# Patient Record
Sex: Male | Born: 1940 | Race: White | Hispanic: No | Smoking: Never smoker
Health system: Southern US, Community
[De-identification: ages and names within clinical notes are randomized; demographics above are authoritative.]

## PROBLEM LIST (undated history)

## (undated) DIAGNOSIS — C9110 Chronic lymphocytic leukemia of B-cell type not having achieved remission: Secondary | ICD-10-CM

## (undated) DIAGNOSIS — I639 Cerebral infarction, unspecified: Secondary | ICD-10-CM

## (undated) DIAGNOSIS — E785 Hyperlipidemia, unspecified: Secondary | ICD-10-CM

## (undated) DIAGNOSIS — M1990 Unspecified osteoarthritis, unspecified site: Secondary | ICD-10-CM

## (undated) DIAGNOSIS — Z86010 Personal history of colonic polyps: Secondary | ICD-10-CM

## (undated) DIAGNOSIS — R600 Localized edema: Secondary | ICD-10-CM

## (undated) DIAGNOSIS — N189 Chronic kidney disease, unspecified: Secondary | ICD-10-CM

## (undated) DIAGNOSIS — R7302 Impaired glucose tolerance (oral): Secondary | ICD-10-CM

## (undated) HISTORY — DX: Hyperlipidemia, unspecified: E78.5

## (undated) HISTORY — DX: Impaired glucose tolerance (oral): R73.02

## (undated) HISTORY — DX: Chronic lymphocytic leukemia of B-cell type not having achieved remission: C91.10

## (undated) HISTORY — DX: Localized edema: R60.0

## (undated) HISTORY — DX: Unspecified osteoarthritis, unspecified site: M19.90

## (undated) HISTORY — DX: Cerebral infarction, unspecified: I63.9

## (undated) HISTORY — PX: COLONOSCOPY W/ BIOPSIES: SHX1374

## (undated) HISTORY — DX: Chronic kidney disease, unspecified: N18.9

## (undated) HISTORY — DX: Personal history of colonic polyps: Z86.010

---

## 2000-09-01 DIAGNOSIS — C9110 Chronic lymphocytic leukemia of B-cell type not having achieved remission: Secondary | ICD-10-CM

## 2000-09-01 HISTORY — DX: Chronic lymphocytic leukemia of B-cell type not having achieved remission: C91.10

## 2001-04-15 ENCOUNTER — Other Ambulatory Visit: Admission: RE | Admit: 2001-04-15 | Discharge: 2001-04-15 | Payer: Self-pay | Admitting: Oncology

## 2004-07-07 ENCOUNTER — Ambulatory Visit: Payer: Self-pay | Admitting: Oncology

## 2005-02-06 ENCOUNTER — Ambulatory Visit: Payer: Self-pay | Admitting: Oncology

## 2005-02-07 ENCOUNTER — Other Ambulatory Visit: Admission: RE | Admit: 2005-02-07 | Discharge: 2005-02-07 | Payer: Self-pay | Admitting: Oncology

## 2005-07-31 ENCOUNTER — Ambulatory Visit: Payer: Self-pay | Admitting: Oncology

## 2006-01-25 ENCOUNTER — Ambulatory Visit: Payer: Self-pay | Admitting: Oncology

## 2006-01-30 LAB — CBC WITH DIFFERENTIAL/PLATELET
Basophils Absolute: 0.1 10*3/uL (ref 0.0–0.1)
EOS%: 1.1 % (ref 0.0–7.0)
HCT: 46.3 % (ref 38.7–49.9)
HGB: 15.6 g/dL (ref 13.0–17.1)
LYMPH%: 76.4 % — ABNORMAL HIGH (ref 14.0–48.0)
MCH: 28.3 pg (ref 28.0–33.4)
MCV: 84 fL (ref 81.6–98.0)
MONO%: 4.4 % (ref 0.0–13.0)
NEUT%: 17.7 % — ABNORMAL LOW (ref 40.0–75.0)
Platelets: 188 10*3/uL (ref 145–400)

## 2006-02-03 LAB — COMPREHENSIVE METABOLIC PANEL
BUN: 18 mg/dL (ref 6–23)
CO2: 24 mEq/L (ref 19–32)
Glucose, Bld: 105 mg/dL — ABNORMAL HIGH (ref 70–99)
Sodium: 138 mEq/L (ref 135–145)
Total Bilirubin: 0.7 mg/dL (ref 0.3–1.2)
Total Protein: 6.4 g/dL (ref 6.0–8.3)

## 2006-02-03 LAB — PROTEIN ELECTROPHORESIS, SERUM
Alpha-2-Globulin: 10 % (ref 7.1–11.8)
Total Protein, Serum Electrophoresis: 6.4 g/dL (ref 6.0–8.3)

## 2006-02-03 LAB — LACTATE DEHYDROGENASE: LDH: 146 U/L (ref 94–250)

## 2006-07-29 ENCOUNTER — Ambulatory Visit: Payer: Self-pay | Admitting: Oncology

## 2006-07-31 LAB — ERYTHROCYTE SEDIMENTATION RATE: Sed Rate: 1 mm/hr (ref 0–20)

## 2006-07-31 LAB — CBC WITH DIFFERENTIAL/PLATELET
BASO%: 0.2 % (ref 0.0–2.0)
EOS%: 0.4 % (ref 0.0–7.0)
MCH: 28.5 pg (ref 28.0–33.4)
MCHC: 34.3 g/dL (ref 32.0–35.9)
NEUT%: 21.5 % — ABNORMAL LOW (ref 40.0–75.0)
RBC: 5.39 10*6/uL (ref 4.20–5.71)
RDW: 13.8 % (ref 11.2–14.6)
lymph#: 13.4 10*3/uL — ABNORMAL HIGH (ref 0.9–3.3)

## 2006-07-31 LAB — MORPHOLOGY: RBC Comments: NORMAL

## 2006-08-05 LAB — COMPREHENSIVE METABOLIC PANEL
Albumin: 4.2 g/dL (ref 3.5–5.2)
CO2: 24 mEq/L (ref 19–32)
Glucose, Bld: 128 mg/dL — ABNORMAL HIGH (ref 70–99)
Sodium: 139 mEq/L (ref 135–145)
Total Bilirubin: 0.7 mg/dL (ref 0.3–1.2)
Total Protein: 6.2 g/dL (ref 6.0–8.3)

## 2006-08-05 LAB — IMMUNOFIXATION ELECTROPHORESIS
IgM, Serum: 47 mg/dL — ABNORMAL LOW (ref 60–263)
Total Protein, Serum Electrophoresis: 6.2 g/dL (ref 6.0–8.3)

## 2006-08-05 LAB — LACTATE DEHYDROGENASE: LDH: 127 U/L (ref 94–250)

## 2007-02-03 ENCOUNTER — Ambulatory Visit: Payer: Self-pay | Admitting: Oncology

## 2007-02-05 LAB — CBC WITH DIFFERENTIAL/PLATELET
Eosinophils Absolute: 0.1 10*3/uL (ref 0.0–0.5)
MONO#: 0.7 10*3/uL (ref 0.1–0.9)
NEUT#: 4.1 10*3/uL (ref 1.5–6.5)
Platelets: 201 10*3/uL (ref 145–400)
RBC: 5.24 10*6/uL (ref 4.20–5.71)
RDW: 14.1 % (ref 11.2–14.6)
WBC: 18.8 10*3/uL — ABNORMAL HIGH (ref 4.0–10.0)

## 2007-02-05 LAB — MORPHOLOGY
PLT EST: ADEQUATE
RBC Comments: NORMAL

## 2007-02-09 LAB — COMPREHENSIVE METABOLIC PANEL
ALT: 18 U/L (ref 0–53)
Alkaline Phosphatase: 66 U/L (ref 39–117)
Sodium: 141 mEq/L (ref 135–145)
Total Bilirubin: 0.8 mg/dL (ref 0.3–1.2)
Total Protein: 6.5 g/dL (ref 6.0–8.3)

## 2007-02-09 LAB — PROTEIN ELECTROPHORESIS, SERUM
Beta 2: 2.9 % — ABNORMAL LOW (ref 3.2–6.5)
Beta Globulin: 6.1 % (ref 4.7–7.2)

## 2007-02-18 ENCOUNTER — Encounter: Payer: Self-pay | Admitting: Internal Medicine

## 2007-02-18 ENCOUNTER — Ambulatory Visit: Payer: Self-pay | Admitting: Internal Medicine

## 2007-02-18 DIAGNOSIS — E785 Hyperlipidemia, unspecified: Secondary | ICD-10-CM

## 2007-03-12 ENCOUNTER — Ambulatory Visit: Payer: Self-pay | Admitting: Internal Medicine

## 2007-03-26 ENCOUNTER — Encounter: Payer: Self-pay | Admitting: Internal Medicine

## 2007-03-26 ENCOUNTER — Ambulatory Visit: Payer: Self-pay | Admitting: Internal Medicine

## 2007-03-26 DIAGNOSIS — Z860100 Personal history of colon polyps, unspecified: Secondary | ICD-10-CM

## 2007-03-26 HISTORY — DX: Personal history of colonic polyps: Z86.010

## 2007-03-26 HISTORY — DX: Personal history of colon polyps, unspecified: Z86.0100

## 2007-08-27 ENCOUNTER — Ambulatory Visit: Payer: Self-pay | Admitting: Oncology

## 2007-08-31 LAB — CBC WITH DIFFERENTIAL/PLATELET
BASO%: 0.3 % (ref 0.0–2.0)
EOS%: 0.4 % (ref 0.0–7.0)
MCH: 28.8 pg (ref 28.0–33.4)
MCHC: 34.1 g/dL (ref 32.0–35.9)
MCV: 84.3 fL (ref 81.6–98.0)
MONO%: 2.6 % (ref 0.0–13.0)
RBC: 5.39 10*6/uL (ref 4.20–5.71)
RDW: 13.7 % (ref 11.2–14.6)

## 2007-08-31 LAB — COMPREHENSIVE METABOLIC PANEL
ALT: 35 U/L (ref 0–53)
Alkaline Phosphatase: 74 U/L (ref 39–117)
Creatinine, Ser: 1.33 mg/dL (ref 0.40–1.50)
Sodium: 142 mEq/L (ref 135–145)
Total Bilirubin: 0.7 mg/dL (ref 0.3–1.2)
Total Protein: 6.7 g/dL (ref 6.0–8.3)

## 2007-08-31 LAB — MORPHOLOGY: RBC Comments: NORMAL

## 2008-02-11 ENCOUNTER — Ambulatory Visit: Payer: Self-pay | Admitting: Internal Medicine

## 2008-02-11 LAB — CONVERTED CEMR LAB
Albumin: 3.8 g/dL (ref 3.5–5.2)
Alkaline Phosphatase: 64 units/L (ref 39–117)
Bilirubin, Direct: 0.1 mg/dL (ref 0.0–0.3)
Blood in Urine, dipstick: NEGATIVE
Eosinophils Absolute: 0.2 10*3/uL (ref 0.0–0.7)
GFR calc Af Amer: 78 mL/min
GFR calc non Af Amer: 64 mL/min
HCT: 44.9 % (ref 39.0–52.0)
HDL: 42.2 mg/dL (ref >39.0)
Ketones, urine, test strip: NEGATIVE
MCV: 86.6 fL (ref 78.0–100.0)
Monocytes Absolute: 1.6 10*3/uL — ABNORMAL HIGH (ref 0.1–1.0)
Nitrite: NEGATIVE
Platelets: 178 10*3/uL (ref 150–400)
Potassium: 4.2 meq/L (ref 3.5–5.1)
Protein, U semiquant: NEGATIVE
RDW: 13 % (ref 11.5–14.6)
Sodium: 142 meq/L (ref 135–145)
Specific Gravity, Urine: >=1.03
Total CHOL/HDL Ratio: 3.6
Triglycerides: 71 mg/dL (ref 0–149)
pH: 5

## 2008-02-18 ENCOUNTER — Ambulatory Visit: Payer: Self-pay | Admitting: Internal Medicine

## 2008-02-18 DIAGNOSIS — N400 Enlarged prostate without lower urinary tract symptoms: Secondary | ICD-10-CM

## 2008-02-18 DIAGNOSIS — C9110 Chronic lymphocytic leukemia of B-cell type not having achieved remission: Secondary | ICD-10-CM | POA: Insufficient documentation

## 2008-02-28 ENCOUNTER — Ambulatory Visit: Payer: Self-pay | Admitting: Oncology

## 2008-03-01 LAB — CBC & DIFF AND RETIC
Eosinophils Absolute: 0.1 10*3/uL (ref 0.0–0.5)
LYMPH%: 66.3 % — ABNORMAL HIGH (ref 14.0–48.0)
MCH: 28.9 pg (ref 28.0–33.4)
MCHC: 34.4 g/dL (ref 32.0–35.9)
MCV: 83.9 fL (ref 81.6–98.0)
MONO%: 3.3 % (ref 0.0–13.0)
NEUT#: 5.9 10*3/uL (ref 1.5–6.5)
Platelets: 196 10*3/uL (ref 145–400)
RBC: 5.48 10*6/uL (ref 4.20–5.71)
Retic %: 1.8 % (ref 0.7–2.3)

## 2008-03-06 LAB — IMMUNOFIXATION ELECTROPHORESIS
IgA: 100 mg/dL (ref 68–378)
IgG (Immunoglobin G), Serum: 953 mg/dL (ref 694–1618)
IgM, Serum: 49 mg/dL — ABNORMAL LOW (ref 60–263)
Total Protein, Serum Electrophoresis: 6.7 g/dL (ref 6.0–8.3)

## 2008-03-06 LAB — COMPREHENSIVE METABOLIC PANEL
Albumin: 4.4 g/dL (ref 3.5–5.2)
Alkaline Phosphatase: 70 U/L (ref 39–117)
BUN: 20 mg/dL (ref 6–23)
Calcium: 9.2 mg/dL (ref 8.4–10.5)
Chloride: 109 mEq/L (ref 96–112)
Glucose, Bld: 119 mg/dL — ABNORMAL HIGH (ref 70–99)
Potassium: 4.3 mEq/L (ref 3.5–5.3)

## 2008-04-03 ENCOUNTER — Encounter: Payer: Self-pay | Admitting: Internal Medicine

## 2008-06-06 ENCOUNTER — Encounter (INDEPENDENT_AMBULATORY_CARE_PROVIDER_SITE_OTHER): Payer: Self-pay

## 2008-09-27 ENCOUNTER — Ambulatory Visit: Payer: Self-pay | Admitting: Oncology

## 2008-09-29 LAB — CBC WITH DIFFERENTIAL/PLATELET
Basophils Absolute: 0 10*3/uL (ref 0.0–0.1)
EOS%: 0.6 % (ref 0.0–7.0)
HGB: 15.4 g/dL (ref 13.0–17.1)
MCH: 29.4 pg (ref 28.0–33.4)
NEUT#: 5.1 10*3/uL (ref 1.5–6.5)
RDW: 13.9 % (ref 11.2–14.6)
WBC: 16.3 10*3/uL — ABNORMAL HIGH (ref 4.0–10.0)
lymph#: 10.5 10*3/uL — ABNORMAL HIGH (ref 0.9–3.3)

## 2008-10-03 LAB — COMPREHENSIVE METABOLIC PANEL
ALT: 23 U/L (ref 0–53)
AST: 20 U/L (ref 0–37)
Albumin: 4.1 g/dL (ref 3.5–5.2)
BUN: 20 mg/dL (ref 6–23)
Calcium: 9 mg/dL (ref 8.4–10.5)
Chloride: 106 mEq/L (ref 96–112)
Potassium: 4.3 mEq/L (ref 3.5–5.3)

## 2008-10-03 LAB — PROTEIN ELECTROPHORESIS, SERUM
Beta 2: 3.3 % (ref 3.2–6.5)
Gamma Globulin: 12.7 % (ref 11.1–18.8)

## 2008-10-06 ENCOUNTER — Encounter: Payer: Self-pay | Admitting: Internal Medicine

## 2008-10-09 LAB — CBC & DIFF AND RETIC
Basophils Absolute: 0 10*3/uL (ref 0.0–0.1)
Eosinophils Absolute: 0.2 10*3/uL (ref 0.0–0.5)
HCT: 47.4 % (ref 38.7–49.9)
HGB: 16.2 g/dL (ref 13.0–17.1)
IRF: 0.37 (ref 0.070–0.380)
MCV: 85.8 fL (ref 81.6–98.0)
MONO%: 3.8 % (ref 0.0–13.0)
NEUT#: 3.8 10*3/uL (ref 1.5–6.5)
NEUT%: 21.2 % — ABNORMAL LOW (ref 40.0–75.0)
RDW: 13.8 % (ref 11.2–14.6)
RETIC #: 96.2 10*3/uL (ref 31.8–103.9)
lymph#: 13.4 10*3/uL — ABNORMAL HIGH (ref 0.9–3.3)

## 2008-10-09 LAB — COMPREHENSIVE METABOLIC PANEL
ALT: 22 U/L (ref 0–53)
BUN: 16 mg/dL (ref 6–23)
CO2: 25 mEq/L (ref 19–32)
Calcium: 9.4 mg/dL (ref 8.4–10.5)
Chloride: 106 mEq/L (ref 96–112)
Creatinine, Ser: 1.34 mg/dL (ref 0.40–1.50)
Glucose, Bld: 95 mg/dL (ref 70–99)

## 2008-10-09 LAB — LACTATE DEHYDROGENASE: LDH: 127 U/L (ref 94–250)

## 2008-10-11 LAB — CREATININE CLEARANCE, URINE, 24 HOUR
Creatinine Clearance: 117 mL/min (ref 75–125)
Urine Total Volume-CRCL: 1950 mL

## 2008-10-11 LAB — UIFE/LIGHT CHAINS/TP QN, 24-HR UR
Albumin, U: DETECTED
Alpha 1, Urine: DETECTED — AB
Alpha 2, Urine: DETECTED — AB
Free Kappa Lt Chains,Ur: 0.63 mg/dL (ref 0.04–1.51)
Free Kappa/Lambda Ratio: 12.6 ratio — ABNORMAL HIGH (ref 0.46–4.00)
Free Lambda Excretion/Day: 0.98 mg/d
Time: 24 hours
Total Protein, Urine-Ur/day: 21 mg/d (ref 10–140)
Total Protein, Urine: 1.1 mg/dL

## 2008-10-27 ENCOUNTER — Encounter: Payer: Self-pay | Admitting: Internal Medicine

## 2008-12-28 ENCOUNTER — Encounter: Payer: Self-pay | Admitting: Internal Medicine

## 2009-01-25 ENCOUNTER — Encounter: Payer: Self-pay | Admitting: Internal Medicine

## 2009-04-04 ENCOUNTER — Ambulatory Visit: Payer: Self-pay | Admitting: Oncology

## 2009-04-04 LAB — CBC & DIFF AND RETIC
Basophils Absolute: 0 10*3/uL (ref 0.0–0.1)
EOS%: 1.2 % (ref 0.0–7.0)
Eosinophils Absolute: 0.2 10*3/uL (ref 0.0–0.5)
HGB: 15.8 g/dL (ref 13.0–17.1)
MCH: 28.5 pg (ref 27.2–33.4)
MONO#: 0.9 10*3/uL (ref 0.1–0.9)
NEUT#: 3.4 10*3/uL (ref 1.5–6.5)
RDW: 13.8 % (ref 11.0–14.6)
WBC: 16.6 10*3/uL — ABNORMAL HIGH (ref 4.0–10.3)
lymph#: 12.1 10*3/uL — ABNORMAL HIGH (ref 0.9–3.3)

## 2009-04-04 LAB — RETICULOCYTES (CHCC): RBC.: 5.63 MIL/uL (ref 4.22–5.81)

## 2009-04-04 LAB — COMPREHENSIVE METABOLIC PANEL
ALT: 17 U/L (ref 0–53)
Albumin: 4.2 g/dL (ref 3.5–5.2)
CO2: 21 mEq/L (ref 19–32)
Potassium: 4.5 mEq/L (ref 3.5–5.3)
Sodium: 139 mEq/L (ref 135–145)
Total Bilirubin: 0.6 mg/dL (ref 0.3–1.2)
Total Protein: 6.6 g/dL (ref 6.0–8.3)

## 2009-04-04 LAB — LACTATE DEHYDROGENASE: LDH: 138 U/L (ref 94–250)

## 2009-04-04 LAB — CHCC SMEAR

## 2009-04-13 ENCOUNTER — Encounter: Payer: Self-pay | Admitting: Internal Medicine

## 2009-06-15 ENCOUNTER — Ambulatory Visit: Payer: Self-pay | Admitting: Family Medicine

## 2009-06-15 LAB — CONVERTED CEMR LAB
ALT: 24 units/L (ref 0–53)
AST: 25 units/L (ref 0–37)
Albumin: 4 g/dL (ref 3.5–5.2)
BUN: 16 mg/dL (ref 6–23)
Bilirubin Urine: NEGATIVE
Blood in Urine, dipstick: NEGATIVE
Cholesterol: 181 mg/dL (ref 0–200)
Eosinophils Relative: 1.6 % (ref 0.0–5.0)
GFR calc non Af Amer: 58.43 mL/min (ref >60)
Glucose, Bld: 128 mg/dL — ABNORMAL HIGH (ref 70–99)
Glucose, Urine, Semiquant: NEGATIVE
HCT: 45.8 % (ref 39.0–52.0)
Lymphocytes Relative: 73 % — ABNORMAL HIGH (ref 12.0–46.0)
Monocytes Relative: 4.3 % (ref 3.0–12.0)
Neutrophils Relative %: 21.1 % — ABNORMAL LOW (ref 43.0–77.0)
PSA: 1.72 ng/mL (ref 0.10–4.00)
Platelets: 173 10*3/uL (ref 150.0–400.0)
Potassium: 4.7 meq/L (ref 3.5–5.1)
Protein, U semiquant: NEGATIVE
TSH: 1.66 microintl units/mL (ref 0.35–5.50)
Testosterone: 439.5 ng/dL (ref 350.00–890.00)
Total Protein: 6.8 g/dL (ref 6.0–8.3)
VLDL: 14.8 mg/dL (ref 0.0–40.0)
WBC: 14.8 10*3/uL — ABNORMAL HIGH (ref 4.5–10.5)
pH: 6

## 2009-06-22 ENCOUNTER — Ambulatory Visit: Payer: Self-pay | Admitting: Internal Medicine

## 2009-06-22 DIAGNOSIS — R609 Edema, unspecified: Secondary | ICD-10-CM

## 2009-07-09 ENCOUNTER — Encounter: Payer: Self-pay | Admitting: Internal Medicine

## 2009-07-09 ENCOUNTER — Encounter (INDEPENDENT_AMBULATORY_CARE_PROVIDER_SITE_OTHER): Payer: Self-pay | Admitting: *Deleted

## 2009-10-24 ENCOUNTER — Ambulatory Visit: Payer: Self-pay | Admitting: Oncology

## 2009-10-26 LAB — CBC WITH DIFFERENTIAL/PLATELET
BASO%: 0.3 % (ref 0.0–2.0)
Basophils Absolute: 0.1 10*3/uL (ref 0.0–0.1)
EOS%: 1.5 % (ref 0.0–7.0)
HGB: 14.8 g/dL (ref 13.0–17.1)
MCH: 29.4 pg (ref 27.2–33.4)
MCHC: 34.1 g/dL (ref 32.0–36.0)
MCV: 86.4 fL (ref 79.3–98.0)
MONO%: 4.1 % (ref 0.0–14.0)
NEUT%: 22.1 % — ABNORMAL LOW (ref 39.0–75.0)
RDW: 13.9 % (ref 11.0–14.6)
lymph#: 10.9 10*3/uL — ABNORMAL HIGH (ref 0.9–3.3)

## 2009-10-26 LAB — COMPREHENSIVE METABOLIC PANEL
AST: 18 U/L (ref 0–37)
Albumin: 4.3 g/dL (ref 3.5–5.2)
Alkaline Phosphatase: 64 U/L (ref 39–117)
Chloride: 107 mEq/L (ref 96–112)
Potassium: 4.1 mEq/L (ref 3.5–5.3)
Sodium: 140 mEq/L (ref 135–145)
Total Protein: 6.4 g/dL (ref 6.0–8.3)

## 2009-11-02 ENCOUNTER — Encounter: Payer: Self-pay | Admitting: Internal Medicine

## 2010-01-30 ENCOUNTER — Encounter: Payer: Self-pay | Admitting: Internal Medicine

## 2010-02-15 ENCOUNTER — Encounter (INDEPENDENT_AMBULATORY_CARE_PROVIDER_SITE_OTHER): Payer: Self-pay | Admitting: *Deleted

## 2010-02-18 ENCOUNTER — Telehealth: Payer: Self-pay | Admitting: Internal Medicine

## 2010-05-15 ENCOUNTER — Ambulatory Visit: Payer: Self-pay | Admitting: Oncology

## 2010-05-17 LAB — COMPREHENSIVE METABOLIC PANEL
ALT: 16 U/L (ref 0–53)
AST: 18 U/L (ref 0–37)
Albumin: 3.9 g/dL (ref 3.5–5.2)
Alkaline Phosphatase: 73 U/L (ref 39–117)
BUN: 19 mg/dL (ref 6–23)
Calcium: 9.2 mg/dL (ref 8.4–10.5)
Chloride: 106 mEq/L (ref 96–112)
Potassium: 4.6 mEq/L (ref 3.5–5.3)
Sodium: 140 mEq/L (ref 135–145)

## 2010-05-17 LAB — CBC WITH DIFFERENTIAL/PLATELET
Basophils Absolute: 0 10*3/uL (ref 0.0–0.1)
Eosinophils Absolute: 0.2 10*3/uL (ref 0.0–0.5)
HCT: 46.1 % (ref 38.4–49.9)
HGB: 15.4 g/dL (ref 13.0–17.1)
LYMPH%: 64.9 % — ABNORMAL HIGH (ref 14.0–49.0)
MCHC: 33.5 g/dL (ref 32.0–36.0)
MONO#: 0.7 10*3/uL (ref 0.1–0.9)
NEUT#: 4.5 10*3/uL (ref 1.5–6.5)
NEUT%: 29.2 % — ABNORMAL LOW (ref 39.0–75.0)
Platelets: 193 10*3/uL (ref 140–400)
WBC: 15.5 10*3/uL — ABNORMAL HIGH (ref 4.0–10.3)
lymph#: 10.1 10*3/uL — ABNORMAL HIGH (ref 0.9–3.3)

## 2010-05-17 LAB — MORPHOLOGY: PLT EST: ADEQUATE

## 2010-06-21 ENCOUNTER — Ambulatory Visit: Payer: Self-pay | Admitting: Internal Medicine

## 2010-06-21 LAB — CONVERTED CEMR LAB
AST: 21 units/L (ref 0–37)
Albumin: 3.9 g/dL (ref 3.5–5.2)
Alkaline Phosphatase: 74 units/L (ref 39–117)
Basophils Relative: 0 % (ref 0–1)
Bilirubin, Direct: 0.2 mg/dL (ref 0.0–0.3)
CO2: 25 meq/L (ref 19–32)
Eosinophils Absolute: 0.2 10*3/uL (ref 0.0–0.7)
Eosinophils Relative: 2 % (ref 0–5)
GFR calc non Af Amer: 57.74 mL/min (ref >60)
Glucose, Bld: 101 mg/dL — ABNORMAL HIGH (ref 70–99)
Glucose, Urine, Semiquant: NEGATIVE
HCT: 47.3 % (ref 39.0–52.0)
Lymphs Abs: 9.6 10*3/uL — ABNORMAL HIGH (ref 0.7–4.0)
MCHC: 33.2 g/dL (ref 30.0–36.0)
MCV: 83.6 fL (ref 78.0–100.0)
Monocytes Absolute: 0.6 10*3/uL (ref 0.1–1.0)
Monocytes Relative: 5 % (ref 3–12)
Neutrophils Relative %: 25 % — ABNORMAL LOW (ref 43–77)
Nitrite: NEGATIVE
Potassium: 4.1 meq/L (ref 3.5–5.1)
RBC: 5.66 M/uL (ref 4.22–5.81)
Sodium: 140 meq/L (ref 135–145)
Specific Gravity, Urine: 1.02
Total CHOL/HDL Ratio: 4
WBC Urine, dipstick: NEGATIVE
WBC: 13.9 10*3/uL — ABNORMAL HIGH (ref 4.0–10.5)
pH: 5.5

## 2010-06-28 ENCOUNTER — Ambulatory Visit: Payer: Self-pay | Admitting: Internal Medicine

## 2010-06-28 ENCOUNTER — Encounter: Payer: Self-pay | Admitting: Internal Medicine

## 2010-07-03 ENCOUNTER — Ambulatory Visit: Payer: Self-pay | Admitting: Oncology

## 2010-07-05 ENCOUNTER — Encounter: Payer: Self-pay | Admitting: Internal Medicine

## 2010-07-22 ENCOUNTER — Telehealth: Payer: Self-pay | Admitting: Internal Medicine

## 2010-08-13 ENCOUNTER — Encounter: Payer: Self-pay | Admitting: Internal Medicine

## 2010-08-22 ENCOUNTER — Encounter: Payer: Self-pay | Admitting: Internal Medicine

## 2010-09-20 ENCOUNTER — Encounter (INDEPENDENT_AMBULATORY_CARE_PROVIDER_SITE_OTHER): Payer: Self-pay | Admitting: *Deleted

## 2010-09-27 ENCOUNTER — Encounter (INDEPENDENT_AMBULATORY_CARE_PROVIDER_SITE_OTHER): Payer: Self-pay | Admitting: *Deleted

## 2010-09-30 ENCOUNTER — Ambulatory Visit
Admission: RE | Admit: 2010-09-30 | Discharge: 2010-09-30 | Payer: Self-pay | Source: Home / Self Care | Attending: Internal Medicine | Admitting: Internal Medicine

## 2010-10-01 NOTE — Letter (Signed)
Summary: Colonoscopy Letter  [facility]  [address] [place], [place] [address]   Phone: [phone]  Fax: [phone]      February 15, 2010 MRN: [id number]   Samuel Stephens [address] [place], [place]  [address]   Dear Mr. LAMA,   According to your medical record, it is time for you to schedule a Colonoscopy. The American Cancer Society recommends this procedure as a method to detect early colon cancer. Patients with a family history of colon cancer, or a personal history of colon polyps or inflammatory bowel disease are at increased risk.  This letter has beeen generated based on the recommendations made at the time of your procedure. If you feel that in your particular situation this may no longer apply, please contact our office.  Please call our office at [phone] to schedule this appointment or to update your records at your earliest convenience.  Thank you for cooperating with [place] to provide you with the very best care possible.   Sincerely,    Iva Boop, M.D.  [facility] Gastroenterology Division [phone]

## 2010-10-01 NOTE — Assessment & Plan Note (Signed)
Summary: cpx/njr   Vital Signs:  Patient profile:   70 year old male Height:      69 inches Weight:      205 pounds BMI:     30.38 Temp:     98.0 degrees F oral BP sitting:   124 / 72  (left arm) Cuff size:   large  Vitals Entered By: Alfred Levins, CMA (June 28, 2010 1:10 PM) CC: cpx   CC:  cpx.  History of Present Illness: 70 -year-old patient who is seen today for a wellness exam.  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  This has been clinically stable.  He has a history of mild impaired glucose tolerance, dyslipidemia.  He is on furosemide for pedal edema  Current Medications (verified): 1)  Adult Aspirin Ec Low Strength 81 Mg  Tbec (Aspirin) .Marland Kitchen.. 1 Once Daily 2)  Simvastatin 40 Mg  Tabs (Simvastatin) .Marland Kitchen.. 1 Once Daily 3)  Furosemide 40 Mg Tabs (Furosemide) .... One Daily, As Needed For Fluid Control 4)  Viagra 100 Mg Tabs (Sildenafil Citrate) .... One Daily As Directed  Allergies (verified): No Known Drug Allergies  Past History:  Past Medical History: Hyperlipidemia CLL: dx '02 Benign prostatic hypertrophy-mild pedal edema impaired glucose tolerance CKD  Colonic polyps, hx of  Past Surgical History: Reviewed history from 06/22/2009 and no changes required. none colonoscopy 2008  Family History: Reviewed history from 06/22/2009 and no changes required. F deceased at 68: CVA,tobacco, DM M deceased at 33: CVA, uterine and thyroid ca 4 B & 2 S: + hypercholesterolemia; one sister died complications of Rheumatic valvular disease  Family History Diabetes 1st degree relative Family History of Stroke F 1st degree relative <60 Family History Thyroid disease Family History Uterine cancer  Social History: Reviewed history from 06/22/2009 and no changes required. 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  The patient denies  anorexia, fever, weight loss, weight gain, vision loss, decreased hearing, hoarseness, chest pain, syncope, dyspnea on exertion, prolonged cough, headaches, hemoptysis, abdominal pain, melena, hematochezia, severe indigestion/heartburn, hematuria, incontinence, genital sores, muscle weakness, suspicious skin lesions, transient blindness, difficulty walking, depression, unusual weight change, abnormal bleeding, enlarged lymph nodes, angioedema, breast masses, and testicular masses.    Physical Exam  General:  Well-developed,well-nourished,in no acute distress; alert,appropriate and cooperative throughout examination Head:  Normocephalic and atraumatic without obvious abnormalities. No apparent alopecia or balding. Eyes:  No corneal or conjunctival inflammation noted. EOMI. Perrla. Funduscopic exam benign, without hemorrhages, exudates or papilledema. Vision grossly normal. Ears:  External ear exam shows no significant lesions or deformities.  Otoscopic examination reveals clear canals, tympanic membranes are intact bilaterally without bulging, retraction, inflammation or discharge. Hearing is grossly normal bilaterally. Nose:  External nasal examination shows no deformity or inflammation. Nasal mucosa are pink and moist without lesions or exudates. Mouth:  Oral mucosa and oropharynx without lesions or exudates.  Teeth in good repair. Neck:  No deformities, masses, or tenderness noted. Chest Wall:  No deformities, masses, tenderness or gynecomastia noted. Lungs:  Normal respiratory effort, chest expands symmetrically. Lungs are clear to auscultation, no crackles or wheezes. Heart:  Normal rate and regular rhythm. S1 and S2 normal without gallop, murmur, click, rub or other extra sounds. Abdomen:  Bowel sounds positive,abdomen soft and non-tender without masses, organomegaly or hernias noted. Rectal:  No external abnormalities noted. Normal  sphincter tone. No rectal masses or tenderness. Genitalia:   Testes bilaterally descended without nodularity, tenderness or masses. No scrotal masses or lesions. No penis lesions or urethral discharge. Prostate:  2+ enlarged.   Msk:  No deformity or scoliosis noted of thoracic or lumbar spine.   Pulses:  R and L carotid,radial,femoral,dorsalis pedis and posterior tibial pulses are full and equal bilaterally Extremities:  1+ left pedal edema and 1+ right pedal edema.   Neurologic:  No cranial nerve deficits noted. Station and gait are normal. Plantar reflexes are down-going bilaterally. DTRs are symmetrical throughout. Sensory, motor and coordinative functions appear intact. Skin:  Intact without suspicious lesions or rashes Cervical Nodes:  No lymphadenopathy noted Axillary Nodes:  No palpable lymphadenopathy Inguinal Nodes:  No significant adenopathy Psych:  Cognition and judgment appear intact. Alert and cooperative with normal attention span and concentration. No apparent delusions, illusions, hallucinations   Impression & Recommendations:  Problem # 1:  PHYSICAL EXAMINATION (ICD-V70.0)  Orders: Medicare -1st Annual Wellness Visit [phone])  Problem # 2:  PEDAL EDEMA (ICD-782.3)  His updated medication list for this problem includes:    Furosemide 40 Mg Tabs (Furosemide) ..... One daily, as needed for fluid control  Problem # 3:  COLONIC POLYPS, HX OF (ICD-V12.72)  Problem # 4:  HYPERLIPIDEMIA (ICD-272.4)  His updated medication list for this problem includes:    Simvastatin 40 Mg Tabs (Simvastatin) .Marland Kitchen... 1 once daily  Complete Medication List: 1)  Adult Aspirin Ec Low Strength 81 Mg Tbec (Aspirin) .Marland Kitchen.. 1 once daily 2)  Simvastatin 40 Mg Tabs (Simvastatin) .Marland Kitchen.. 1 once daily 3)  Furosemide 40 Mg Tabs (Furosemide) .... One daily, as needed for fluid control 4)  Viagra 100 Mg Tabs (Sildenafil citrate) .... One daily as directed  Patient Instructions: 1)  Please schedule a follow-up appointment in 1 year. 2)  Limit your Sodium (Salt). 3)   It is important that you exercise regularly at least 20 minutes 5 times a week. If you develop chest pain, have severe difficulty breathing, or feel very tired , stop exercising immediately and seek medical attention. 4)  Schedule a colonoscopy/sigmoidoscopy to help detect colon cancer. Prescriptions: VIAGRA 100 MG TABS (SILDENAFIL CITRATE) one daily as directed  #12 x 12   Entered and Authorized by:   Gordy Savers  MD   Signed by:   Gordy Savers  MD on 06/28/2010   Method used:   Print then Give to Patient   RxID:   [id number] FUROSEMIDE 40 MG TABS (FUROSEMIDE) one daily, as needed for fluid control  #90 x 6   Entered and Authorized by:   Gordy Savers  MD   Signed by:   Gordy Savers  MD on 06/28/2010   Method used:   Print then Give to Patient   RxID:   [id number] SIMVASTATIN 40 MG  TABS (SIMVASTATIN) 1 once daily  #90 x 6   Entered and Authorized by:   Gordy Savers  MD   Signed by:   Gordy Savers  MD on 06/28/2010   Method used:   Print then Give to Patient   RxID:   [id number]    Orders Added: 1)  Medicare -1st Annual Wellness Visit [G0438] 2)  Est. Patient Level III [31517]

## 2010-10-01 NOTE — Letter (Signed)
[facility]   Imported By: Maryln Gottron 12/13/2009 13:13:26  _____________________________________________________________________  External Attachment:    Type:   Image     Comment:   External Document

## 2010-10-01 NOTE — Progress Notes (Signed)
Summary: Diprolene ointment  Phone Note Call from Patient   Caller: Patient Call For: Gordy Savers  MD Summary of Call: Pt would like a RX for Diprolene for poison ivy [facility]) Initial call taken by: Lynann Beaver CMA,  February 18, 2010 2:19 PM  Follow-up for Phone Call        120  gm  rf 2 Follow-up by: Gordy Savers  MD,  February 18, 2010 3:24 PM    New/Updated Medications: DIPROLENE AF 0.05 % CREA (BETAMETHASONE DIPROPIONATE AUG) Apply as directed Prescriptions: DIPROLENE AF 0.05 % CREA (BETAMETHASONE DIPROPIONATE AUG) Apply as directed  #120 gm x 2   Entered by:   Lynann Beaver CMA   Authorized by:   Evelena Peat MD   Signed by:   Lynann Beaver CMA on 02/19/2010   Method used:   [id number]       [address]       [place], [place]  [address]       Ph: [phone] or [phone]       Fax: [phone]   RxID:   [id number]  Pt notified

## 2010-10-01 NOTE — Progress Notes (Signed)
Summary: urinary issues - rx   Phone Note Call from Patient Call back at [phone]   Caller: Patient---triage   vm Reason for Call: Talk to Nurse Complaint: Urinary/GYN Problems Summary of Call: started having burning sensations while urinating, with frequency. please return call.      [facility] Initial call taken by: Warnell Forester,  July 22, 2010 9:41 AM  Follow-up for Phone Call        spoke with pt - same signs as a few years ago - frequency , sm amt , burning - would like something called out. KIK Follow-up by: Duard Brady LPN,  July 22, 2010 11:37 AM  Additional Follow-up for Phone Call Additional follow up Details #1::        generic cipro  500  #14 one BID Additional Follow-up by: Gordy Savers  MD,  July 22, 2010 12:28 PM    Additional Follow-up for Phone Call Additional follow up Details #2::    pt aware -med list changes - [facility]   kik Follow-up by: Duard Brady LPN,  July 22, 2010 3:08 PM  New/Updated Medications: CIPRO 500 MG TABS (CIPROFLOXACIN HCL) 1 by mouth two times a day for 7 days Prescriptions: CIPRO 500 MG TABS (CIPROFLOXACIN HCL) 1 by mouth two times a day for 7 days  #14 x 0   Entered by:   Duard Brady LPN   Authorized by:   Gordy Savers  MD   Signed by:   Duard Brady LPN on 45/40/9811   Method used:   [facility]ed to ...       [facility]       [address]       [place], [place]  [address]       Ph: [phone] or [phone]       Fax: [phone]   RxID:   [id number]

## 2010-10-01 NOTE — Letter (Signed)
Summary: [facility]   Imported By: Maryln Gottron 02/18/2010 10:03:28  _____________________________________________________________________  External Attachment:    Type:   Image     Comment:   External Document

## 2010-10-01 NOTE — Letter (Signed)
Summary: [facility]  [facility]   Imported By: Maryln Gottron 02/18/2010 10:06:41  _____________________________________________________________________  External Attachment:    Type:   Image     Comment:   External Document

## 2010-10-01 NOTE — Letter (Signed)
Summary: [facility]  [facility]   Imported By: Maryln Gottron 07/23/2010 12:49:24  _____________________________________________________________________  External Attachment:    Type:   Image     Comment:   External Document

## 2010-10-03 NOTE — Letter (Signed)
Summary: Pre Visit Letter Revised  [facility]  [address] [place], [place] [address]   Phone: [phone]  Fax: [phone]        09/20/2010 MRN: [id number] Samuel Stephens [address] [place], [place]  [address]             Procedure Date:  10/14/2010 @ 1:30   Recall colon-Dr. Leone Payor      Welcome to the Gastroenterology Division at [facility]    You are scheduled to see a nurse for your pre-procedure visit on 09/30/2010 at 11:00 on the 3rd floor at [facility], [address]  We ask that you try to arrive at our office 15 minutes prior to your appointment time to allow for check-in.  Please take a minute to review the attached form.  If you answer "Yes" to one or more of the questions on the first page, we ask that you call the person listed at your earliest opportunity.  If you answer "No" to all of the questions, please complete the rest of the form and bring it to your appointment.    Your nurse visit will consist of discussing your medical and surgical history, your immediate family medical history, and your medications.   If you are unable to list all of your medications on the form, please bring the medication bottles to your appointment and we will list them.  We will need to be aware of both prescribed and over the counter drugs.  We will need to know exact dosage information as well.    Please be prepared to read and sign documents such as consent forms, a financial agreement, and acknowledgement forms.  If necessary, and with your consent, a friend or relative is welcome to sit-in on the nurse visit with you.  Please bring your insurance card so that we may make a copy of it.  If your insurance requires a referral to see a specialist, please bring your referral form from your primary care physician.  No co-pay is required for this nurse visit.     If you cannot keep your appointment, please call [phone] to cancel or reschedule prior  to your appointment date.  This allows [place] the opportunity to schedule an appointment for another patient in need of care.    Thank you for choosing [facility] for your medical needs.  We appreciate the opportunity to care for you.  Please visit [place] at our website  to learn more about our practice.  Sincerely, The Gastroenterology Division

## 2010-10-03 NOTE — Letter (Signed)
Summary: [facility]   Imported By: Maryln Gottron 09/12/2010 09:54:34  _____________________________________________________________________  External Attachment:    Type:   Image     Comment:   External Document

## 2010-10-09 NOTE — Letter (Signed)
Summary: [facility] Instructions  [facility]  [address] [place], [place] [address]   Phone: [phone]  Fax: [phone]       Samuel Stephens    1940/11/08    MRN: [id number]        Procedure Day Dorna Bloom:  Duanne Limerick  10/14/10     Arrival Time:  12:30PM     Procedure Time:  1:30PM     Location of Procedure:                    _ X_  [facility] (4th Floor)  PREPARATION FOR COLONOSCOPY WITH MOVIPREP   Starting 5 days prior to your procedure 10/09/10 do not eat nuts, seeds, popcorn, corn, beans, peas,  salads, or any raw vegetables.  Do not take any fiber supplements (e.g. Metamucil, Citrucel, and Benefiber).  THE DAY BEFORE YOUR PROCEDURE         DATE: 10/13/10  DAY: SUNDAY  1.  Drink clear liquids the entire day-NO SOLID FOOD  2.  Do not drink anything colored red or purple.  Avoid juices with pulp.  No orange juice.  3.  Drink at least 64 oz. (8 glasses) of fluid/clear liquids during the day to prevent dehydration and help the prep work efficiently.  CLEAR LIQUIDS INCLUDE: Water Jello Ice Popsicles Tea (sugar ok, no milk/cream) Powdered fruit flavored drinks Coffee (sugar ok, no milk/cream) Gatorade Juice: apple, white grape, white cranberry  Lemonade Clear bullion, consomm, broth Carbonated beverages (any kind) Strained chicken noodle soup Hard Candy                             4.  In the morning, mix first dose of MoviPrep solution:    Empty 1 Pouch A and 1 Pouch B into the disposable container    Add lukewarm drinking water to the top line of the container. Mix to dissolve    Refrigerate (mixed solution should be used within 24 hrs)  5.  Begin drinking the prep at 5:00 p.m. The MoviPrep container is divided by 4 marks.   Every 15 minutes drink the solution down to the next mark (approximately 8 oz) until the full liter is complete.   6.  Follow completed prep with 16 oz of clear liquid of your choice (Nothing red or purple).   Continue to drink clear liquids until bedtime.  7.  Before going to bed, mix second dose of MoviPrep solution:    Empty 1 Pouch A and 1 Pouch B into the disposable container    Add lukewarm drinking water to the top line of the container. Mix to dissolve    Refrigerate  THE DAY OF YOUR PROCEDURE      DATE: 10/14/10  DAY: MONDAY  Beginning at 8:30AM (5 hours before procedure):         1. Every 15 minutes, drink the solution down to the next mark (approx 8 oz) until the full liter is complete.  2. Follow completed prep with 16 oz. of clear liquid of your choice.    3. You may drink clear liquids until 11:30AM (2 HOURS BEFORE PROCEDURE).   MEDICATION INSTRUCTIONS  Unless otherwise instructed, you should take regular prescription medications with a small sip of water   as early as possible the morning of your procedure.           OTHER INSTRUCTIONS  You will need a responsible adult  at least 69 years of age to accompany you and drive you home.   This person must remain in the waiting room during your procedure.  Wear loose fitting clothing that is easily removed.  Leave jewelry and other valuables at home.  However, you may wish to bring a book to read or  an iPod/MP3 player to listen to music as you wait for your procedure to start.  Remove all body piercing jewelry and leave at home.  Total time from sign-in until discharge is approximately 2-3 hours.  You should go home directly after your procedure and rest.  You can resume normal activities the  day after your procedure.  The day of your procedure you should not:   Drive   Make legal decisions   Operate machinery   Drink alcohol   Return to work  You will receive specific instructions about eating, activities and medications before you leave.    The above instructions have been reviewed and explained to me by   Sherren Kerns RN  September 30, 2010 11:12 AM     I fully understand and can verbalize  these instructions _____________________________ Date _________

## 2010-10-09 NOTE — Miscellaneous (Signed)
Summary: previsit prep/rm  Clinical Lists Changes  Medications: Added new medication of MOVIPREP 100 GM  SOLR (PEG-KCL-NACL-NASULF-NA ASC-C) As per prep instructions. - Signed Rx of MOVIPREP 100 GM  SOLR (PEG-KCL-NACL-NASULF-NA ASC-C) As per prep instructions.;  #1 x 0;  Signed;  Entered by: Sherren Kerns RN;  Authorized by: Iva Boop MD, FACG;  Method used: [id number] [address], [place], [place]  [address], Ph: [phone] or [phone], Fax: [phone] Observations: Added new observation of ALLERGY REV: Done (09/30/2010 10:49)    Prescriptions: MOVIPREP 100 GM  SOLR (PEG-KCL-NACL-NASULF-NA ASC-C) As per prep instructions.  #1 x 0   Entered by:   Sherren Kerns RN   Authorized by:   Iva Boop MD, [facility]   Signed by:   Sherren Kerns RN on 09/30/2010   Method used:   Electronically to        [facility] [address]       [address]       [place], [place]  [address]       Ph: [phone] or [phone]       Fax: [phone]   RxID:   [id number]

## 2010-10-14 ENCOUNTER — Other Ambulatory Visit (AMBULATORY_SURGERY_CENTER): Payer: Medicare Other | Admitting: Internal Medicine

## 2010-10-14 ENCOUNTER — Other Ambulatory Visit: Payer: Self-pay | Admitting: Internal Medicine

## 2010-10-14 DIAGNOSIS — Z860100 Personal history of colon polyps, unspecified: Secondary | ICD-10-CM

## 2010-10-14 DIAGNOSIS — D126 Benign neoplasm of colon, unspecified: Secondary | ICD-10-CM

## 2010-10-14 DIAGNOSIS — K5730 Diverticulosis of large intestine without perforation or abscess without bleeding: Secondary | ICD-10-CM

## 2010-10-14 DIAGNOSIS — Z1211 Encounter for screening for malignant neoplasm of colon: Secondary | ICD-10-CM

## 2010-10-22 ENCOUNTER — Encounter: Payer: Self-pay | Admitting: Internal Medicine

## 2010-10-23 NOTE — Procedures (Addendum)
Summary: Colonoscopy  Patient: [name] Note: All result statuses are Final unless otherwise noted.  Tests: (1) Colonoscopy (COL)   COL Colonoscopy           DONE     [facility]     [address]     [place], [place]  [address]           COLONOSCOPY PROCEDURE REPORT           PATIENT:  Samuel Stephens, Samuel Stephens  MR#:  [id number]     BIRTHDATE:  Nov 21, 1940, 69 yrs. old  GENDER:  male     ENDOSCOPIST:  Iva Boop, MD, [facility]           PROCEDURE DATE:  10/14/2010     PROCEDURE:  Colonoscopy with biopsy and snare polypectomy     ASA CLASS:  Class II     INDICATIONS:  surveillance and high-risk screening, history of     pre-cancerous (adenomatous) colon polyps 3 adenomas removed     03/2007, max size 8mm     MEDICATIONS:   Fentanyl 50 mcg IV, Versed 6 mg IV           DESCRIPTION OF PROCEDURE:   After the risks benefits and     alternatives of the procedure were thoroughly explained, informed     consent was obtained.  Digital rectal exam was performed and     revealed an enlarged prostate and no rectal masses.   The LB     [id number] endoscope was introduced through the anus and     advanced to the cecum, which was identified by both the appendix     and ileocecal valve, without limitations.  The quality of the prep     was excellent, using MoviPrep.  The instrument was then slowly     withdrawn as the colon was fully examined. Insertion: 2:55 minutes     Withdrawal: 12:04 minutes     <<PROCEDUREIMAGES>>           FINDINGS:  Four polyps were found throughout the colon. Size range     1-2 mm up to 4 mm. Cold biopsy (cecum) and cold snare (left colon)     removal, all recovered.  Moderate diverticulosis was found in the     sigmoid colon.  This was otherwise a normal examination of the     colon.   Retroflexed views in the rectum revealed no     abnormalities.    The scope was then withdrawn from the patient     and the procedure completed.        COMPLICATIONS:  None     ENDOSCOPIC IMPRESSION:     1) Four diminutive polyps removed     2) Moderate diverticulosis in the sigmoid colon     3) Otherwise normal examination     4) Personal history of three adenomas removed in 03/2007 (max 8mm)                 REPEAT EXAM:  In for Colonoscopy, pending biopsy results.           Iva Boop, MD, [facility]           CC:  The Patient           n.     eSIGNED:   Iva Boop at 10/14/2010 02:17 PM           Meridee Score, [id number]  Note: An  exclamation mark (!) indicates a result that was not dispersed into the flowsheet. Document Creation Date: 10/14/2010 2:17 PM _______________________________________________________________________  (1) Order result status: Final Collection or observation date-time: 10/14/2010 14:06 Requested date-time:  Receipt date-time:  Reported date-time:  Referring Physician:   Ordering Physician: Stan Head [phone]) Specimen Source:  Source: Launa Grill Order Number: [id number] Lab site:   Appended Document: Colonoscopy     Procedures Next Due Date:    Colonoscopy: 10/2013

## 2010-10-29 NOTE — Letter (Signed)
Summary: Patient Notice- Polyp Results  [facility]  [address] [place], [place] [address]   Phone: [phone]  Fax: [phone]        October 22, 2010 MRN: [id number]    Samuel Stephens [address] [place], [place]  [address]    Dear [name],  The polyps removed from your colon were adenomatous. This means that they were pre-cancerous or that  they had the potential to change into cancer over time.   I recommend that you have a repeat colonoscopy in 3 years to determine if you have developed any new polyps over time and screen for colorectal cancer. If you develop any new rectal bleeding, abdominal pain or significant bowel habit changes, please contact us before then.  In addition to repeating colonoscopy, changing health habits may reduce your risk of having more colon or rectal  polyps and possibly, colorectal cancer. You may lower your risk of future polyps and colorectal cancer by adopting healthy habits such as not smoking or using tobacco (if you do), being physically active, losing weight (if overweight), and eating a diet which includes fruits and vegetables and limits red meat.  Please call us if you are having persistent problems or have questions about your condition that have not been fully answered at this time.  Sincerely,  Iva Boop MD, [facility]  This letter has been electronically signed by your physician.  Appended Document: Patient Notice- Polyp Results letter mailed

## 2011-04-04 ENCOUNTER — Encounter: Payer: Self-pay | Admitting: Internal Medicine

## 2011-04-04 ENCOUNTER — Ambulatory Visit (INDEPENDENT_AMBULATORY_CARE_PROVIDER_SITE_OTHER): Payer: Medicare Other | Admitting: Internal Medicine

## 2011-04-04 VITALS — BP 140/88 | Temp 98.0°F | Wt 206.0 lb

## 2011-04-04 DIAGNOSIS — J392 Other diseases of pharynx: Secondary | ICD-10-CM

## 2011-04-04 MED ORDER — PENICILLIN V POTASSIUM 500 MG PO TABS: 500.0000 mg | ORAL_TABLET | Freq: Four times a day (QID) | ORAL | Status: AC

## 2011-04-04 NOTE — Progress Notes (Signed)
  Subjective:    Patient ID: Samuel Stephens, male    DOB: 1940-12-25, 70 y.o.   MRN: [id number]  HPI  70 year old patient who has a long history of stable CLL who was seen by his dentist recently. It was suggested he be evaluated for a throat lesion. The patient has a small nodule involving the left posterior pharyngeal arch. The patient feels that he has had similar lesions in the past that have resolved. He describes a mild postnasal drip and sore throat. He is a lifelong nonsmoker   Review of Systems  Constitutional: Negative for fever, chills, appetite change and fatigue.  HENT: Positive for congestion and postnasal drip. Negative for hearing loss, ear pain, sore throat, trouble swallowing, neck stiffness, dental problem, voice change and tinnitus.   Eyes: Negative for pain, discharge and visual disturbance.  Respiratory: Negative for cough, chest tightness, wheezing and stridor.   Cardiovascular: Negative for chest pain, palpitations and leg swelling.  Gastrointestinal: Negative for nausea, vomiting, abdominal pain, diarrhea, constipation, blood in stool and abdominal distention.  Genitourinary: Negative for urgency, hematuria, flank pain, discharge, difficulty urinating and genital sores.  Musculoskeletal: Negative for myalgias, back pain, joint swelling, arthralgias and gait problem.  Skin: Negative for rash.  Neurological: Negative for dizziness, syncope, speech difficulty, weakness, numbness and headaches.  Hematological: Negative for adenopathy. Does not bruise/bleed easily.  Psychiatric/Behavioral: Negative for behavioral problems and dysphoric mood. The patient is not nervous/anxious.        Objective:   Physical Exam  Constitutional: He appears well-nourished. No distress.  HENT:  Head: Normocephalic.  Left Ear: External ear normal.  Nose: Nose normal.       There was an approximate 7 mm gray to slightly yellow smooth appearing nodule involving the left posterior  pharyngeal arch  Neck: Normal range of motion. Neck supple.  Lymphadenopathy:    He has no cervical adenopathy.          Assessment & Plan:   Benign appearing nodule left posterior pharyngeal arch. The patient treated with Pen-Vee K for 7 days. He has been asked return in 2 weeks for followup if this nodule persists. It is easily visible and if this nodule resolves in 2 weeks we'll see as scheduled for his annual physical in November

## 2011-04-04 NOTE — Patient Instructions (Signed)
Take your antibiotic as prescribed until ALL of it is gone, but stop if you develop a rash, swelling, or any side effects of the medication.  Contact our office as soon as possible if  there are side effects of the medication.  Return in two weeks for follow-up

## 2011-04-15 ENCOUNTER — Telehealth: Payer: Self-pay | Admitting: *Deleted

## 2011-04-15 DIAGNOSIS — J988 Other specified respiratory disorders: Secondary | ICD-10-CM

## 2011-04-15 NOTE — Telephone Encounter (Signed)
Cancel ROV and refer ENT

## 2011-04-15 NOTE — Telephone Encounter (Signed)
Notified pt. 

## 2011-04-15 NOTE — Telephone Encounter (Signed)
Still has throat nodule, and wants to know whether to come back Friday or go to ENT?

## 2011-04-18 ENCOUNTER — Ambulatory Visit: Payer: Medicare Other | Admitting: Internal Medicine

## 2011-06-10 ENCOUNTER — Encounter: Payer: Self-pay | Admitting: *Deleted

## 2011-07-02 ENCOUNTER — Other Ambulatory Visit: Payer: Self-pay | Admitting: Physician Assistant

## 2011-07-02 ENCOUNTER — Encounter (HOSPITAL_BASED_OUTPATIENT_CLINIC_OR_DEPARTMENT_OTHER): Payer: Medicare Other | Admitting: Oncology

## 2011-07-02 DIAGNOSIS — C9110 Chronic lymphocytic leukemia of B-cell type not having achieved remission: Secondary | ICD-10-CM

## 2011-07-02 LAB — CBC WITH DIFFERENTIAL/PLATELET
BASO%: 0.4 % (ref 0.0–2.0)
Basophils Absolute: 0.1 10*3/uL (ref 0.0–0.1)
EOS%: 0.6 % (ref 0.0–7.0)
HCT: 48.3 % (ref 38.4–49.9)
HGB: 16.1 g/dL (ref 13.0–17.1)
LYMPH%: 70.5 % — ABNORMAL HIGH (ref 14.0–49.0)
MCH: 28.6 pg (ref 27.2–33.4)
MCHC: 33.4 g/dL (ref 32.0–36.0)
MONO#: 0.7 10*3/uL (ref 0.1–0.9)
NEUT%: 24.5 % — ABNORMAL LOW (ref 39.0–75.0)
Platelets: 177 10*3/uL (ref 140–400)
lymph#: 12.5 10*3/uL — ABNORMAL HIGH (ref 0.9–3.3)

## 2011-07-02 LAB — COMPREHENSIVE METABOLIC PANEL
Albumin: 4.1 g/dL (ref 3.5–5.2)
BUN: 21 mg/dL (ref 6–23)
Calcium: 9.6 mg/dL (ref 8.4–10.5)
Chloride: 103 mEq/L (ref 96–112)
Creatinine, Ser: 1.33 mg/dL (ref 0.50–1.35)
Glucose, Bld: 101 mg/dL — ABNORMAL HIGH (ref 70–99)
Potassium: 4.2 mEq/L (ref 3.5–5.3)

## 2011-07-02 LAB — URINALYSIS, MICROSCOPIC - CHCC
Ketones: 5 mg/dL
Nitrite: NEGATIVE
Protein: NEGATIVE mg/dL
Specific Gravity, Urine: 1.03 (ref 1.003–1.035)
WBC, UA: NEGATIVE (ref 0–2)
pH: 5 (ref 4.6–8.0)

## 2011-07-02 LAB — PHOSPHORUS: Phosphorus: 3 mg/dL (ref 2.3–4.6)

## 2011-07-03 LAB — PROTEIN / CREATININE RATIO, URINE
Protein Creatinine Ratio: 0.02 (ref <0.15)
Total Protein, Urine: 5 mg/dL

## 2011-07-07 ENCOUNTER — Ambulatory Visit (HOSPITAL_BASED_OUTPATIENT_CLINIC_OR_DEPARTMENT_OTHER): Payer: Medicare Other | Admitting: Oncology

## 2011-07-07 ENCOUNTER — Other Ambulatory Visit: Payer: Self-pay | Admitting: Oncology

## 2011-07-07 VITALS — BP 162/85 | HR 83 | Temp 98.8°F | Ht 69.5 in | Wt 208.8 lb

## 2011-07-07 DIAGNOSIS — C9110 Chronic lymphocytic leukemia of B-cell type not having achieved remission: Secondary | ICD-10-CM

## 2011-07-07 DIAGNOSIS — N289 Disorder of kidney and ureter, unspecified: Secondary | ICD-10-CM

## 2011-07-07 NOTE — Progress Notes (Signed)
[facility] OFFICE PROGRESS NOTE  Samuel Boga, MD [address] [place] [address]    DIAGNOSIS:  1. CLL (chronic lymphoblastic leukemia)     CURRENT THERAPY:  INTERVAL HISTORY: Samuel Stephens 70 y.o. male returns for  Regular visit for followup of CLL  MEDICAL HISTORY: Past Medical History  Diagnosis Date  . Hyperlipidemia   . CLL (chronic lymphoblastic leukemia) 2002  . Pedal edema   . Glucose intolerance (impaired glucose tolerance)   . CKD (chronic kidney disease)   . Colonic polyp     INTERIM HISTORY: has LEUKEMIA, LYMPHOCYTIC, CHRONIC; HYPERLIPIDEMIA; BENIGN PROSTATIC HYPERTROPHY; PEDAL EDEMA; and COLONIC POLYPS, HX OF on his problem list.    ALLERGIES:   has no known allergies.  MEDICATIONS: Samuel Stephens does not currently have medications on file.  SURGICAL HISTORY: No past surgical history on file.  REVIEW OF SYSTEMS:   14 point review of systems negative apart from lower extremity edema  PHYSICAL EXAMINATION: ECOG PERFORMANCE STATUS: 0 - Asymptomatic  Filed Vitals:   07/07/11 1201  BP: 162/85  Pulse: 83  Temp: 98.8 F (37.1 C)    GENERAL:alert, healthy and no distress SKIN: skin color, texture, turgor are normal HEAD: Normocephalic EYES: PERRLA EARS: External ears normal OROPHARYNX:no exudate  NECK: no adenopathy LYMPH:  no palpable lymphadenopathy BREAST:not examined LUNGS: negative findings:  chest symmetric with normal A/P diameter HEART: regular rate & rhythm ABDOMEN:abdomen soft, non-tender, normal bowel sounds and no masses or organomegaly BACK: no skin lesions, erythema or scars EXTREMITIES:positive findings:  edema   NEURO: alert & oriented x 3 with fluent speech PELVIC: Deferred RECTAL: Deferred  LABORATORY DATA: CBC today  White count 17.7 hemoglobin 16.1, platelet count is 177  RADIOGRAPHIC STUDIES: No results found.   ASSESSMENT: Samuel Stephens is doing well, he is now 8 years out  from his initial diagnosis of CLL. There are been no other intercurrent problems apart from some minor renal insufficiency and lower extremity edema. He sees [name]  in the next few weeks. I plan to see him in one year's time. He knows to call should you have any concerns.    PLAN: We will see Samuel Stephens back in one year's time *All questions were answered. The patient knows to call the clinic with any problems, questions or concerns. We can certainly see the patient much sooner if necessary.    I spent 30 minutes counseling the patient face to face. The total time spent in the appointment was 15 minutes.

## 2011-07-08 ENCOUNTER — Telehealth: Payer: Self-pay | Admitting: Oncology

## 2011-07-08 NOTE — Telephone Encounter (Signed)
MD scheduled appts for nov2013.  Mailed appts to pt on 07/08/2011

## 2011-08-21 ENCOUNTER — Other Ambulatory Visit: Payer: Self-pay | Admitting: Internal Medicine

## 2011-11-10 ENCOUNTER — Encounter: Payer: Self-pay | Admitting: Internal Medicine

## 2011-11-10 ENCOUNTER — Ambulatory Visit (INDEPENDENT_AMBULATORY_CARE_PROVIDER_SITE_OTHER): Payer: Medicare Other | Admitting: Internal Medicine

## 2011-11-10 VITALS — BP 130/80 | HR 86 | Temp 97.9°F | Resp 16 | Ht 69.5 in | Wt 213.0 lb

## 2011-11-10 DIAGNOSIS — Z0000 Encounter for general adult medical examination without abnormal findings: Secondary | ICD-10-CM

## 2011-11-10 DIAGNOSIS — E785 Hyperlipidemia, unspecified: Secondary | ICD-10-CM

## 2011-11-10 DIAGNOSIS — C9110 Chronic lymphocytic leukemia of B-cell type not having achieved remission: Secondary | ICD-10-CM

## 2011-11-10 DIAGNOSIS — Z86010 Personal history of colonic polyps: Secondary | ICD-10-CM

## 2011-11-10 LAB — LIPID PANEL
Cholesterol: 189 mg/dL (ref 0–200)
HDL: 51.5 mg/dL (ref >39.00)
VLDL: 28.6 mg/dL (ref 0.0–40.0)

## 2011-11-10 MED ORDER — SILDENAFIL CITRATE 100 MG PO TABS: 100.0000 mg | ORAL_TABLET | Freq: Every day | ORAL | Status: DC | PRN

## 2011-11-10 MED ORDER — FUROSEMIDE 40 MG PO TABS: 40.0000 mg | ORAL_TABLET | Freq: Two times a day (BID) | ORAL | Status: DC

## 2011-11-10 MED ORDER — SIMVASTATIN 40 MG PO TABS: 40.0000 mg | ORAL_TABLET | Freq: Every day | ORAL | Status: DC

## 2011-11-10 NOTE — Progress Notes (Signed)
Subjective:    Patient ID: Samuel Stephens, male    DOB: 02-16-1941, 71 y.o.   MRN: [id number]  HPI  71 year-old patient who is seen today for a wellness exam. He is followed by hematology annually for stable CLL. He has a history of mild dyslipidemia BPH and a history of colonic polyps. He had followup colonoscopy in February of 2012. He feels quite well today no concerns or complaints. He still teaches and remains quite active. He denies any cardiopulmonary complaints.  Past Medical History  Diagnosis Date  . Hyperlipidemia   . CLL (chronic lymphoblastic leukemia) 2002  . Pedal edema   . Glucose intolerance (impaired glucose tolerance)   . CKD (chronic kidney disease)   . Colonic polyp     History   Social History  . Marital Status: Married    Spouse Name: N/A    Number of Children: N/A  . Years of Education: N/A   Occupational History  . Not on file.   Social History Main Topics  . Smoking status: Never Smoker   . Smokeless tobacco: Never Used  . Alcohol Use: Yes  . Drug Use: No  . Sexually Active: Not on file   Other Topics Concern  . Not on file   Social History Narrative  . No narrative on file    No past surgical history on file.  Family History  Problem Relation Age of Onset  . Stroke Mother   . Cancer Mother     uterine and thyroid  . Stroke Father   . Diabetes Father     No Known Allergies  Current Outpatient Prescriptions on File Prior to Visit  Medication Sig Dispense Refill  . aspirin 81 MG EC tablet Take 81 mg by mouth daily.        . fish oil-omega-3 fatty acids 1000 MG capsule Take 1 g by mouth daily.        . furosemide (LASIX) 40 MG tablet Take 40 mg by mouth 2 (two) times daily.        . sildenafil (VIAGRA) 100 MG tablet Take 100 mg by mouth daily as needed.        . simvastatin (ZOCOR) 40 MG tablet TAKE 1 TABLET EVERY DAY  90 tablet  0    BP 130/80  Pulse 86  Temp(Src) 97.9 F (36.6 C) (Oral)  Resp 16  Ht 5' 9.5" (1.765 m)   Wt 213 lb (96.616 kg)  BMI 31.00 kg/m2  SpO2 96%     Review of Systems  Constitutional: Negative for fever, chills, activity change, appetite change and fatigue.  HENT: Negative for hearing loss, ear pain, congestion, rhinorrhea, sneezing, mouth sores, trouble swallowing, neck pain, neck stiffness, dental problem, voice change, sinus pressure and tinnitus.   Eyes: Negative for photophobia, pain, redness and visual disturbance.  Respiratory: Negative for apnea, cough, choking, chest tightness, shortness of breath and wheezing.   Cardiovascular: Negative for chest pain, palpitations and leg swelling.  Gastrointestinal: Negative for nausea, vomiting, abdominal pain, diarrhea, constipation, blood in stool, abdominal distention, anal bleeding and rectal pain.  Genitourinary: Negative for dysuria, urgency, frequency, hematuria, flank pain, decreased urine volume, discharge, penile swelling, scrotal swelling, difficulty urinating, genital sores and testicular pain.  Musculoskeletal: Negative for myalgias, back pain, joint swelling, arthralgias and gait problem.  Skin: Negative for color change, rash and wound.  Neurological: Negative for dizziness, tremors, seizures, syncope, facial asymmetry, speech difficulty, weakness, light-headedness, numbness and headaches.  Hematological: Negative for  adenopathy. Does not bruise/bleed easily.  Psychiatric/Behavioral: Negative for suicidal ideas, hallucinations, behavioral problems, confusion, sleep disturbance, self-injury, dysphoric mood, decreased concentration and agitation. The patient is not nervous/anxious.        Objective:   Physical Exam  Constitutional: He appears well-developed and well-nourished.  HENT:  Head: Normocephalic and atraumatic.  Right Ear: External ear normal.  Left Ear: External ear normal.  Nose: Nose normal.  Mouth/Throat: Oropharynx is clear and moist.  Eyes: Conjunctivae and EOM are normal. Pupils are equal, round, and  reactive to light. No scleral icterus.  Neck: Normal range of motion. Neck supple. No JVD present. No thyromegaly present.  Cardiovascular: Regular rhythm, normal heart sounds and intact distal pulses.  Exam reveals no gallop and no friction rub.   No murmur heard.      The left dorsalis pedis pulse possibly diminished  Pulmonary/Chest: Effort normal and breath sounds normal. He exhibits no tenderness.  Abdominal: Soft. Bowel sounds are normal. He exhibits no distension and no mass. There is no tenderness.  Genitourinary: Prostate normal and penis normal.  Musculoskeletal: Normal range of motion. He exhibits no edema and no tenderness.  Lymphadenopathy:    He has no cervical adenopathy.  Neurological: He is alert. He has normal reflexes. No cranial nerve deficit. Coordination normal.  Skin: Skin is warm and dry. No rash noted.  Psychiatric: He has a normal mood and affect. His behavior is normal.          Assessment & Plan:   Preventive health examination CLL stable BPH mild and largely asymptomatic History of colonic polyps followup colonoscopy in 2017

## 2011-11-10 NOTE — Patient Instructions (Signed)
Limit your sodium (Salt) intake    It is important that you exercise regularly, at least 20 minutes 3 to 4 times per week.  If you develop chest pain or shortness of breath seek  medical attention.  Return in one year for follow-up   

## 2012-07-02 ENCOUNTER — Other Ambulatory Visit: Payer: Medicare Other

## 2012-07-07 ENCOUNTER — Telehealth: Payer: Self-pay | Admitting: *Deleted

## 2012-07-07 ENCOUNTER — Other Ambulatory Visit: Payer: Self-pay | Admitting: *Deleted

## 2012-07-07 DIAGNOSIS — C9110 Chronic lymphocytic leukemia of B-cell type not having achieved remission: Secondary | ICD-10-CM

## 2012-07-07 NOTE — Telephone Encounter (Signed)
Left voice message to inform the patient of the new date and time on 07-19-2012 starting at 9:00am

## 2012-07-08 ENCOUNTER — Telehealth: Payer: Self-pay | Admitting: *Deleted

## 2012-07-08 NOTE — Telephone Encounter (Signed)
Patient called in and confirmed over the phone the new date and time 

## 2012-07-09 ENCOUNTER — Other Ambulatory Visit: Payer: Medicare Other | Admitting: Lab

## 2012-07-09 ENCOUNTER — Ambulatory Visit: Payer: Medicare Other | Admitting: Oncology

## 2012-07-19 ENCOUNTER — Other Ambulatory Visit (HOSPITAL_BASED_OUTPATIENT_CLINIC_OR_DEPARTMENT_OTHER): Payer: Medicare Other | Admitting: Lab

## 2012-07-19 ENCOUNTER — Telehealth: Payer: Self-pay | Admitting: *Deleted

## 2012-07-19 ENCOUNTER — Ambulatory Visit (HOSPITAL_BASED_OUTPATIENT_CLINIC_OR_DEPARTMENT_OTHER): Payer: Medicare Other | Admitting: Oncology

## 2012-07-19 VITALS — BP 137/88 | HR 83 | Temp 97.4°F | Resp 20 | Ht 69.5 in | Wt 212.5 lb

## 2012-07-19 DIAGNOSIS — C9110 Chronic lymphocytic leukemia of B-cell type not having achieved remission: Secondary | ICD-10-CM

## 2012-07-19 LAB — COMPREHENSIVE METABOLIC PANEL (CC13)
CO2: 25 mEq/L (ref 22–29)
Calcium: 9.1 mg/dL (ref 8.4–10.4)
Chloride: 111 mEq/L — ABNORMAL HIGH (ref 98–107)
Creatinine: 1.2 mg/dL (ref 0.7–1.3)
Glucose: 114 mg/dl — ABNORMAL HIGH (ref 70–99)
Total Bilirubin: 0.99 mg/dL (ref 0.20–1.20)

## 2012-07-19 LAB — LACTATE DEHYDROGENASE (CC13): LDH: 178 U/L (ref 125–245)

## 2012-07-19 LAB — CBC WITH DIFFERENTIAL/PLATELET
Basophils Absolute: 0.1 10*3/uL (ref 0.0–0.1)
Eosinophils Absolute: 0.2 10*3/uL (ref 0.0–0.5)
HCT: 45.6 % (ref 38.4–49.9)
HGB: 15.2 g/dL (ref 13.0–17.1)
LYMPH%: 76.2 % — ABNORMAL HIGH (ref 14.0–49.0)
MCV: 85.4 fL (ref 79.3–98.0)
MONO%: 3.1 % (ref 0.0–14.0)
NEUT#: 3.1 10*3/uL (ref 1.5–6.5)
NEUT%: 19.1 % — ABNORMAL LOW (ref 39.0–75.0)
Platelets: 163 10*3/uL (ref 140–400)
RBC: 5.34 10*6/uL (ref 4.20–5.82)

## 2012-07-19 LAB — TECHNOLOGIST REVIEW

## 2012-07-19 NOTE — Progress Notes (Signed)
[facility] OFFICE PROGRESS NOTE  Samuel Boga, MD [address] [place] [address]    DIAGNOSIS:  No diagnosis found.  CURRENT THERAPY:  INTERVAL HISTORY: Samuel Stephens 71 y.o. male returns for  Regular visit for followup for stage 0 CLL now ninth your followup  MEDICAL HISTORY: Past Medical History  Diagnosis Date  . Hyperlipidemia   . CLL (chronic lymphoblastic leukemia) 2002  . Pedal edema   . Glucose intolerance (impaired glucose tolerance)   . CKD (chronic kidney disease)   . Colonic polyp     INTERIM HISTORY: has LEUKEMIA, LYMPHOCYTIC, CHRONIC; HYPERLIPIDEMIA; BENIGN PROSTATIC HYPERTROPHY; PEDAL EDEMA; and COLONIC POLYPS, HX OF on his problem list.    ALLERGIES:   has no known allergies.  MEDICATIONS: [name] does not currently have medications on file.  SURGICAL HISTORY: No past surgical history on file.  REVIEW OF SYSTEMS:   14 point review of systems negative apart from lower extremity edema  PHYSICAL EXAMINATION: ECOG PERFORMANCE STATUS: 0 - Asymptomatic  Filed Vitals:   07/19/12 0904  BP: 137/88  Pulse: 83  Temp: 97.4 F (36.3 C)  Resp: 20    GENERAL:alert, healthy and no distress SKIN: skin color, texture, turgor are normal HEAD: Normocephalic EYES: PERRLA EARS: External ears normal OROPHARYNX:no exudate  NECK: no adenopathy LYMPH:  no palpable lymphadenopathy BREAST:not examined LUNGS: negative findings:  chest symmetric with normal A/P diameter HEART: regular rate & rhythm ABDOMEN:abdomen soft, non-tender, normal bowel sounds and no masses or organomegaly BACK: no skin lesions, erythema or scars EXTREMITIES:positive findings:  edema   NEURO: alert & oriented x 3 with fluent speech PELVIC: Deferred RECTAL: Deferred  LABORATORY DATA: CBC today  Results for [name], [name] (MRN [id number]) as of 07/19/2012 09:47  Ref. Range 07/19/2012 08:50  WBC Latest Range: 4.0-10.5 10*3/microliter  16.3 (H)  RBC Latest Range: 4.22-5.81 M/uL 5.34  Hemoglobin Latest Range: 13.0-17.0 g/dL 19.1  HCT Latest Range: 39.0-52.0 % 45.6  MCV Latest Range: 78.0-100.0 fL 85.4  MCH Latest Range: 27.2-33.4 pg 28.4  MCHC Latest Range: 30.0-36.0 g/dL 47.8  RDW Latest Range: 11.5-15.5 % 13.9  Platelets Latest Range: 150-400 K/uL 163  NEUT% Latest Range: 39.0-75.0 % 19.1 (L)    RADIOGRAPHIC STUDIES: No results found.   ASSESSMENT: [name] is doing well, he is now 9 years out from his initial diagnosis of CLL. There are been no other intercurrent problems apart from some minor renal insufficiency and lower extremity edema. He sees [name]  in the next few weeks. I plan to see him in one year's time. He knows to call should you have any concerns.    PLAN: We will see [name] back in one year's time *All questions were answered. The patient knows to call the clinic with any problems, questions or concerns. We can certainly see the patient much sooner if necessary.    I spent 30 minutes counseling the patient face to face. The total time spent in the appointment was 15 minutes.

## 2012-07-19 NOTE — Telephone Encounter (Signed)
Gave patient appointment for 07-2013 

## 2012-09-14 ENCOUNTER — Emergency Department (HOSPITAL_COMMUNITY): Payer: Medicare Other

## 2012-09-14 ENCOUNTER — Encounter (HOSPITAL_COMMUNITY): Payer: Self-pay | Admitting: Emergency Medicine

## 2012-09-14 ENCOUNTER — Inpatient Hospital Stay (HOSPITAL_COMMUNITY)
Admission: EM | Admit: 2012-09-14 | Discharge: 2012-09-16 | DRG: 065 | Disposition: A | Discharge status: Home / Self Care | Payer: Medicare Other | Attending: Internal Medicine | Admitting: Internal Medicine

## 2012-09-14 DIAGNOSIS — I6340 Cerebral infarction due to embolism of unspecified cerebral artery: Principal | ICD-10-CM | POA: Diagnosis present

## 2012-09-14 DIAGNOSIS — C9110 Chronic lymphocytic leukemia of B-cell type not having achieved remission: Secondary | ICD-10-CM | POA: Diagnosis present

## 2012-09-14 DIAGNOSIS — Z823 Family history of stroke: Secondary | ICD-10-CM

## 2012-09-14 DIAGNOSIS — G459 Transient cerebral ischemic attack, unspecified: Secondary | ICD-10-CM | POA: Diagnosis present

## 2012-09-14 DIAGNOSIS — N400 Enlarged prostate without lower urinary tract symptoms: Secondary | ICD-10-CM

## 2012-09-14 DIAGNOSIS — Z86010 Personal history of colonic polyps: Secondary | ICD-10-CM

## 2012-09-14 DIAGNOSIS — Z833 Family history of diabetes mellitus: Secondary | ICD-10-CM

## 2012-09-14 DIAGNOSIS — Z7902 Long term (current) use of antithrombotics/antiplatelets: Secondary | ICD-10-CM

## 2012-09-14 DIAGNOSIS — N189 Chronic kidney disease, unspecified: Secondary | ICD-10-CM | POA: Diagnosis present

## 2012-09-14 DIAGNOSIS — R29810 Facial weakness: Secondary | ICD-10-CM | POA: Diagnosis present

## 2012-09-14 DIAGNOSIS — G8190 Hemiplegia, unspecified affecting unspecified side: Secondary | ICD-10-CM | POA: Diagnosis present

## 2012-09-14 DIAGNOSIS — I639 Cerebral infarction, unspecified: Secondary | ICD-10-CM | POA: Diagnosis present

## 2012-09-14 DIAGNOSIS — Z79899 Other long term (current) drug therapy: Secondary | ICD-10-CM

## 2012-09-14 DIAGNOSIS — R609 Edema, unspecified: Secondary | ICD-10-CM

## 2012-09-14 DIAGNOSIS — E785 Hyperlipidemia, unspecified: Secondary | ICD-10-CM

## 2012-09-14 LAB — CBC
HCT: 44.1 % (ref 39.0–52.0)
Hemoglobin: 15.4 g/dL (ref 13.0–17.0)
MCV: 82.9 fL (ref 78.0–100.0)
RBC: 5.32 MIL/uL (ref 4.22–5.81)
RDW: 13.5 % (ref 11.5–15.5)
WBC: 16 10*3/uL — ABNORMAL HIGH (ref 4.0–10.5)

## 2012-09-14 LAB — GLUCOSE, CAPILLARY: Glucose-Capillary: 132 mg/dL — ABNORMAL HIGH (ref 70–99)

## 2012-09-14 LAB — URINALYSIS, ROUTINE W REFLEX MICROSCOPIC
Glucose, UA: NEGATIVE mg/dL
Ketones, ur: NEGATIVE mg/dL
Leukocytes, UA: NEGATIVE
Nitrite: NEGATIVE
Specific Gravity, Urine: 1.017 (ref 1.005–1.030)
pH: 6 (ref 5.0–8.0)

## 2012-09-14 LAB — DIFFERENTIAL
Basophils Relative: 0 % (ref 0–1)
Eosinophils Relative: 1 % (ref 0–5)
Lymphs Abs: 10.4 10*3/uL — ABNORMAL HIGH (ref 0.7–4.0)
Monocytes Relative: 5 % (ref 3–12)
Neutro Abs: 4.6 10*3/uL (ref 1.7–7.7)

## 2012-09-14 LAB — POCT I-STAT, CHEM 8
Calcium, Ion: 1.15 mmol/L (ref 1.13–1.30)
Hemoglobin: 15.6 g/dL (ref 13.0–17.0)
Sodium: 141 mEq/L (ref 135–145)
TCO2: 22 mmol/L (ref 0–100)

## 2012-09-14 LAB — COMPREHENSIVE METABOLIC PANEL
Alkaline Phosphatase: 73 U/L (ref 39–117)
BUN: 20 mg/dL (ref 6–23)
CO2: 22 mEq/L (ref 19–32)
Chloride: 104 mEq/L (ref 96–112)
Creatinine, Ser: 1.19 mg/dL (ref 0.50–1.35)
GFR calc non Af Amer: 60 mL/min — ABNORMAL LOW (ref >90)
Glucose, Bld: 142 mg/dL — ABNORMAL HIGH (ref 70–99)
Total Bilirubin: 0.5 mg/dL (ref 0.3–1.2)

## 2012-09-14 LAB — TROPONIN I: Troponin I: <0.3 ng/mL (ref <0.30)

## 2012-09-14 MED ORDER — SIMVASTATIN 40 MG PO TABS
40.0000 mg | ORAL_TABLET | Freq: Every day | ORAL | Status: DC
  Administered 2012-09-15: 40 mg via ORAL
  Filled 2012-09-14 (×3): qty 1

## 2012-09-14 MED ORDER — ASPIRIN 325 MG PO TABS
325.0000 mg | ORAL_TABLET | Freq: Every day | ORAL | Status: DC
  Administered 2012-09-15: 325 mg via ORAL
  Filled 2012-09-14: qty 1

## 2012-09-14 MED ORDER — SODIUM CHLORIDE 0.9 % IV SOLN
INTRAVENOUS | Status: AC
  Administered 2012-09-15: 01:00:00 via INTRAVENOUS

## 2012-09-14 MED ORDER — ACETAMINOPHEN 325 MG PO TABS: 650.0000 mg | ORAL_TABLET | ORAL | Status: DC | PRN

## 2012-09-14 NOTE — Consult Note (Signed)
Referring Physician: Dr. Silverio Lay    Chief Complaint: Acute left hemiparesis  HPI: Samuel Stephens is an 72 y.o. male history of hyperlipidemia and glucose intolerance who experienced acute onset of weakness involving left face arm and leg this afternoon. Patient was last seen normal at 3 PM. When he woke up from a nap at around 5:00 the above deficits were present. There is no previous history of stroke nor TIA. Patient has been taking aspirin 81 mg per day. CT scan of his head showed no acute intracranial abnormality. Agents deficits resolved after arriving in the emergency room. NIH stroke score was 0.  LSN: 3 PM today tPA Given: No: Deficits rapidly resolved  MRankin: 0  Past Medical History  Diagnosis Date  . Hyperlipidemia   . CLL (chronic lymphoblastic leukemia) 2002  . Pedal edema   . Glucose intolerance (impaired glucose tolerance)   . CKD (chronic kidney disease)   . Colonic polyp     Family History  Problem Relation Age of Onset  . Stroke Mother   . Cancer Mother     uterine and thyroid  . Stroke Father   . Diabetes Father      Medications:  Prior to Admission:  Aspirin 81 mg per day Omega-3 fatty acid 1 g per day Lasix 40 mg twice a day Viagra 1 tablet 100 mg when necessary Zocor 40 mg at bedtime  Physical Examination: Blood pressure 160/83, pulse 90, temperature 98.1 F (36.7 C), temperature source Oral, SpO2 97.00%.  Neurologic Examination: Mental Status: Alert, oriented, thought content appropriate.  Speech fluent without evidence of aphasia. Able to follow commands without difficulty. Cranial Nerves: II-Visual fields were normal. III/IV/VI-Pupils were equal and reacted. Extraocular movements were full and conjugate.    V/VII-no facial numbness and no facial weakness. VIII-normal. X-normal speech and symmetrical palatal movement. XII-midline tongue extension Motor: 5/5 bilaterally with normal tone and bulk Sensory: Normal throughout. Deep Tendon  Reflexes: 2+ and symmetric. Plantars: Flexor bilaterally Cerebellar: Normal finger-to-nose testing. Carotid auscultation: Normal   Ct Head Wo Contrast  09/14/2012  *RADIOLOGY REPORT*  Clinical Data: Stroke symptoms, slurred speech, left weakness  CT HEAD WITHOUT CONTRAST  Technique:  Contiguous axial images were obtained from the base of the skull through the vertex without contrast.  Comparison: None.  Findings: Age-related brain atrophy noted.  No acute intracranial hemorrhage, definite acute infarction, focal edema, mass effect, sulcal effacement, midline shift, herniation, hydrocephalus, or extra-axial fluid collection.  Cisterns are patent.  No definite cerebellar abnormality.  No osseous abnormality.  IMPRESSION: Mild atrophy.  No acute finding by noncontrast CT.  Findings called to Dr. Roseanne Reno 09/14/2012, 5:45 p.m.   Original Report Authenticated By: Judie Petit. Miles Costain, M.D.     Assessment: 71 y.o. male presenting with transient left hemiparesis most likely manifestation of right subcortical TIA. Acute small vessel stroke cannot ruled out at this point however.  Stroke Risk Factors - family history and hyperlipidemia  Plan: 1. HgbA1c, fasting lipid panel 2. MRI, MRA  of the brain without contrast 3. PT consult, OT consult, Speech consult 4. Echocardiogram 5. Carotid dopplers 6. Prophylactic therapy-Antiplatelet med: Plavix 75 mg per day 7. Risk factor modification 8. Telemetry monitoring  C.R. Roseanne Reno, MD [facility] [phone]  09/14/2012, 6:24 PM

## 2012-09-14 NOTE — Significant Event (Signed)
Rapid Response Event Note  Overview: called to Code Stroke in ED. Arrived at 12.       Initial Focused Assessment: Patient arrived and seen by MD. LSN 1500, laid down for nap and awoke and stumbled out of bed, per EMT report. Airway cleared; escorted patient to CT scan. Neuro MD at bedside. Assisted back to ED 19. NIHSS completed at 1755 with score of 0. Interventions:    Event Summary:   at      at          Kristine Linea

## 2012-09-14 NOTE — H&P (Signed)
PCP:   Rogelia Boga, MD  Hematology: [facility] Chief Complaint:  Left side weakness.   HPI: Samuel Stephens is a 72 y.o. male   has a past medical history of Hyperlipidemia; Pedal edema; Glucose intolerance (impaired glucose tolerance); CKD (chronic kidney disease); Colonic polyp; and CLL (chronic lymphoblastic leukemia) (2002).   Presented with  At 3 PM he went for a nap. He started to have a cramp in the left leg he tried to walk it off but he noted that his left leg could not hold any wait. He crawled out of bed around 4 PM and called his wife. Once EMS arrived his left leg was very heavy. EMS also noted slurred speech and Left facial droop by the time he arrived to ER symptoms have improved. Code stroke was called but was canceled due to minimal symptoms at this point he is back to his baseline. CT of the head unremarkable. Hospitalist to admit for TIA work up.   Review of Systems:    Pertinent positives include: slurred speech, weakness,  Constitutional:  No weight loss, night sweats, Fevers, chills, fatigue, weight loss  HEENT:  No headaches, Difficulty swallowing,Tooth/dental problems,Sore throat,  No sneezing, itching, ear ache, nasal congestion, post nasal drip,  Cardio-vascular:  No chest pain, Orthopnea, PND, anasarca, dizziness, palpitations.no Bilateral lower extremity swelling  GI:  No heartburn, indigestion, abdominal pain, nausea, vomiting, diarrhea, change in bowel habits, loss of appetite, melena, blood in stool, hematemesis Resp:  no shortness of breath at rest. No dyspnea on exertion, No excess mucus, no productive cough, No non-productive cough, No coughing up of blood.No change in color of mucus.No wheezing. Skin:  no rash or lesions. No jaundice GU:  no dysuria, change in color of urine, no urgency or frequency. No straining to urinate.  No flank pain.  Musculoskeletal:  No joint pain or no joint swelling. No decreased range of  motion. No back pain.  Psych:  No change in mood or affect. No depression or anxiety. No memory loss.  Neuro:   no tingling, no  no double vision,  no  no confusion  Otherwise ROS are negative except for above, 10 systems were reviewed  Past Medical History: Past Medical History  Diagnosis Date  . Hyperlipidemia   . Pedal edema   . Glucose intolerance (impaired glucose tolerance)   . CKD (chronic kidney disease)   . Colonic polyp   . CLL (chronic lymphoblastic leukemia) 2002   History reviewed. No pertinent past surgical history.   Medications: Prior to Admission medications   Medication Sig Start Date End Date Taking? Authorizing Provider  aspirin 81 MG EC tablet Take 81 mg by mouth daily.      Historical Provider, MD  fish oil-omega-3 fatty acids 1000 MG capsule Take 1 g by mouth daily.      Historical Provider, MD  furosemide (LASIX) 40 MG tablet Take 40 mg by mouth daily as needed. 11/10/11   Gordy Savers, MD  simvastatin (ZOCOR) 40 MG tablet Take 1 tablet (40 mg total) by mouth at bedtime. 11/10/11   Gordy Savers, MD    Allergies:  No Known Allergies  Social History:  Ambulatory  Independently  Lives at   Home with wife   reports that he has never smoked. He has never used smokeless tobacco. He reports that he drinks alcohol. He reports that he does not use illicit drugs.   Family History: family history includes Cancer in his mother;  Diabetes in his father; and Stroke in his father and mother.    Physical Exam: Patient Vitals for the past 24 hrs:  BP Temp Temp src Pulse Resp SpO2  09/14/12 1937 149/77 mmHg - - 80  18  97 %  09/14/12 1842 - 98 F (36.7 C) - - - -  09/14/12 1802 160/83 mmHg 98.1 F (36.7 C) Oral 90  - 97 %  09/14/12 1729 - - - - - 99 %    1. General:  in No Acute distress 2. Psychological: Alert and   Oriented 3. Head/ENT:   Moist  Mucous Membranes                          Head Non traumatic, neck supple                           Normal   Dentition 4. SKIN: normal   Skin turgor,  Skin clean Dry and intact no rash 5. Heart: Regular rate and rhythm no Murmur, Rub or gallop 6. Lungs: Clear to auscultation bilaterally, no wheezes or crackles   7. Abdomen: Soft, non-tender, Non distended 8. Lower extremities: no clubbing, cyanosis, trace edema bilateraly 9. Neurologically: noted for mild left facial droop, otherwise cranial nerves intact and no nystagmus noted   strength 5 out of 5 in all 4 extremities  10. MSK: Normal range of motion  body mass index is unknown because there is no height or weight on file.   Labs on Admission:   [facility] 09/14/12 1747 09/14/12 1745  NA 141 138  K 4.0 4.2  CL 107 104  CO2 -- 22  GLUCOSE 136* 142*  BUN 19 20  CREATININE 1.20 1.19  CALCIUM -- 9.3  MG -- --  PHOS -- --    Basename 09/14/12 1745  AST 20  ALT 17  ALKPHOS 73  BILITOT 0.5  PROT 6.4  ALBUMIN 3.6   No results found for this basename: LIPASE:2,AMYLASE:2 in the last 72 hours  Basename 09/14/12 1747 09/14/12 1745  WBC -- 16.0*  NEUTROABS -- 4.6  HGB 15.6 15.4  HCT 46.0 44.1  MCV -- 82.9  PLT -- 173    Basename 09/14/12 1745  CKTOTAL --  CKMB --  CKMBINDEX --  TROPONINI <0.30   No results found for this basename: TSH,T4TOTAL,FREET3,T3FREE,THYROIDAB in the last 72 hours No results found for this basename: VITAMINB12:2,FOLATE:2,FERRITIN:2,TIBC:2,IRON:2,RETICCTPCT:2 in the last 72 hours No results found for this basename: HGBA1C    The CrCl is unknown because both a height and weight (above a minimum accepted value) are required for this calculation. ABG    Component Value Date/Time   TCO2 22 09/14/2012 1747     No results found for this basename: DDIMER     Other results:  I have pearsonaly reviewed this: ECG REPORT  Rate: 90  Rhythm: NSR ST&T Change: no ischemia  UA no evidence of infection   Cultures: No results found for this basename: sdes, specrequest, cult, reptstatus        Radiological Exams on Admission: Ct Head Wo Contrast  09/14/2012  *RADIOLOGY REPORT*  Clinical Data: Stroke symptoms, slurred speech, left weakness  CT HEAD WITHOUT CONTRAST  Technique:  Contiguous axial images were obtained from the base of the skull through the vertex without contrast.  Comparison: None.  Findings: Age-related brain atrophy noted.  No acute intracranial hemorrhage, definite acute infarction, focal  edema, mass effect, sulcal effacement, midline shift, herniation, hydrocephalus, or extra-axial fluid collection.  Cisterns are patent.  No definite cerebellar abnormality.  No osseous abnormality.  IMPRESSION: Mild atrophy.  No acute finding by noncontrast CT.  Findings called to Dr. Roseanne Reno 09/14/2012, 5:45 p.m.   Original Report Authenticated By: Judie Petit. Shick, M.D.    Dg Chest Portable 1 View  09/14/2012  *RADIOLOGY REPORT*  Clinical Data: Code stroke  PORTABLE CHEST - 1 VIEW  Comparison: None  Findings: Prominent heart size without heart failure.  Lungs are clear without infiltrate effusion or mass lesion.  IMPRESSION: No acute abnormality.   Original Report Authenticated By: Janeece Riggers, M.D.     Chart has been reviewed  Assessment/Plan  72 year old male here with transient left-sided weakness currently improved but the residual left facial droop likely secondary to TIA   Present on Admission:  . TIA (transient ischemic attack) -  - will admit based on TIA protocol, await results of MRA/MRI, Carotid Doppler and Echo, obtain cardiac enzymes,  ECG,  Lipid panel, TSH. Order PT/OT evaluation. Will make sure patient is on antiplatelet agent.   Marland Kitchen LEUKEMIA, LYMPHOCYTIC, CHRONIC - elevated white blood cell count at his baseline  . CKD (chronic kidney disease) - creatinine level normal limits currently.hold off on Lasix for today   Prophylaxis: SCD   CODE STATUS: FULL CODE  Other plan as per orders.  I have spent a total of 55  min on this admission  Kiondra Caicedo 09/14/2012,  8:03 PM

## 2012-09-14 NOTE — ED Notes (Signed)
Code stroke cancelled 

## 2012-09-14 NOTE — ED Provider Notes (Signed)
History     CSN: [id number]  Arrival date & time 09/14/12  1731   First MD Initiated Contact with Patient 09/14/12 1735      Chief Complaint  Patient presents with  . Code Stroke    (Consider location/radiation/quality/duration/timing/severity/associated sxs/prior treatment) The history is provided by the patient.  Samuel Stephens is a 72 y.o. male history of hyperlipidemia, CLL, CKD here presenting with strokelike symptoms. He went to bed around 3 PM this evening. Around 4 PM he slid out of bed and felt weak on the left side. As per EMS they noted that he has slurred speech and left-sided weakness that was resolving during transit. No hx of stroke in the past. No fever or chills or chest pain. Code stroke activated by EMS.    Past Medical History  Diagnosis Date  . Hyperlipidemia   . CLL (chronic lymphoblastic leukemia) 2002  . Pedal edema   . Glucose intolerance (impaired glucose tolerance)   . CKD (chronic kidney disease)   . Colonic polyp     History reviewed. No pertinent past surgical history.  Family History  Problem Relation Age of Onset  . Stroke Mother   . Cancer Mother     uterine and thyroid  . Stroke Father   . Diabetes Father     History  Substance Use Topics  . Smoking status: Never Smoker   . Smokeless tobacco: Never Used  . Alcohol Use: Yes     Comment: occassional/ a beer with every meal      Review of Systems  Neurological: Positive for speech difficulty and weakness.  All other systems reviewed and are negative.    Allergies  Review of patient's allergies indicates no known allergies.  Home Medications   Current Outpatient Rx  Name  Route  Sig  Dispense  Refill  . ASPIRIN 81 MG PO TBEC   Oral   Take 81 mg by mouth daily.           . OMEGA-3 FATTY ACIDS 1000 MG PO CAPS   Oral   Take 1 g by mouth daily.           . FUROSEMIDE 40 MG PO TABS   Oral   Take 1 tablet (40 mg total) by mouth 2 (two) times daily.   90 tablet   4   . SILDENAFIL CITRATE 100 MG PO TABS   Oral   Take 1 tablet (100 mg total) by mouth daily as needed.   10 tablet   6   . SIMVASTATIN 40 MG PO TABS   Oral   Take 1 tablet (40 mg total) by mouth at bedtime.   90 tablet   6     BP 160/83  Pulse 90  Temp 98 F (36.7 C) (Oral)  SpO2 97%  Physical Exam  Nursing note and vitals reviewed. Constitutional: He is oriented to person, place, and time. He appears well-developed and well-nourished.  HENT:  Head: Normocephalic.  Mouth/Throat: Oropharynx is clear and moist.       Slightly L facial droop.   Eyes: Conjunctivae normal are normal. Pupils are equal, round, and reactive to light.  Neck: Normal range of motion. Neck supple.  Cardiovascular: Normal rate, regular rhythm and normal heart sounds.   Pulmonary/Chest: Effort normal and breath sounds normal. No respiratory distress. He has no wheezes. He has no rales.  Abdominal: Soft. Bowel sounds are normal.  Musculoskeletal: Normal range of motion.  Neurological: He is alert  and oriented to person, place, and time.       CN intact except facial droop. Strength 5/5 throughout. ? L pronator drift. Cerebellar exam nl.   Skin: Skin is warm and dry.  Psychiatric: He has a normal mood and affect. His behavior is normal. Judgment and thought content normal.    ED Course  Procedures (including critical care time)  Labs Reviewed  CBC - Abnormal; Notable for the following:    WBC 16.0 (*)     All other components within normal limits  DIFFERENTIAL - Abnormal; Notable for the following:    Neutrophils Relative 29 (*)     Lymphocytes Relative 65 (*)     Lymphs Abs 10.4 (*)     All other components within normal limits  COMPREHENSIVE METABOLIC PANEL - Abnormal; Notable for the following:    Glucose, Bld 142 (*)     GFR calc non Af Amer 60 (*)     GFR calc Af Amer 70 (*)     All other components within normal limits  POCT I-STAT, CHEM 8 - Abnormal; Notable for the following:     Glucose, Bld 136 (*)     All other components within normal limits  GLUCOSE, CAPILLARY - Abnormal; Notable for the following:    Glucose-Capillary 132 (*)     All other components within normal limits  PROTIME-INR  APTT  TROPONIN I  POCT I-STAT TROPONIN I  URINALYSIS, ROUTINE W REFLEX MICROSCOPIC   Ct Head Wo Contrast  09/14/2012  *RADIOLOGY REPORT*  Clinical Data: Stroke symptoms, slurred speech, left weakness  CT HEAD WITHOUT CONTRAST  Technique:  Contiguous axial images were obtained from the base of the skull through the vertex without contrast.  Comparison: None.  Findings: Age-related brain atrophy noted.  No acute intracranial hemorrhage, definite acute infarction, focal edema, mass effect, sulcal effacement, midline shift, herniation, hydrocephalus, or extra-axial fluid collection.  Cisterns are patent.  No definite cerebellar abnormality.  No osseous abnormality.  IMPRESSION: Mild atrophy.  No acute finding by noncontrast CT.  Findings called to Dr. Roseanne Reno 09/14/2012, 5:45 p.m.   Original Report Authenticated By: Judie Petit. Shick, M.D.      1. TIA (transient ischemic attack)     Date: 09/14/2012  Rate: 90  Rhythm: normal sinus rhythm  QRS Axis: normal  Intervals: normal  ST/T Wave abnormalities: normal  Conduction Disutrbances:right bundle branch block  Narrative Interpretation:   Old EKG Reviewed: unchanged     MDM  Samuel Stephens is a 72 y.o. male here with L sided weakness and slurred speech, likely TIA. Neuro at bedside and he doesn't qualify for TPA. But recommend inpatient workup for TIA. I called hospitalist, who will admit the patient.          Richardean Canal, MD 09/14/12 [phone]

## 2012-09-14 NOTE — ED Notes (Signed)
[facility] presents with a 72 yo male from home presenting with stroke-like symptoms.  Pt. Reports seeing wife at or around 3 pm this evening and going to bed; pt reports getting out of bed around 3:45 pm to go to bathroom when he stood up and his left leg gave out on him/ he crawled to door to try to call his wife; his wife discovered him at approximately 4:10 pm and called EMS/ Fire Dept personnel reported to EMS that upon arrival pt. Had marked left sided weakness and slurred speech; upon arrival by EMS to the ED most of the stroke like symptoms had resolved.

## 2012-09-14 NOTE — ED Notes (Signed)
CBG=132 mg/dl

## 2012-09-15 ENCOUNTER — Observation Stay (HOSPITAL_COMMUNITY): Payer: Medicare Other

## 2012-09-15 DIAGNOSIS — G459 Transient cerebral ischemic attack, unspecified: Secondary | ICD-10-CM

## 2012-09-15 DIAGNOSIS — E785 Hyperlipidemia, unspecified: Secondary | ICD-10-CM

## 2012-09-15 DIAGNOSIS — I639 Cerebral infarction, unspecified: Secondary | ICD-10-CM | POA: Diagnosis present

## 2012-09-15 DIAGNOSIS — I6350 Cerebral infarction due to unspecified occlusion or stenosis of unspecified cerebral artery: Secondary | ICD-10-CM

## 2012-09-15 LAB — LIPID PANEL
Cholesterol: 172 mg/dL (ref 0–200)
HDL: 44 mg/dL (ref >39)

## 2012-09-15 LAB — GLUCOSE, CAPILLARY
Glucose-Capillary: 98 mg/dL (ref 70–99)
Glucose-Capillary: 122 mg/dL — ABNORMAL HIGH (ref 70–99)
Glucose-Capillary: 142 mg/dL — ABNORMAL HIGH (ref 70–99)

## 2012-09-15 LAB — HEMOGLOBIN A1C
Hgb A1c MFr Bld: 6 % — ABNORMAL HIGH (ref <5.7)
Mean Plasma Glucose: 126 mg/dL — ABNORMAL HIGH (ref <117)

## 2012-09-15 LAB — PATHOLOGIST SMEAR REVIEW

## 2012-09-15 MED ORDER — SIMVASTATIN 80 MG PO TABS
80.0000 mg | ORAL_TABLET | Freq: Every day | ORAL | Status: DC
  Administered 2012-09-15: 80 mg via ORAL
  Filled 2012-09-15 (×2): qty 1

## 2012-09-15 MED ORDER — SODIUM CHLORIDE 0.9 % IV SOLN
INTRAVENOUS | Status: DC
  Administered 2012-09-15: 21:00:00 via INTRAVENOUS
  Administered 2012-09-16: 500 mL via INTRAVENOUS

## 2012-09-15 MED ORDER — CLOPIDOGREL BISULFATE 75 MG PO TABS
75.0000 mg | ORAL_TABLET | Freq: Every day | ORAL | Status: DC
  Administered 2012-09-16: 75 mg via ORAL
  Filled 2012-09-15: qty 1

## 2012-09-15 NOTE — Progress Notes (Signed)
Stroke Team Progress Note  HISTORY Samuel Stephens is an 72 y.o. male history of hyperlipidemia and glucose intolerance who experienced acute onset of weakness involving left face arm and leg this afternoon. Patient was last seen normal at 3 PM 09/14/2012. When he woke up from a nap at around 5:00 the above deficits were present. There is no previous history of stroke nor TIA. Patient has been taking aspirin 81 mg per day. CT scan of his head showed no acute intracranial abnormality. Agents deficits resolved after arriving in the emergency room. NIH stroke score was 0. Patient was not a TPA candidate secondary to rapidly improving symptoms. He was admitted for further evaluation and treatment.  SUBJECTIVE His family is at the bedside.  Overall he feels his condition is unchanged.   OBJECTIVE Most recent Vital Signs: Filed Vitals:   09/14/12 2200 09/15/12 0138 09/15/12 0603 09/15/12 1058  BP: 141/88 121/76 126/75 136/82  Pulse: 80 73 75 81  Temp: 98 F (36.7 C) 97.8 F (36.6 C) 97.7 F (36.5 C) 97.8 F (36.6 C)  TempSrc: Oral Oral Oral Oral  Resp: 18 18 20 18   Height:      Weight:      SpO2: 97% 96% 97% 96%   CBG (last 3)   Basename 09/15/12 0139 09/14/12 1754  GLUCAP 98 132*   IV Fluid Intake:     MEDICATIONS    . aspirin  325 mg Oral Daily  . simvastatin  40 mg Oral QHS   PRN:  acetaminophen  Diet:  Cardiac thin liquids Activity:   Bathroom privileges with assistance DVT Prophylaxis:  SCDs   CLINICALLY SIGNIFICANT STUDIES Basic Metabolic Panel:  Lab 09/14/12 1610 09/14/12 1745  NA 141 138  K 4.0 4.2  CL 107 104  CO2 -- 22  GLUCOSE 136* 142*  BUN 19 20  CREATININE 1.20 1.19  CALCIUM -- 9.3  MG -- --  PHOS -- --   Liver Function Tests:  Lab 09/14/12 1745  AST 20  ALT 17  ALKPHOS 73  BILITOT 0.5  PROT 6.4  ALBUMIN 3.6   CBC:  Lab 09/14/12 1747 09/14/12 1745  WBC -- 16.0*  NEUTROABS -- 4.6  HGB 15.6 15.4  HCT 46.0 44.1  MCV -- 82.9  PLT -- 173    Coagulation:  Lab 09/14/12 1745  LABPROT 12.5  INR 0.94   Cardiac Enzymes:  Lab 09/14/12 1745  CKTOTAL --  CKMB --  CKMBINDEX --  TROPONINI <0.30   Urinalysis:  Lab 09/14/12 1846  COLORURINE YELLOW  LABSPEC 1.017  PHURINE 6.0  GLUCOSEU NEGATIVE  HGBUR NEGATIVE  BILIRUBINUR NEGATIVE  KETONESUR NEGATIVE  PROTEINUR NEGATIVE  UROBILINOGEN 0.2  NITRITE NEGATIVE  LEUKOCYTESUR NEGATIVE   Lipid Panel    Component Value Date/Time   CHOL 172 09/15/2012 0728   TRIG 121 09/15/2012 0728   HDL 44 09/15/2012 0728   CHOLHDL 3.9 09/15/2012 0728   VLDL 24 09/15/2012 0728   LDLCALC 104* 09/15/2012 0728   HgbA1C  No results found for this basename: HGBA1C    Urine Drug Screen:   No results found for this basename: labopia, cocainscrnur, labbenz, amphetmu, thcu, labbarb    Alcohol Level: No results found for this basename: ETH:2 in the last 168 hours  CT of the brain  09/14/2012   Mild atrophy.  No acute finding by noncontrast CT.    MRI of the brain  09/15/2012  Sub-centimeter acute infarction affecting the mesial temporal lobe on the  right.  Approximately 1 cm acute infarction affecting the posterior limb internal capsule/corona radiata on the right.    MRA of the brain  09/15/2012   No major vessel occlusion or correctable proximal stenosis.  Distal vessel atherosclerotic irregularity in the MCA territories bilaterally.    2D Echocardiogram    Carotid Doppler    CXR 09/14/2012   No acute abnormality.     EKG  normal sinus rhythm, RBBB.   Therapy Recommendations OP OT, ?OP PT  Physical Exam  Pleasant elderly Caucasian male not in distress.Awake alert oriented x 3 normal speech and language. Mild left lower face asymmetry. Tongue midline. No drift. Mild diminished fine finger movements on left. Orbits right over left upper extremity. Mild left grip weak.. Normal sensation . Normal coordination.  ASSESSMENT [name] is a 72 y.o. male presenting with transient left  hemiparesis. 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 Infarcts felt to be embolic secondary to unknown source.  Work up underway. On aspirin 81 mg orally every day prior to admission. Now on aspirin 325 mg orally every day for secondary stroke prevention. Patient with resultant mild left hand clusiness.  Hyperlipidemia, LDL 104, on statin (zocor 40) PTA and now, goal LDL < 100 CLL CKD  Hospital day # 1  TREATMENT/PLAN  Change aspirin to  clopidogrel 75 mg orally every day for secondary stroke prevention.  F/u 2D and carotid doppler TEE to look for embolic source. Arranged with [facility] for tomorrow.  If positive for PFO (patent foramen ovale), check bilateral lower extremity venous dopplers to rule out DVT as possible source of stroke. Please schedule outpatient telemetry monitoring to assess patient for atrial fibrillation as source of stroke. May be arranged with patient's cardiologist, or cardiologist of choice.   Annie Main, MSN, RN, ANVP-BC, ANP-BC, [facility] Pager: [phone] 09/15/2012 11:07 AM  I have personally obtained a history, examined the patient, evaluated imaging results, and formulated the assessment and plan of care. I agree with the above. Delia Heady, MD Medical Director [facility] Pager: [phone] 09/15/2012 5:25 PM

## 2012-09-15 NOTE — Progress Notes (Signed)
[facility] PROGRESS NOTE  Samuel Stephens JXB:[id number] DOB: 03-Feb-1941 DOA: 09/14/2012 PCP: Rogelia Boga, MD  Assessment/Plan: Principal Problem:  *TIA (transient ischemic attack) Active Problems:  LEUKEMIA, LYMPHOCYTIC, CHRONIC  CKD (chronic kidney disease)    1. Acute CVA: Patient presented with transient left lower extremity weakness. Head Ct scan showed mild atrophy, but no acute finding. Brain MRI on the other hand, revealed Sub-centimeter acute infarction affecting the mesial temporal lobe on the right, as well as an approximately 1 cm acute infarction affecting the posterior limb internal capsule/corona radiata on the right. MRA showed no major vessel occlusion or correctable proximal stenosis. Distal vessel atherosclerotic irregularity in the MCA territories bilaterally. Dr Noel Christmas provided initial neurology consultation, and patient was subsequently seen by Dr Delia Heady. The rest of CVA work up, including Carotid Doppler and 2D Echocardiogram are in progress, but Dr Pearlean Brownie has recommended TTE. Cardiology has been consulted accordingly. Lipid panel, TSH are pending, and patient is on Plavix for secondary stroke prevention, as he was on ASA,. Pre-admission.  2. CLL: Patient has a known history of chronic lymphocytic leukemia, diagnosed in 2002, and appears stable from this view point. Primary oncologist is Dr Pierce Crane. Wcc is elevated at 16.0, but at baseline. 3. CKD (chronic kidney disease): Creatinine is stable and at Baseline. Lasix is on hold.  4. Dyslipidemia: Lipid profile is as follows: TC 172, TC 121, HDL 44, LDL 104. In view of CVD, target LDL should be <70. Will increase Zocor to 80 mg daily.    Code Status: Full Code.  Family Communication:  Disposition Plan: To be determined.    Brief narrative: 72 y.o. male with history of Hyperlipidemia, impaired glucose tolerance); CKD, Colonic polyp; and CLL (chronic lymphoblastic leukemia) (2002).  At 3 PM on 09/14/12, he went for a nap, thought he had a cramp in the left leg, tried to walk it off but he noted that his left leg could not hold any weight. He crawled out of bed around 4 PM and called his wife. Once EMS arrived, his left leg was very heavy. EMS also noted slurred speech and Left facial droop but by the time he arrived to ER symptoms had improved. Code stroke was called but was canceled due to minimal symptoms. At the time of evaluation, by hospitalist, he was back to his baseline. CT of the head was unremarkable. Admit for TIA work up and further management.    Consultants:  Dr Noel Christmas, neurologist.   Procedures:  CXR.  Head Ct scan.   Antibiotics:  N/A.   HPI/Subjective: Asymptomatic.   Objective: Vital signs in last 24 hours: Temp:  [97.7 F (36.5 C)-98.1 F (36.7 C)] 97.7 F (36.5 C) (01/15 0603) Pulse Rate:  [73-90] 75  (01/15 0603) Resp:  [18-20] 20  (01/15 0603) BP: (121-160)/(75-88) 126/75 mmHg (01/15 0603) SpO2:  [96 %-99 %] 97 % (01/15 0603) Weight:  [96.3 kg (212 lb 4.9 oz)] 96.3 kg (212 lb 4.9 oz) (01/14 2124) Weight change:  Last BM Date: 09/14/12  Intake/Output from previous day: 01/14 0701 - 01/15 0700 In: -  Out: 650 [Urine:650]     Physical Exam: General: Comfortable, alert, communicative, fully oriented, not short of breath at rest.  HEENT:  No clinical pallor, no jaundice, no conjunctival injection or discharge. NECK:  Supple, JVP not seen, no carotid bruits, no palpable lymphadenopathy, no palpable goiter. CHEST:  Clinically clear to auscultation, no wheezes, no crackles. HEART:  Sounds 1 and 2  heard, normal, regular, no murmurs. ABDOMEN:  Full, soft, no scars, non-tender, no palpable organomegaly, no palpable masses, normal bowel sounds. GENITALIA:  Normal external genitalia. LOWER EXTREMITIES:  No pitting edema, palpable peripheral pulses. MUSCULOSKELETAL SYSTEM:  Generalized osteoarthritic changes, otherwise,  normal. CENTRAL NERVOUS SYSTEM:  No focal neurologic deficit on gross examination.  Lab Results:  Basename 09/14/12 1747 09/14/12 1745  WBC -- 16.0*  HGB 15.6 15.4  HCT 46.0 44.1  PLT -- 173    Basename 09/14/12 1747 09/14/12 1745  NA 141 138  K 4.0 4.2  CL 107 104  CO2 -- 22  GLUCOSE 136* 142*  BUN 19 20  CREATININE 1.20 1.19  CALCIUM -- 9.3   No results found for this or any previous visit (from the past 240 hour(s)).   Studies/Results: Ct Head Wo Contrast  09/14/2012  *RADIOLOGY REPORT*  Clinical Data: Stroke symptoms, slurred speech, left weakness  CT HEAD WITHOUT CONTRAST  Technique:  Contiguous axial images were obtained from the base of the skull through the vertex without contrast.  Comparison: None.  Findings: Age-related brain atrophy noted.  No acute intracranial hemorrhage, definite acute infarction, focal edema, mass effect, sulcal effacement, midline shift, herniation, hydrocephalus, or extra-axial fluid collection.  Cisterns are patent.  No definite cerebellar abnormality.  No osseous abnormality.  IMPRESSION: Mild atrophy.  No acute finding by noncontrast CT.  Findings called to Dr. Roseanne Reno 09/14/2012, 5:45 p.m.   Original Report Authenticated By: Judie Petit. Shick, M.D.    Dg Chest Portable 1 View  09/14/2012  *RADIOLOGY REPORT*  Clinical Data: Code stroke  PORTABLE CHEST - 1 VIEW  Comparison: None  Findings: Prominent heart size without heart failure.  Lungs are clear without infiltrate effusion or mass lesion.  IMPRESSION: No acute abnormality.   Original Report Authenticated By: Janeece Riggers, M.D.     Medications: Scheduled Meds:   . aspirin  325 mg Oral Daily  . simvastatin  40 mg Oral QHS   Continuous Infusions:  PRN Meds:.acetaminophen    LOS: 1 day   Ibrahima Holberg,CHRISTOPHER  [facility] Pager [phone]. If 8PM-8AM, please contact night-coverage at [email or web address], password [facility] 09/15/2012, 8:23 AM  LOS: 1 day

## 2012-09-15 NOTE — Discharge Summary (Signed)
Physician Discharge Summary  GRISELDA TOSH ZOX:[id number] DOB: [birth year] DOA: 09/14/2012  PCP: Rogelia Boga, MD  Admit date: 09/14/2012 Discharge date: 09/16/2012  Time spent: 40 minutes  Recommendations for Outpatient Follow-up:  1. Follow up with primary MD. 2. Follow up with Dr Delia Heady, neurologist.  3. Follow up with primary hematologist/oncologist.   Discharge Diagnoses:  Principal Problem:  *TIA (transient ischemic attack) Active Problems:  LEUKEMIA, LYMPHOCYTIC, CHRONIC  CKD (chronic kidney disease)  CVA (cerebral infarction)   Discharge Condition: Satisfactory.   Diet recommendation: Heart-Healthy.   Filed Weights   09/14/12 2124  Weight: 96.3 kg (212 lb 4.9 oz)    History of present illness:  72 y.o. male with history of Hyperlipidemia, impaired glucose tolerance); CKD, Colonic polyp; and CLL (chronic lymphoblastic leukemia) (2002). At 3 PM on 09/14/12, he went for a nap, thought he had a cramp in the left leg, tried to walk it off but he noted that his left leg could not hold any weight. He crawled out of bed around 4 PM and called his wife. Once EMS arrived, his left leg was very heavy. EMS also noted slurred speech and Left facial droop but by the time he arrived to ER symptoms had improved. Code stroke was called but was canceled due to minimal symptoms. At the time of evaluation, by hospitalist, he was back to his baseline. CT of the head was unremarkable. Admit for TIA work up and further management.    Hospital Course:  1. Acute CVA: Patient presented with transient left lower extremity weakness. Head CT scan showed mild atrophy, but no acute finding. Brain MRI on the other hand, revealed sub-centimeter acute infarction affecting the mesial temporal lobe on the right, as well as an approximately 1 cm acute infarction affecting the posterior limb internal capsule/corona radiata on the right. MRA showed no major vessel occlusion or correctable  proximal stenosis. Distal vessel atherosclerotic irregularity in the MCA territories bilaterally. Dr Noel Christmas provided initial neurology consultation, and patient was subsequently seen by Dr Delia Heady. Carotid Doppler showed no evidence of hemodynamically significant internal carotid artery stenosis. Vertebral artery flow was antegrade.  2D Echocardiogram showed normal LV cavity size was normal, EF of 55% to 60% and no regional wall motion Abnormalities. Dr Pearlean Brownie recommended TEE, which was performed by Dr Marca Ancona on 09/16/12 and showed normal LV size and systolic function, EF 60%. Normal RV size and systolic function. No PFO/ASD. No LA appendage thrombus. Mild plaque only in aorta. No source of embolus. Patient is now on Plavix for secondary stroke prevention, as he was on ASA, pre-admission.  2. CLL: Patient has a known history of chronic lymphocytic leukemia, diagnosed in 2002, and appears stable from this view point. Primary oncologist is Dr Pierce Crane. Wcc was elevated at 16.0, but at baseline. 3. CKD (chronic kidney disease): Creatinine is stable and at Baseline.  4. Dyslipidemia: Lipid profile is as follows: TC 172, TC 121, HDL 44, LDL 104. In view of CVD, target LDL should be <70. We have increased Zocor to 80 mg daily.    Procedures:  See Below.  2D echocardiogram.  TEE  Vascular Dopplers.   Consultations:  Dr Noel Christmas, neurologist.Dr Marca Ancona, cardiologist.   Discharge Exam: Filed Vitals:   09/16/12 1112 09/16/12 1120 09/16/12 1130 09/16/12 1131  BP: 134/74 134/74 111/71 124/77  Pulse:      Temp:      TempSrc: Oral     Resp: 17 28 12  17  Height:      Weight:      SpO2: 94% 94% 95% 94%    General: Comfortable, alert, communicative, fully oriented, not short of breath at rest.  HEENT: No clinical pallor, no jaundice, no conjunctival injection or discharge.  NECK: Supple, JVP not seen, no carotid bruits, no palpable lymphadenopathy, no palpable  goiter.  CHEST: Clinically clear to auscultation, no wheezes, no crackles.  HEART: Sounds 1 and 2 heard, normal, regular, no murmurs.  ABDOMEN: Full, soft, no scars, non-tender, no palpable organomegaly, no palpable masses, normal bowel sounds.  GENITALIA: Normal external genitalia.  LOWER EXTREMITIES: No pitting edema, palpable peripheral pulses.  MUSCULOSKELETAL SYSTEM: Generalized osteoarthritic changes, otherwise, normal.  CENTRAL NERVOUS SYSTEM: No focal neurologic deficit on gross examination.  Discharge Instructions      Discharge Orders    Future Appointments: Provider: Department: Dept Phone: Center:   07/22/2013 9:00 AM [facility] MEDICAL ONCOLOGY [phone] None   07/22/2013 9:30 AM Pierce Crane, MD [facility] MEDICAL ONCOLOGY [phone] None     Future Orders Please Complete By Expires   Diet - low sodium heart healthy      Increase activity slowly          Medication List     As of 09/16/2012 12:13 PM    STOP taking these medications         aspirin 81 MG EC tablet      TAKE these medications         clopidogrel 75 MG tablet   Commonly known as: PLAVIX   Take 1 tablet (75 mg total) by mouth daily with breakfast.      Fish Oil 1200 MG Caps   Take 1,200 mg by mouth every evening.      furosemide 40 MG tablet   Commonly known as: LASIX   Take 40 mg by mouth daily as needed. For fluid retention      simvastatin 40 MG tablet   Commonly known as: ZOCOR   Take 1 tablet (40 mg total) by mouth at bedtime.         Follow-up Information    Schedule an appointment as soon as possible for a visit with Rogelia Boga, MD.   Contact information:   [address] [place] [address] [phone]       Follow up with Gates Rigg, MD. Schedule an appointment as soon as possible for a visit in 2 months.   Contact information:   [address], [address] [facility] [place] [address] [phone]           The results of significant diagnostics from this hospitalization (including imaging, microbiology, ancillary and laboratory) are listed below for reference.    Significant Diagnostic Studies: Ct Head Wo Contrast  09/14/2012  *RADIOLOGY REPORT*  Clinical Data: Stroke symptoms, slurred speech, left weakness  CT HEAD WITHOUT CONTRAST  Technique:  Contiguous axial images were obtained from the base of the skull through the vertex without contrast.  Comparison: None.  Findings: Age-related brain atrophy noted.  No acute intracranial hemorrhage, definite acute infarction, focal edema, mass effect, sulcal effacement, midline shift, herniation, hydrocephalus, or extra-axial fluid collection.  Cisterns are patent.  No definite cerebellar abnormality.  No osseous abnormality.  IMPRESSION: Mild atrophy.  No acute finding by noncontrast CT.  Findings called to Dr. Roseanne Reno 09/14/2012, 5:45 p.m.   Original Report Authenticated By: Judie Petit. Miles Costain, M.D.    Mri  Brain Without Contrast  09/15/2012  *RADIOLOGY REPORT*  Clinical Data:  Weakness of the left leg.  Slurred speech.  MRI HEAD WITHOUT CONTRAST MRA HEAD WITHOUT CONTRAST  Technique:  Multiplanar, multiecho pulse sequences of the brain and surrounding structures were obtained without intravenous contrast. Angiographic images of the head were obtained using MRA technique without contrast.  Comparison:  Head CT 09/14/2012  MRI HEAD  Findings:  There is a 4 mm acute infarction within the mesial temporal lobe on the right and an approximately 1 cm region of acute infarction within the posterior limb internal capsule/corona radiata on the right.  No sign of hemorrhage or swelling. Elsewhere, the brain shows mild chronic small vessel change within the deep white matter.  No large vessel territory infarction.  No mass lesion, hemorrhage, hydrocephalus or extra-axial collection. No pituitary mass.  There is mild mucosal  inflammation of the paranasal sinuses most notable on the left frontal region.  IMPRESSION: Sub-centimeter acute infarction affecting the mesial temporal lobe on the right.  Approximately 1 cm acute infarction affecting the posterior limb internal capsule/corona radiata on the right.  MRA HEAD  Findings: Both internal carotid arteries are widely patent into the brain.  No siphon stenosis.  The anterior and middle cerebral vessels are patent without proximal stenosis, aneurysm or vascular malformation.  More distal branch vessels show mild atherosclerotic irregularity.  Both vertebral arteries are patent to the basilar.  No basilar stenosis.  Posterior circulation branch vessels are patent.  IMPRESSION: No major vessel occlusion or correctable proximal stenosis.  Distal vessel atherosclerotic irregularity in the MCA territories bilaterally.   Original Report Authenticated By: Paulina Fusi, M.D.    Dg Chest Portable 1 View  09/14/2012  *RADIOLOGY REPORT*  Clinical Data: Code stroke  PORTABLE CHEST - 1 VIEW  Comparison: None  Findings: Prominent heart size without heart failure.  Lungs are clear without infiltrate effusion or mass lesion.  IMPRESSION: No acute abnormality.   Original Report Authenticated By: Janeece Riggers, M.D.    Mr Mra Head/brain Wo Cm  09/15/2012  *RADIOLOGY REPORT*  Clinical Data:  Weakness of the left leg.  Slurred speech.  MRI HEAD WITHOUT CONTRAST MRA HEAD WITHOUT CONTRAST  Technique:  Multiplanar, multiecho pulse sequences of the brain and surrounding structures were obtained without intravenous contrast. Angiographic images of the head were obtained using MRA technique without contrast.  Comparison:  Head CT 09/14/2012  MRI HEAD  Findings:  There is a 4 mm acute infarction within the mesial temporal lobe on the right and an approximately 1 cm region of acute infarction within the posterior limb internal capsule/corona radiata on the right.  No sign of hemorrhage or swelling. Elsewhere, the  brain shows mild chronic small vessel change within the deep white matter.  No large vessel territory infarction.  No mass lesion, hemorrhage, hydrocephalus or extra-axial collection. No pituitary mass.  There is mild mucosal inflammation of the paranasal sinuses most notable on the left frontal region.  IMPRESSION: Sub-centimeter acute infarction affecting the mesial temporal lobe on the right.  Approximately 1 cm acute infarction affecting the posterior limb internal capsule/corona radiata on the right.  MRA HEAD  Findings: Both internal carotid arteries are widely patent into the brain.  No siphon stenosis.  The anterior and middle cerebral vessels are patent without proximal stenosis, aneurysm or vascular malformation.  More distal branch vessels show mild atherosclerotic irregularity.  Both vertebral arteries are patent to the basilar.  No basilar stenosis.  Posterior circulation branch  vessels are patent.  IMPRESSION: No major vessel occlusion or correctable proximal stenosis.  Distal vessel atherosclerotic irregularity in the MCA territories bilaterally.   Original Report Authenticated By: Paulina Fusi, M.D.     Microbiology: No results found for this or any previous visit (from the past 240 hour(s)).   Labs: Basic Metabolic Panel:  Lab 09/16/12 0981 09/14/12 1747 09/14/12 1745  NA 142 141 138  K 4.4 4.0 4.2  CL 105 107 104  CO2 26 -- 22  GLUCOSE 108* 136* 142*  BUN 16 19 20   CREATININE 1.18 1.20 1.19  CALCIUM 9.0 -- 9.3  MG -- -- --  PHOS -- -- --   Liver Function Tests:  Lab 09/14/12 1745  AST 20  ALT 17  ALKPHOS 73  BILITOT 0.5  PROT 6.4  ALBUMIN 3.6   No results found for this basename: LIPASE:5,AMYLASE:5 in the last 168 hours No results found for this basename: AMMONIA:5 in the last 168 hours CBC:  Lab 09/16/12 0545 09/14/12 1747 09/14/12 1745  WBC 13.6* -- 16.0*  NEUTROABS -- -- 4.6  HGB 15.2 15.6 15.4  HCT 44.7 46.0 44.1  MCV 83.9 -- 82.9  PLT 144* -- 173    Cardiac Enzymes:  Lab 09/14/12 1745  CKTOTAL --  CKMB --  CKMBINDEX --  TROPONINI <0.30   BNP: BNP (last 3 results) No results found for this basename: PROBNP:3 in the last 8760 hours CBG:  Lab 09/16/12 0635 09/15/12 2147 09/15/12 1639 09/15/12 1104 09/15/12 0139  GLUCAP 109* 142* 105* 122* 98       Signed:  Hara Milholland,CHRISTOPHER  [facility] 09/16/2012, 12:13 PM

## 2012-09-15 NOTE — Plan of Care (Signed)
Problem: Consults Goal: Stroke - Ischemic/TIA Patient Education See Patient Education Module for education specifics.  Outcome: Completed/Met Date Met:  09/15/12 Pt given materials from [facility] consults about stroke prevention, stroke therapy, and general stroke information.  Pt and RN signed copies given.  Problem: Phase II Progression Outcomes Goal: Neuro status stablized/improved Outcome: Completed/Met Date Met:  09/15/12 Pt CVA symptoms have all resolved. [name], Samuel Stephens     Goal: Tolerates increased mobility Outcome: Completed/Met Date Met:  09/15/12 Pt ambulates with 1 assist. Samuel Stephens Stephens

## 2012-09-15 NOTE — Evaluation (Signed)
Physical Therapy Evaluation Patient Details Name: Samuel Stephens MRN: [id number] DOB: Feb 23, 1941 Today's Date: 09/15/2012 Time: 4098-1191 PT Time Calculation (min): 38 min  PT Assessment / Plan / Recommendation Clinical Impression  Pt 72 yo male with suspected TIA presenting with mild R UE weakness and co-ordination, and balance deficits. Pt functioning near baseline. Pt with good support and home set up. May benefit from outpatient PT if still has minor deficts at neurology follow up.    PT Assessment  Patient needs continued PT services    Follow Up Recommendations  Outpatient PT;Supervision for mobility/OOB (if MD approves at follow up, symptoms may resolve by then and pt will not need outpt PT services    Does the patient have the potential to tolerate intense rehabilitation      Barriers to Discharge None      Equipment Recommendations  None recommended by PT    Recommendations for Other Services     Frequency Min 3X/week    Precautions / Restrictions Precautions Precautions: None Restrictions Weight Bearing Restrictions: No   010       Mobility  Bed Mobility Bed Mobility: Supine to Sit;Sitting - Scoot to Edge of Bed Supine to Sit: 6: Modified independent (Device/Increase time);HOB flat Sitting - Scoot to Edge of Bed: 6: Modified independent (Device/Increase time);With rail Details for Bed Mobility Assistance: increased time Transfers Transfers: Sit to Stand;Stand to Sit Sit to Stand: 6: Modified independent (Device/Increase time);With upper extremity assist;From bed Stand to Sit: With upper extremity assist;To chair/3-in-1 Details for Transfer Assistance: initially unsteady upon initial stand due to being in bed for a day but then became more stable t/o session Ambulation/Gait Ambulation/Gait Assistance: 5: Supervision Ambulation Distance (Feet): 400 Feet Assistive device: None Ambulation/Gait Assistance Details: no obvious LOB, pt initially unsteady but  improved t/o session. Pt with mild L LE stiffness Gait Pattern: Step-through pattern;Decreased stance time - left Gait velocity: decreased General Gait Details: overall WFL Stairs: Yes Stairs Assistance: 5: Supervision Stairs Assistance Details (indicate cue type and reason): no LOB Stair Management Technique: Two rails Number of Stairs: 2  Modified Rankin (Stroke Patients Only) Pre-Morbid Rankin Score: No symptoms Modified Rankin: No significant disability    Shoulder Instructions     Exercises     PT Diagnosis: Difficulty walking  PT Problem List: Decreased strength;Decreased balance PT Treatment Interventions: Gait training;Stair training;Balance training;Neuromuscular re-education   PT Goals Acute Rehab PT Goals PT Goal Formulation: With patient Time For Goal Achievement: 09/22/12 Potential to Achieve Goals: Good Pt will go Supine/Side to Sit: Independently;with HOB 0 degrees PT Goal: Supine/Side to Sit - Progress: Goal set today Pt will go Sit to Supine/Side: Independently;with HOB 0 degrees PT Goal: Sit to Supine/Side - Progress: Goal set today Pt will go Sit to Stand: Independently PT Goal: Sit to Stand - Progress: Goal set today Pt will Ambulate: >150 feet;Independently PT Goal: Ambulate - Progress: Goal set today Pt will Go Up / Down Stairs: 1-2 stairs;Independently (no rails.) PT Goal: Up/Down Stairs - Progress: Goal set today Additional Goals Additional Goal #1: Pt to score >52 on BERG to indicate low fall risk PT Goal: Additional Goal #1 - Progress: Goal set today  Visit Information  Last PT Received On: 09/15/12 Assistance Needed: +1    Subjective Data  Subjective: Pt received supine in bed, agreeable to PT.   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 Dominant Hand:  (ambidextrous)    Cognition  Overall Cognitive Status: Appears within functional limits for tasks assessed/performed Arousal/Alertness: Awake/alert Orientation Level: Oriented X4 / Intact Behavior During Session: WFL for tasks performed    Extremity/Trunk Assessment Right Upper Extremity Assessment RUE ROM/Strength/Tone: Within functional levels RUE Sensation: WFL - Light Touch RUE Coordination: WFL - gross/fine motor;WFL - gross motor Left Upper Extremity Assessment LUE ROM/Strength/Tone: Deficits (grossly) LUE ROM/Strength/Tone Deficits: L shld flex 4+/5 LUE Sensation: WFL - Light Touch LUE Coordination:  ( required increased concentra) Right Lower Extremity Assessment RLE ROM/Strength/Tone: Within functional levels RLE Coordination: WFL - gross/fine motor Left Lower Extremity Assessment LLE ROM/Strength/Tone: Within functional levels LLE Coordination: WFL - gross/fine motor Trunk Assessment Trunk Assessment: Normal   Balance Standardized Balance Assessment Standardized Balance Assessment: Dynamic Gait Index Dynamic Gait Index Level Surface: Normal Change in Gait Speed: Normal Gait with Horizontal Head Turns: Normal Gait with Vertical Head Turns: Normal Gait and Pivot Turn: Normal Step Over Obstacle: Mild Impairment Step Around Obstacles: Normal Steps: Mild Impairment Total Score: 22   End of Session PT - End of Session Equipment Utilized During Treatment: Gait belt Activity Tolerance: Patient tolerated treatment well Patient left: in chair;with call bell/phone within reach;with family/visitor present Nurse Communication: Mobility status  GP Functional Assessment Tool  Used: clinical judgement Functional Limitation: Mobility: Walking and moving around Mobility: Walking and Moving Around Current Status (Z6109): At least 1 percent but less than 20 percent impaired, limited or restricted Mobility: Walking and Moving Around Goal Status [phone]): 0 percent impaired, limited or restricted   Marcene Brawn 09/15/2012, 10:05 AM  Lewis Shock, PT, DPT Pager #: [phone] Office #: [phone]

## 2012-09-15 NOTE — Progress Notes (Signed)
  Echocardiogram 2D Echocardiogram has been performed.  Ellender Hose A 09/15/2012, 3:46 PM

## 2012-09-15 NOTE — Progress Notes (Signed)
Utilization review completed. Katie Moch, RN, BSN. 

## 2012-09-15 NOTE — Evaluation (Signed)
Occupational Therapy Evaluation Patient Details Name: Samuel Stephens MRN: [id number] DOB: 1941/06/08 Today's Date: 09/15/2012 Time: 1027-2536 OT Time Calculation (min): 19 min  OT Assessment / Plan / Recommendation Clinical Impression  Pt admitted wtih L side weakness, L facial droop and slurred speech.  TIA suspected, pt awaiting MRI.  Pt reports feeling back to baseline.  Noted some mild coordination deficits persisting on the L side.  Recommending OPOT.    OT Assessment  All further OT needs can be met in the next venue of care    Follow Up Recommendations  Outpatient OT    Barriers to Discharge      Equipment Recommendations  None recommended by OT    Recommendations for Other Services    Frequency       Precautions / Restrictions Precautions Precautions: None Restrictions Weight Bearing Restrictions: No   Pertinent Vitals/Pain No pain    ADL  Eating/Feeding: Independent Where Assessed - Eating/Feeding: Chair    OT Diagnosis:  (L side incoordination)  OT Problem List: Decreased coordination;Impaired balance (sitting and/or standing) OT Treatment Interventions:     OT Goals    Visit Information  Last OT Received On: 09/15/12 Assistance Needed: +1 PT/OT Co-Evaluation/Treatment: Yes    Subjective Data  Subjective: "My teacher made me do everything with my R hand, even though I am left handed." Patient Stated Goal: Returnt to PLOF.   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  [employer] Communication: No difficulties (pt has an [place] accent) Dominant Hand:  (ambidextrous)         Vision/Perception     Cognition  Overall Cognitive Status: Appears within functional limits for tasks assessed/performed Arousal/Alertness: Awake/alert Orientation Level: Oriented X4 / Intact Behavior During Session: WFL for tasks performed    Extremity/Trunk Assessment Right Upper Extremity Assessment RUE ROM/Strength/Tone: Within functional levels RUE Sensation: WFL - Light Touch RUE Coordination: WFL - gross/fine motor Left Upper Extremity Assessment LUE ROM/Strength/Tone: Within functional levels LUE ROM/Strength/Tone Deficits: L shld flex 4+/5 LUE Sensation: WFL - Light Touch (+ drift L UE) LUE Coordination: Deficits LUE Coordination Deficits: mild difficulty with coordination tasks, rapid alternating movments Right Lower Extremity Assessment RLE ROM/Strength/Tone: Within functional levels RLE Coordination: WFL - gross/fine motor Left Lower Extremity Assessment LLE ROM/Strength/Tone: Within functional levels LLE Coordination: WFL - gross/fine motor Trunk Assessment Trunk Assessment: Normal     Mobility Bed Mobility Bed Mobility: Supine to Sit;Sitting - Scoot to Edge of Bed Supine to Sit: 6: Modified independent (Device/Increase time);HOB flat Sitting - Scoot to Edge of Bed: 6: Modified independent (Device/Increase time);With rail Details for Bed Mobility Assistance: increased time Transfers Transfers: Sit to Stand;Stand to Sit Sit to Stand: 6: Modified independent (Device/Increase time);With upper extremity assist;From bed Stand to Sit: With upper extremity assist;To chair/3-in-1 Details for Transfer Assistance: initially unsteady upon initial stand due to being in bed for a day but then became more stable t/o session     Shoulder Instructions     Exercise     Balance Standardized Balance Assessment Standardized Balance Assessment: Dynamic  Gait Index Dynamic Gait Index Level Surface: Normal Change in  Gait Speed: Normal Gait with Horizontal Head Turns: Normal Gait with Vertical Head Turns: Normal Gait and Pivot Turn: Normal Step Over Obstacle: Mild Impairment Step Around Obstacles: Normal Steps: Mild Impairment Total Score: 22    End of Session OT - End of Session Activity Tolerance: Patient tolerated treatment well Patient left: in chair;with call bell/phone within reach;with family/visitor present Nurse Communication:  (pt wants to eat breakfast)  GO Functional Assessment Tool Used: clinical judgement Functional Limitation: Self care Self Care Current Status (Z6109): At least 1 percent but less than 20 percent impaired, limited or restricted Self Care Goal Status (U0454): At least 1 percent but less than 20 percent impaired, limited or restricted Self Care Discharge Status [phone]): At least 1 percent but less than 20 percent impaired, limited or restricted   Evern Bio 09/15/2012, 11:11 AM [phone]

## 2012-09-15 NOTE — Progress Notes (Signed)
Bilateral:  No evidence of hemodynamically significant internal carotid artery stenosis.   Vertebral artery flow is antegrade.     

## 2012-09-16 ENCOUNTER — Encounter (HOSPITAL_COMMUNITY)
Admission: EM | Disposition: A | Discharge status: Home / Self Care | Payer: Self-pay | Source: Home / Self Care | Attending: Internal Medicine

## 2012-09-16 ENCOUNTER — Encounter (HOSPITAL_COMMUNITY): Payer: Self-pay

## 2012-09-16 DIAGNOSIS — I6789 Other cerebrovascular disease: Secondary | ICD-10-CM

## 2012-09-16 HISTORY — PX: TEE WITHOUT CARDIOVERSION: SHX5443

## 2012-09-16 LAB — CBC
MCH: 28.5 pg (ref 26.0–34.0)
MCHC: 34 g/dL (ref 30.0–36.0)
MCV: 83.9 fL (ref 78.0–100.0)
Platelets: 144 10*3/uL — ABNORMAL LOW (ref 150–400)
RBC: 5.33 MIL/uL (ref 4.22–5.81)
RDW: 13.6 % (ref 11.5–15.5)

## 2012-09-16 LAB — BASIC METABOLIC PANEL
Calcium: 9 mg/dL (ref 8.4–10.5)
Creatinine, Ser: 1.18 mg/dL (ref 0.50–1.35)
GFR calc Af Amer: 70 mL/min — ABNORMAL LOW (ref >90)
GFR calc non Af Amer: 61 mL/min — ABNORMAL LOW (ref >90)
Sodium: 142 mEq/L (ref 135–145)

## 2012-09-16 LAB — GLUCOSE, CAPILLARY: Glucose-Capillary: 109 mg/dL — ABNORMAL HIGH (ref 70–99)

## 2012-09-16 SURGERY — ECHOCARDIOGRAM, TRANSESOPHAGEAL
Anesthesia: Moderate Sedation

## 2012-09-16 MED ORDER — BUTAMBEN-TETRACAINE-BENZOCAINE 2-2-14 % EX AERO
INHALATION_SPRAY | CUTANEOUS | Status: DC | PRN
  Administered 2012-09-16: 2 via TOPICAL

## 2012-09-16 MED ORDER — CLOPIDOGREL BISULFATE 75 MG PO TABS: 75.0000 mg | ORAL_TABLET | Freq: Every day | ORAL | Status: DC

## 2012-09-16 MED ORDER — FENTANYL CITRATE 0.05 MG/ML IJ SOLN
INTRAMUSCULAR | Status: DC | PRN
  Administered 2012-09-16: 50 ug via INTRAVENOUS

## 2012-09-16 MED ORDER — MIDAZOLAM HCL 10 MG/2ML IJ SOLN
INTRAMUSCULAR | Status: DC | PRN
  Administered 2012-09-16 (×2): 2 mg via INTRAVENOUS

## 2012-09-16 MED ORDER — FENTANYL CITRATE 0.05 MG/ML IJ SOLN
INTRAMUSCULAR | Status: AC
  Filled 2012-09-16: qty 2

## 2012-09-16 MED ORDER — MIDAZOLAM HCL 5 MG/ML IJ SOLN
INTRAMUSCULAR | Status: AC
  Filled 2012-09-16: qty 2

## 2012-09-16 NOTE — CV Procedure (Signed)
Procedure: TEE   Indication: CVA  Sedation: Versed 4 mg IV, 50 mcg IV  Findings: Please see echo section for full report.  Normal LV size and systolic function, EF 60%.  Normal RV size and systolic function.  No PFO/ASD.  No LA appendage thrombus.  Mild plaque only in aorta.  No source of embolus.   Samuel Stephens 09/16/2012 11:09 AM

## 2012-09-16 NOTE — Progress Notes (Signed)
  Echocardiogram Echocardiogram Transesophageal has been performed.  Samuel Stephens 09/16/2012, 3:14 PM

## 2012-09-16 NOTE — Progress Notes (Signed)
Stroke Team Progress Note  HISTORY PARSA Samuel Stephens is an 72 y.o. male history of hyperlipidemia and glucose intolerance who experienced acute onset of weakness involving left face arm and leg this afternoon. Patient was last seen normal at 3 PM 09/14/2012. When he woke up from a nap at around 5:00 the above deficits were present. There is no previous history of stroke nor TIA. Patient has been taking aspirin 81 mg per day. CT scan of his head showed no acute intracranial abnormality. Agents deficits resolved after arriving in the emergency room. NIH stroke score was 0. Patient was not a TPA candidate secondary to rapidly improving symptoms. He was admitted for further evaluation and treatment.  SUBJECTIVE Currently in endo for TEE. Went to see him in the procedure room.  OBJECTIVE Most recent Vital Signs: Filed Vitals:   09/16/12 0631 09/16/12 0946 09/16/12 1023 09/16/12 1040  BP: 120/68 129/73 125/81 132/93  Pulse: 61 76    Temp: 97.9 F (36.6 C) 98.2 F (36.8 C) 97.9 F (36.6 C)   TempSrc: Oral Oral Oral   Resp: 20 20 13 16   Height:      Weight:      SpO2: 97% 96% 97% 97%   CBG (last 3)   Basename 09/16/12 0635 09/15/12 2147 09/15/12 1639  GLUCAP 109* 142* 105*   IV Fluid Intake:     . sodium chloride 500 mL (09/16/12 1035)   MEDICATIONS    . clopidogrel  75 mg Oral Q breakfast  . simvastatin  80 mg Oral QHS   PRN:  acetaminophen, butamben-tetracaine-benzocaine, fentaNYL, midazolam  Diet:  NPO for TEE Activity:   Bathroom privileges with assistance DVT Prophylaxis:  SCDs   CLINICALLY SIGNIFICANT STUDIES Basic Metabolic Panel:   Lab 09/16/12 0545 09/14/12 1747 09/14/12 1745  NA 142 141 --  K 4.4 4.0 --  CL 105 107 --  CO2 26 -- 22  GLUCOSE 108* 136* --  BUN 16 19 --  CREATININE 1.18 1.20 --  CALCIUM 9.0 -- 9.3  MG -- -- --  PHOS -- -- --   Liver Function Tests:   Lab 09/14/12 1745  AST 20  ALT 17  ALKPHOS 73  BILITOT 0.5  PROT 6.4  ALBUMIN 3.6    CBC:   Lab 09/16/12 0545 09/14/12 1747 09/14/12 1745  WBC 13.6* -- 16.0*  NEUTROABS -- -- 4.6  HGB 15.2 15.6 --  HCT 44.7 46.0 --  MCV 83.9 -- 82.9  PLT 144* -- 173   Coagulation:   Lab 09/14/12 1745  LABPROT 12.5  INR 0.94   Cardiac Enzymes:   Lab 09/14/12 1745  CKTOTAL --  CKMB --  CKMBINDEX --  TROPONINI <0.30   Urinalysis:   Lab 09/14/12 1846  COLORURINE YELLOW  LABSPEC 1.017  PHURINE 6.0  GLUCOSEU NEGATIVE  HGBUR NEGATIVE  BILIRUBINUR NEGATIVE  KETONESUR NEGATIVE  PROTEINUR NEGATIVE  UROBILINOGEN 0.2  NITRITE NEGATIVE  LEUKOCYTESUR NEGATIVE   Lipid Panel    Component Value Date/Time   CHOL 172 09/15/2012 0728   TRIG 121 09/15/2012 0728   HDL 44 09/15/2012 0728   CHOLHDL 3.9 09/15/2012 0728   VLDL 24 09/15/2012 0728   LDLCALC 104* 09/15/2012 0728   HgbA1C  Lab Results  Component Value Date   HGBA1C 6.0* 09/14/2012    Urine Drug Screen:   No results found for this basename: labopia,  cocainscrnur,  labbenz,  amphetmu,  thcu,  labbarb    Alcohol Level: No results found for  this basename: ETH:2 in the last 168 hours  CT of the brain  09/14/2012   Mild atrophy.  No acute finding by noncontrast CT.    MRI of the brain  09/15/2012  Sub-centimeter acute infarction affecting the mesial temporal lobe on the right.  Approximately 1 cm acute infarction affecting the posterior limb internal capsule/corona radiata on the right.    MRA of the brain  09/15/2012   No major vessel occlusion or correctable proximal stenosis.  Distal vessel atherosclerotic irregularity in the MCA territories bilaterally.    2D Echocardiogram  EF 55-60% with no source of embolus.   Carotid Doppler  No evidence of hemodynamically significant internal carotid artery stenosis. Vertebral artery flow is antegrade.   CXR 09/14/2012   No acute abnormality.     EKG  normal sinus rhythm, RBBB.   Therapy Recommendations OP OT, OP PT  Physical Exam  Pleasant elderly Caucasian male not in  distress.Awake alert oriented x 3 normal speech and language. Mild left lower face asymmetry. Tongue midline. No drift. Mild diminished fine finger movements on left. Orbits right over left upper extremity. Mild left grip weak.. Normal sensation . Normal coordination.   ASSESSMENT Samuel Stephens is a 72 y.o. male presenting with transient left hemiparesis. 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 Infarcts felt to be embolic secondary to unknown source.  Work up underway. On aspirin 81 mg orally every day prior to admission. Now on aspirin 325 mg orally every day for secondary stroke prevention. Patient with resultant mild left hand clusiness.  Hyperlipidemia, LDL 104, on statin (zocor 40) PTA and now, goal LDL < 100 CLL CKD  Hospital day # 2  TREATMENT/PLAN  Change aspirin to  clopidogrel 75 mg orally every day for secondary stroke prevention. F/u TEE results If positive for PFO (patent foramen ovale), check bilateral lower extremity venous dopplers to rule out DVT as possible source of stroke. Please schedule outpatient telemetry monitoring to assess patient for atrial fibrillation as source of stroke. May be arranged with patient's cardiologist, or cardiologist of choice.  Ok for discharge after TEE. OP PT and OT Follow up Dr. Pearlean Brownie in 2 months  Annie Main, MSN, RN, ANVP-BC, ANP-BC, [facility] Pager: [phone] 09/16/2012 10:55 AM  I have personally obtained a history, examined the patient, evaluated imaging results, and formulated the assessment and plan of care. I agree with the above. Delia Heady, MD Medical Director [facility] Pager: [phone] 09/16/2012 10:55 AM

## 2012-09-16 NOTE — Discharge Instructions (Signed)
STROKE/TIA DISCHARGE INSTRUCTIONS SMOKING Cigarette smoking nearly doubles your risk of having a stroke & is the single most alterable risk factor  If you smoke or have smoked in the last 12 months, you are advised to quit smoking for your health.  Most of the excess cardiovascular risk related to smoking disappears within a year of stopping.  Ask you doctor about anti-smoking medications  [employer] Quit Line: 1-800-QUIT NOW  Free Smoking Cessation Classes [phone]  CHOLESTEROL Know your levels; limit fat & cholesterol in your diet  Lipid Panel     Component Value Date/Time   CHOL 189 11/10/2011 1413   TRIG 143.0 11/10/2011 1413   HDL 51.50 11/10/2011 1413   CHOLHDL 4 11/10/2011 1413   VLDL 28.6 11/10/2011 1413   LDLCALC 109* 11/10/2011 1413      Many patients benefit from treatment even if their cholesterol is at goal.  Goal: Total Cholesterol (CHOL) less than 160  Goal:  Triglycerides (TRIG) less than 150  Goal:  HDL greater than 40  Goal:  LDL (LDLCALC) less than 100   BLOOD PRESSURE American Stroke Association blood pressure target is less that 120/80 mm/Hg  Your discharge blood pressure is:  BP: 126/75 mmHg  Monitor your blood pressure  Limit your salt and alcohol intake  Many individuals will require more than one medication for high blood pressure  DIABETES (A1c is a blood sugar average for last 3 months) Goal HGBA1c is under 7% (HBGA1c is blood sugar average for last 3 months)  Diabetes: {STROKE DC DIABETES:22357}    No results found for this basename: HGBA1C     Your HGBA1c can be lowered with medications, healthy diet, and exercise.  Check your blood sugar as directed by your physician  Call your physician if you experience unexplained or low blood sugars.  PHYSICAL ACTIVITY/REHABILITATION Goal is 30 minutes at least 4 days per week    {STROKE DC ACTIVITY/REHAB:22359}  Activity decreases your risk of heart attack and stroke and makes your heart stronger.  It  helps control your weight and blood pressure; helps you relax and can improve your mood.  Participate in a regular exercise program.  Talk with your doctor about the best form of exercise for you (dancing, walking, swimming, cycling).  DIET/WEIGHT Goal is to maintain a healthy weight  Your discharge diet is: NPO *** liquids Your height is:  Height: 5\' 10"  (177.8 cm) Your current weight is: Weight: 96.3 kg (212 lb 4.9 oz) Your Body Mass Index (BMI) is:  BMI (Calculated): 30.5   Following the type of diet specifically designed for you will help prevent another stroke.  Your goal weight range is:  ***  Your goal Body Mass Index (BMI) is 19-24.  Healthy food habits can help reduce 3 risk factors for stroke:  High cholesterol, hypertension, and excess weight.  RESOURCES Stroke/Support Group:  Call [phone]  they meet the 3rd Sunday of the month on the Rehab Unit at [facility], [place] ( no meetings June, July & Aug).  STROKE EDUCATION PROVIDED/REVIEWED AND GIVEN TO PATIENT Stroke warning signs and symptoms How to activate emergency medical system (call 911). Medications prescribed at discharge. Need for follow-up after discharge. Personal risk factors for stroke. Pneumonia vaccine given:   {STROKE DC YES/NO/DATE:22363} Flu vaccine given:   {STROKE DC YES/NO/DATE:22363} My questions have been answered, the writing is legible, and I understand these instructions.  I will adhere to these goals & educational materials that have been provided to me  after my discharge from the hospital.

## 2012-09-16 NOTE — Interval H&P Note (Signed)
History and Physical Interval Note:  09/16/2012 10:49 AM  Samuel Stephens  has presented today for surgery, with the diagnosis of cva  The various methods of treatment have been discussed with the patient and family. After consideration of risks, benefits and other options for treatment, the patient has consented to  Procedure(s) (LRB) with comments: TRANSESOPHAGEAL ECHOCARDIOGRAM (TEE) (N/A) as a surgical intervention .  The patient's history has been reviewed, patient examined, no change in status, stable for surgery.  I have reviewed the patient's chart and labs.  Questions were answered to the patient's satisfaction.     [name] [employer]

## 2012-09-16 NOTE — H&P (View-Only) (Signed)
[facility] PROGRESS NOTE  Stephens Stephens MRN:[id number] DOB: 05/14/1941 DOA: 09/14/2012 PCP: KWIATKOWSKI,PETER FRANK, MD  Assessment/Plan: Principal Problem:  *TIA (transient ischemic attack) Active Problems:  LEUKEMIA, LYMPHOCYTIC, CHRONIC  CKD (chronic kidney disease)    1. Acute CVA: Patient presented with transient left lower extremity weakness. Head Ct scan showed mild atrophy, but no acute finding. Brain MRI on the other hand, revealed Sub-centimeter acute infarction affecting the mesial temporal lobe on the right, as well as an approximately 1 cm acute infarction affecting the posterior limb internal capsule/corona radiata on the right. MRA showed no major vessel occlusion or correctable proximal stenosis. Distal vessel atherosclerotic irregularity in the MCA territories bilaterally. Dr Charles Stewart provided initial neurology consultation, and patient was subsequently seen by Dr Pramod Sethi. The rest of CVA work up, including Carotid Doppler and 2D Echocardiogram are in progress, but Dr Sethi has recommended TTE. Cardiology has been consulted accordingly. Lipid panel, TSH are pending, and patient is on Plavix for secondary stroke prevention, as he was on ASA,. Pre-admission.  2. CLL: Patient has a known history of chronic lymphocytic leukemia, diagnosed in 2002, and appears stable from this view point. Primary oncologist is Dr Peter Rubin. Wcc is elevated at 16.0, but at baseline. 3. CKD (chronic kidney disease): Creatinine is stable and at Baseline. Lasix is on hold.  4. Dyslipidemia: Lipid profile is as follows: TC 172, TC 121, HDL 44, LDL 104. In view of CVD, target LDL should be <70. Will increase Zocor to 80 mg daily.    Code Status: Full Code.  Family Communication:  Disposition Plan: To be determined.    Brief narrative: 71 y.o. male with history of Hyperlipidemia, impaired glucose tolerance); CKD, Colonic polyp; and CLL (chronic lymphoblastic leukemia) (2002).  At 3 PM on 09/14/12, he went for a nap, thought he had a cramp in the left leg, tried to walk it off but he noted that his left leg could not hold any weight. He crawled out of bed around 4 PM and called his wife. Once EMS arrived, his left leg was very heavy. EMS also noted slurred speech and Left facial droop but by the time he arrived to ER symptoms had improved. Code stroke was called but was canceled due to minimal symptoms. At the time of evaluation, by hospitalist, he was back to his baseline. CT of the head was unremarkable. Admit for TIA work up and further management.    Consultants:  Dr Charles Stewart, neurologist.   Procedures:  CXR.  Head Ct scan.   Antibiotics:  N/A.   HPI/Subjective: Asymptomatic.   Objective: Vital signs in last 24 hours: Temp:  [97.7 F (36.5 C)-98.1 F (36.7 C)] 97.7 F (36.5 C) (01/15 0603) Pulse Rate:  [73-90] 75  (01/15 0603) Resp:  [18-20] 20  (01/15 0603) BP: (121-160)/(75-88) 126/75 mmHg (01/15 0603) SpO2:  [96 %-99 %] 97 % (01/15 0603) Weight:  [96.3 kg (212 lb 4.9 oz)] 96.3 kg (212 lb 4.9 oz) (01/14 2124) Weight change:  Last BM Date: 09/14/12  Intake/Output from previous day: 01/14 0701 - 01/15 0700 In: -  Out: 650 [Urine:650]     Physical Exam: General: Comfortable, alert, communicative, fully oriented, not short of breath at rest.  HEENT:  No clinical pallor, no jaundice, no conjunctival injection or discharge. NECK:  Supple, JVP not seen, no carotid bruits, no palpable lymphadenopathy, no palpable goiter. CHEST:  Clinically clear to auscultation, no wheezes, no crackles. HEART:  Sounds 1 and 2   heard, normal, regular, no murmurs. ABDOMEN:  Full, soft, no scars, non-tender, no palpable organomegaly, no palpable masses, normal bowel sounds. GENITALIA:  Normal external genitalia. LOWER EXTREMITIES:  No pitting edema, palpable peripheral pulses. MUSCULOSKELETAL SYSTEM:  Generalized osteoarthritic changes, otherwise,  normal. CENTRAL NERVOUS SYSTEM:  No focal neurologic deficit on gross examination.  Lab Results:  Basename 09/14/12 1747 09/14/12 1745  WBC -- 16.0*  HGB 15.6 15.4  HCT 46.0 44.1  PLT -- 173    Basename 09/14/12 1747 09/14/12 1745  NA 141 138  K 4.0 4.2  CL 107 104  CO2 -- 22  GLUCOSE 136* 142*  BUN 19 20  CREATININE 1.20 1.19  CALCIUM -- 9.3   No results found for this or any previous visit (from the past 240 hour(s)).   Studies/Results: Ct Head Wo Contrast  09/14/2012  *RADIOLOGY REPORT*  Clinical Data: Stroke symptoms, slurred speech, left weakness  CT HEAD WITHOUT CONTRAST  Technique:  Contiguous axial images were obtained from the base of the skull through the vertex without contrast.  Comparison: None.  Findings: Age-related brain atrophy noted.  No acute intracranial hemorrhage, definite acute infarction, focal edema, mass effect, sulcal effacement, midline shift, herniation, hydrocephalus, or extra-axial fluid collection.  Cisterns are patent.  No definite cerebellar abnormality.  No osseous abnormality.  IMPRESSION: Mild atrophy.  No acute finding by noncontrast CT.  Findings called to Dr. Stewart 09/14/2012, 5:45 p.m.   Original Report Authenticated By: M. Shick, M.D.    Dg Chest Portable 1 View  09/14/2012  *RADIOLOGY REPORT*  Clinical Data: Code stroke  PORTABLE CHEST - 1 VIEW  Comparison: None  Findings: Prominent heart size without heart failure.  Lungs are clear without infiltrate effusion or mass lesion.  IMPRESSION: No acute abnormality.   Original Report Authenticated By: David Clark, M.D.     Medications: Scheduled Meds:   . aspirin  325 mg Oral Daily  . simvastatin  40 mg Oral QHS   Continuous Infusions:  PRN Meds:.acetaminophen    LOS: 1 day   Stephens Stephens,Stephens Stephens  [facility] Pager [phone]. If 8PM-8AM, please contact night-coverage at [email or web address], password [email or web address] 09/15/2012, 8:23 AM  LOS: 1 day     

## 2012-09-17 ENCOUNTER — Encounter (HOSPITAL_COMMUNITY): Payer: Self-pay | Admitting: Cardiology

## 2012-09-22 ENCOUNTER — Ambulatory Visit: Payer: Medicare Other | Admitting: Internal Medicine

## 2012-09-22 ENCOUNTER — Encounter: Payer: Self-pay | Admitting: Family Medicine

## 2012-09-22 ENCOUNTER — Ambulatory Visit (INDEPENDENT_AMBULATORY_CARE_PROVIDER_SITE_OTHER): Payer: Medicare Other | Admitting: Family Medicine

## 2012-09-22 VITALS — BP 118/72 | Temp 97.9°F | Wt 206.0 lb

## 2012-09-22 DIAGNOSIS — I639 Cerebral infarction, unspecified: Secondary | ICD-10-CM

## 2012-09-22 DIAGNOSIS — I6350 Cerebral infarction due to unspecified occlusion or stenosis of unspecified cerebral artery: Secondary | ICD-10-CM

## 2012-09-22 DIAGNOSIS — E785 Hyperlipidemia, unspecified: Secondary | ICD-10-CM

## 2012-09-22 DIAGNOSIS — C9110 Chronic lymphocytic leukemia of B-cell type not having achieved remission: Secondary | ICD-10-CM

## 2012-09-22 NOTE — Progress Notes (Signed)
Subjective:    Patient ID: Samuel Stephens, male    DOB: Dec 22, 1940, 72 y.o.   MRN: [id number]  HPI Hospital followup.  Patient admitted on 09/14/2012 and discharged on 09/16/2012. Patient has chronic problems including chronic lymphoblastic leukemia and chronic kidney disease. On the day of admission, he took a nap. When he woke up he thought he had a cramp in his left leg and tried walking but had weakness. His wife noted slurred speech and left facial droop. By the time he arrived in the emergency room symptoms had improved. Code stroke was called but canceled because of the symptom improvement. CT of head unremarkable. MRI of brain revealed subcentimeter acute infarction involving mesial temporal lobe on the right. Patient also had approximately 1 cm acute infarction involving posterior limb internal capsule/corona radiata on the right. MR angiogram revealed no major vessel occlusion. Carotid Dopplers revealed no significant internal carotid stenosis. Echocardiogram ejection fraction 55-60% with no regional wall motion abnormalities.  Transesophageal echo revealed ejection fraction 60% with no evidence for thrombus. No source of embolus. Patient was on aspirin prior to admission and discharged on Plavix. Symptoms fully resolved at this time.  Patient had hospital labs including total cholesterol 172, HDL 44, and LDL 104. There had been discussion of raising his Zocor to 80 mg but this was not done at discharge. Patient prefers to make lifestyle changes and followup lipids rather than changing lipids at this time. No history of smoking. No history of hypertension.  Past Medical History  Diagnosis Date  . Hyperlipidemia   . Pedal edema   . Glucose intolerance (impaired glucose tolerance)   . CKD (chronic kidney disease)   . Colonic polyp   . CLL (chronic lymphoblastic leukemia) 2002   Past Surgical History  Procedure Date  . Tee without cardioversion 09/16/2012    Procedure:  TRANSESOPHAGEAL ECHOCARDIOGRAM (TEE);  Surgeon: Laurey Morale, MD;  Location: [facility] ENDOSCOPY;  Service: Cardiovascular;  Laterality: N/A;    reports that he has never smoked. He has never used smokeless tobacco. He reports that he drinks alcohol. He reports that he does not use illicit drugs. family history includes Cancer in his mother; Diabetes in his father; and Stroke in his father and mother. No Known Allergies    Review of Systems  Constitutional: Negative for fatigue.  HENT: Negative for trouble swallowing.   Eyes: Negative for visual disturbance.  Respiratory: Negative for cough, chest tightness and shortness of breath.   Cardiovascular: Negative for chest pain, palpitations and leg swelling.  Neurological: Negative for dizziness, seizures, syncope, weakness, light-headedness and headaches.  Psychiatric/Behavioral: Negative for confusion.       Objective:   Physical Exam  Constitutional: He is oriented to person, place, and time. He appears well-developed and well-nourished.  HENT:  Mouth/Throat: Oropharynx is clear and moist.  Eyes: Pupils are equal, round, and reactive to light.  Neck: Neck supple. No thyromegaly present.  Cardiovascular: Normal rate and regular rhythm.  Exam reveals no gallop.   Pulmonary/Chest: Effort normal and breath sounds normal. No respiratory distress. He has no wheezes. He has no rales.  Abdominal: Soft. There is no tenderness.  Musculoskeletal: He exhibits no edema.  Lymphadenopathy:    He has no cervical adenopathy.  Neurological: He is alert and oriented to person, place, and time. No cranial nerve deficit.       Cerebellar function normal. Gait normal. No focal weakness. Speech normal  Skin: No rash noted.  Psychiatric: He has a normal  mood and affect. His behavior is normal. Judgment and thought content normal.          Assessment & Plan:  #1 CVA. Discussed secondary prevention. Continue Plavix. Discussed more aggressive treatment of  lipids and at this point he wishes to maintain his current dose of Zocor and make lifestyle changes and consider followup lipids with Dr. Kirtland Bouchard at physical in March. He does not have any hypertension.  Nonsmoker. No extra cranial embolic sources. #2 history of mild chronic kidney disease #3 history of chronic lymphoblastic leukemia

## 2012-09-22 NOTE — Patient Instructions (Addendum)

## 2012-10-23 ENCOUNTER — Encounter: Payer: Self-pay | Admitting: Oncology

## 2012-10-23 ENCOUNTER — Telehealth: Payer: Self-pay | Admitting: Oncology

## 2012-10-23 NOTE — Telephone Encounter (Signed)
s.w. pt and advised on new Dr. Clelia Croft...pt ok..pt requested to have lab dont 1 week prior to visit.Marland KitchenMarland KitchenDone...former pt for Dr Donnie Coffin....sent pt letter and appt schedule for NOV

## 2012-11-12 ENCOUNTER — Other Ambulatory Visit: Payer: Self-pay | Admitting: Internal Medicine

## 2012-12-06 ENCOUNTER — Other Ambulatory Visit: Payer: Self-pay | Admitting: Internal Medicine

## 2013-01-12 ENCOUNTER — Encounter: Payer: Self-pay | Admitting: Neurology

## 2013-01-12 ENCOUNTER — Ambulatory Visit (INDEPENDENT_AMBULATORY_CARE_PROVIDER_SITE_OTHER): Payer: Self-pay | Admitting: Neurology

## 2013-01-12 VITALS — BP 129/83 | HR 80 | Temp 98.0°F | Ht 69.0 in | Wt 202.0 lb

## 2013-01-12 DIAGNOSIS — I4891 Unspecified atrial fibrillation: Secondary | ICD-10-CM

## 2013-01-12 DIAGNOSIS — I6350 Cerebral infarction due to unspecified occlusion or stenosis of unspecified cerebral artery: Secondary | ICD-10-CM

## 2013-01-12 DIAGNOSIS — I639 Cerebral infarction, unspecified: Secondary | ICD-10-CM

## 2013-01-12 NOTE — Patient Instructions (Signed)
LifeWatch cardiac monitoring for 3 weeks.  Return for follow up in office 3 months.    STROKE/TIA INSTRUCTIONS SMOKING Cigarette smoking nearly doubles your risk of having a stroke & is the single most alterable risk factor  If you smoke or have smoked in the last 12 months, you are advised to quit smoking for your health.  Most of the excess cardiovascular risk related to smoking disappears within a year of stopping.  Ask you doctor about anti-smoking medications  [place] Quit Line: 1-800-QUIT NOW  Free Smoking Cessation Classes [phone]  CHOLESTEROL Know your levels; limit fat & cholesterol in your diet  Lab Results  Component Value Date   CHOL 172 09/15/2012   HDL 44 09/15/2012   LDLCALC 104* 09/15/2012   TRIG 121 09/15/2012   CHOLHDL 3.9 09/15/2012      Many patients benefit from treatment even if their cholesterol is at goal.  Goal: Total Cholesterol less than 160  Goal:  LDL less than 100  Goal:  HDL greater than 40  Goal:  Triglycerides less than 150  BLOOD PRESSURE American Stroke Association blood pressure target is less that 120/80 mm/Hg  Your discharge blood pressure is:  BP: 129/83 mmHg  Monitor your blood pressure  Limit your salt and alcohol intake  Many individuals will require more than one medication for high blood pressure  DIABETES (A1c is a blood sugar average for last 3 months) Goal A1c is under 7% (A1c is blood sugar average for last 3 months)  Diabetes: Diagnosis of diabetes:  Your A1c:6 %    Lab Results  Component Value Date   HGBA1C 6.0* 09/14/2012    Your A1c can be lowered with medications, healthy diet, and exercise.  Check your blood sugar as directed by your physician  Call your physician if you experience unexplained or low blood sugars.  PHYSICAL ACTIVITY/REHABILITATION Goal is 30 minutes at least 4 days per week    Activity decreases your risk of heart attack and stroke and makes your heart stronger.  It helps control your weight  and blood pressure; helps you relax and can improve your mood.  Participate in a regular exercise program.  Talk with your doctor about the best form of exercise for you (dancing, walking, swimming, cycling).  DIET/WEIGHT Goal is to maintain a healthy weight  Your height is:  Height: 5\' 9"  (175.3 cm) Your current weight is: Weight: 202 lb (91.627 kg) Your body Mass Index (BMI) is:  BMI (Calculated): 29.9  Following the type of diet specifically designed for you will help prevent another stroke.  Your goal Body Mass Index (BMI) is 19-24.  Healthy food habits can help reduce 3 risk factors for stroke:  High cholesterol, hypertension, and excess weight.

## 2013-01-12 NOTE — Progress Notes (Signed)
[facility]  PATIENT: Samuel Stephens DOB: [birth year]   HISTORY FROM: patient REASON FOR VISIT: hospital follow up for stroke  HISTORY OF PRESENT ILLNESS:  Hospital followup. Patient admitted on 09/14/2012 and discharged on 09/16/2012. Patient has chronic problems including chronic lymphoblastic leukemia and chronic kidney disease. On the day of admission, he took a nap. When he woke up he thought he had a cramp in his left leg and tried walking but had weakness. His wife noted slurred speech and left facial droop. By the time he arrived in the emergency room symptoms had improved. Code stroke was called but canceled because of the symptom improvement. CT of head unremarkable. MRI of brain revealed subcentimeter acute infarction involving mesial temporal lobe on the right. Patient also had approximately 1 cm acute infarction involving posterior limb internal capsule/corona radiata on the right. Samuel angiogram revealed no major vessel occlusion. Carotid Dopplers revealed no significant internal carotid stenosis. Echocardiogram ejection fraction 55-60% with no regional wall motion abnormalities.  Transesophageal echo revealed ejection fraction 60% with no evidence for thrombus. No source of embolus. Patient was on aspirin prior to admission and discharged on Plavix. Symptoms fully resolved at this time.  Patient had hospital labs including total cholesterol 172, HDL 44, and LDL 104. There had been discussion of raising his Zocor to 80 mg but this was not done at discharge. Patient prefers to make lifestyle changes and followup lipids rather than changing lipids at this time. No history of smoking. No history of hypertension.  01/12/13 Today in office, patient states he is doing very well and has no complaints.  Feels like the stroke was a wake-up call and he is watching his diet more closely and exercising more.  Patient denies medication side effects, no signs of bleeding.  Blood  pressure is under good control.  REVIEW OF SYSTEMS: Full 14 system review of systems performed and negative.  ALLERGIES: No Known Allergies  HOME MEDICATIONS: Outpatient Prescriptions Prior to Visit  Medication Sig Dispense Refill  . clopidogrel (PLAVIX) 75 MG tablet TAKE 1 TABLET BY MOUTH EVERY DAY WITH BREAKFAST  30 tablet  2  . furosemide (LASIX) 40 MG tablet Take 40 mg by mouth daily as needed. For fluid retention      . Omega-3 Fatty Acids (FISH OIL) 1200 MG CAPS Take 1,200 mg by mouth every evening.      . simvastatin (ZOCOR) 40 MG tablet TAKE 1 TABLET (40 MG TOTAL) BY MOUTH AT BEDTIME.  90 tablet  0   No facility-administered medications prior to visit.    PAST MEDICAL HISTORY: Past Medical History  Diagnosis Date  . Hyperlipidemia   . Pedal edema   . Glucose intolerance (impaired glucose tolerance)   . CKD (chronic kidney disease)   . Colonic polyp   . CLL (chronic lymphoblastic leukemia) 2002    PAST SURGICAL HISTORY: Past Surgical History  Procedure Laterality Date  . Tee without cardioversion  09/16/2012    Procedure: TRANSESOPHAGEAL ECHOCARDIOGRAM (TEE);  Surgeon: Laurey Morale, MD;  Location: [facility] ENDOSCOPY;  Service: Cardiovascular;  Laterality: N/A;    FAMILY HISTORY: Family History  Problem Relation Age of Onset  . Stroke Mother   . Cancer Mother     uterine and thyroid  . Stroke Father   . Diabetes Father     SOCIAL HISTORY: History   Social History  . Marital Status: Married    Spouse Name: N/A    Number of Children: N/A  .  Years of Education: N/A   Occupational History  . Not on file.   Social History Main Topics  . Smoking status: Never Smoker   . Smokeless tobacco: Never Used  . Alcohol Use: Yes     Comment: occassional/ a beer with every meal  . Drug Use: No  . Sexually Active: Not on file   Other Topics Concern  . Not on file   Social History Narrative  . No narrative on file     PHYSICAL EXAM  Filed Vitals:   01/12/13  1437  BP: 129/83  Pulse: 80  Temp: 98 F (36.7 C)  TempSrc: Oral  Height: 5\' 9"  (1.753 m)  Weight: 202 lb (91.627 kg)   Body mass index is 29.82 kg/(m^2).  GENERAL EXAM: Patient is in no distress, well developed and well groomed. HEAD: nontraumatic EARS, NOSE, and THROAT: Normal.  NECK: Supple, no JVD RESPIRATORY: Lungs CTA. CARDIOVASCULAR: Regular rate and rhythm, no murmurs, no carotid bruits SKIN: No rash, no bruising  NEUROLOGIC: MENTAL STATUS: awake, alert and oriented to person, place and time, language fluent, comprehension intact, naming intact CRANIAL NERVE:  pupils equal and reactive to light, visual fields full to confrontation, extraocular muscles intact, no nystagmus, facial sensation and strength symmetric, uvula midline, shoulder shrug symmetric, tongue midline. Mild asymmetry of mouth/slight droop on left at rest. Mild dysarthria. MOTOR: normal bulk and tone, full strength in the BUE, BLE SENSORY: normal and symmetric to light touch, pinprick, temperature, vibration and proprioception COORDINATION: finger-nose-finger, fine finger movements normal REFLEXES: deep tendon reflexes present and symmetric 2+,  GAIT/STATION: narrow based gait; able to walk on toes, heels and tandem; romberg is negative. No assistive device.   DIAGNOSTIC DATA (LABS, IMAGING, TESTING) - I reviewed patient records, labs, notes, testing and imaging myself where available.  Lab Results  Component Value Date   WBC 13.6* 09/16/2012   HGB 15.2 09/16/2012   HCT 44.7 09/16/2012   MCV 83.9 09/16/2012   PLT 144* 09/16/2012      Component Value Date/Time   NA 142 09/16/2012 0545   NA 140 07/19/2012 0850   K 4.4 09/16/2012 0545   K 4.2 07/19/2012 0850   CL 105 09/16/2012 0545   CL 111* 07/19/2012 0850   CO2 26 09/16/2012 0545   CO2 25 07/19/2012 0850   GLUCOSE 108* 09/16/2012 0545   GLUCOSE 114* 07/19/2012 0850   BUN 16 09/16/2012 0545   BUN 16.0 07/19/2012 0850   CREATININE 1.18 09/16/2012 0545    CREATININE 1.2 07/19/2012 0850   CREATININE 1.34 10/09/2008 0928   CALCIUM 9.0 09/16/2012 0545   CALCIUM 9.1 07/19/2012 0850   PROT 6.4 09/14/2012 1745   PROT 6.1* 07/19/2012 0850   ALBUMIN 3.6 09/14/2012 1745   ALBUMIN 3.6 07/19/2012 0850   AST 20 09/14/2012 1745   AST 22 07/19/2012 0850   ALT 17 09/14/2012 1745   ALT 23 07/19/2012 0850   ALKPHOS 73 09/14/2012 1745   ALKPHOS 62 07/19/2012 0850   BILITOT 0.5 09/14/2012 1745   BILITOT 0.99 07/19/2012 0850   GFRNONAA 61* 09/16/2012 0545   GFRAA 70* 09/16/2012 0545   Lab Results  Component Value Date   CHOL 172 09/15/2012   HDL 44 09/15/2012   LDLCALC 104* 09/15/2012   TRIG 121 09/15/2012   CHOLHDL 3.9 09/15/2012   Lab Results  Component Value Date   HGBA1C 6.0* 09/14/2012   No results found for this basename: [id number]   Lab Results  Component Value Date  TSH 1.56 06/21/2010     ASSESSMENT  Samuel Stephens is a 72 year old male here for follow up of a mesial temporal lobe and right PLIC infarcts on 09/14/12. Left hemiparesis resolved on arrival to hospital.  Infarcts felt to be embolic secondary to unknown source. Since TEE was negative for PFO we will schedule outpatient telemetry monitoring to assess patient for paroxysmal atrial fibrillation as source of stroke. CLL, CKD stable.   PLAN  Continue Plavix 75 mg orally daily for secondary stroke prevention.  [facility]Watch cardiac telemetry monitoring x 3 weeks.  Cholesterol goals are less than 200 for total cholesterol and less than 100 for LDL. (Last LDL 104) Continue Zocor 40 mg and heart healthy diet.  Return for follow up appt in 3 months.   Dyke Weible NP-C 01/12/2013, 3:34 PM  [facility] [address] [place], [place] [address] [phone]  I have personally examined this patient, reviewed pertinent data, developed plan of care and discussed with patient and agree with above.  Delia Heady, MD

## 2013-01-14 ENCOUNTER — Encounter: Payer: Self-pay | Admitting: Internal Medicine

## 2013-01-14 ENCOUNTER — Ambulatory Visit (INDEPENDENT_AMBULATORY_CARE_PROVIDER_SITE_OTHER): Payer: Medicare Other | Admitting: Internal Medicine

## 2013-01-14 VITALS — BP 146/88 | HR 76 | Temp 97.7°F | Resp 18 | Ht 69.5 in | Wt 202.0 lb

## 2013-01-14 DIAGNOSIS — Z0000 Encounter for general adult medical examination without abnormal findings: Secondary | ICD-10-CM

## 2013-01-14 DIAGNOSIS — E785 Hyperlipidemia, unspecified: Secondary | ICD-10-CM

## 2013-01-14 DIAGNOSIS — N400 Enlarged prostate without lower urinary tract symptoms: Secondary | ICD-10-CM

## 2013-01-14 DIAGNOSIS — Z86010 Personal history of colonic polyps: Secondary | ICD-10-CM

## 2013-01-14 DIAGNOSIS — G459 Transient cerebral ischemic attack, unspecified: Secondary | ICD-10-CM

## 2013-01-14 DIAGNOSIS — I639 Cerebral infarction, unspecified: Secondary | ICD-10-CM

## 2013-01-14 DIAGNOSIS — N189 Chronic kidney disease, unspecified: Secondary | ICD-10-CM

## 2013-01-14 DIAGNOSIS — C9110 Chronic lymphocytic leukemia of B-cell type not having achieved remission: Secondary | ICD-10-CM

## 2013-01-14 DIAGNOSIS — I6350 Cerebral infarction due to unspecified occlusion or stenosis of unspecified cerebral artery: Secondary | ICD-10-CM

## 2013-01-14 MED ORDER — CLOPIDOGREL BISULFATE 75 MG PO TABS: ORAL_TABLET | ORAL | Status: DC

## 2013-01-14 MED ORDER — FUROSEMIDE 40 MG PO TABS: 40.0000 mg | ORAL_TABLET | Freq: Every day | ORAL | Status: DC | PRN

## 2013-01-14 MED ORDER — SIMVASTATIN 40 MG PO TABS: ORAL_TABLET | ORAL | Status: DC

## 2013-01-14 NOTE — Patient Instructions (Signed)
Limit your sodium (Salt) intake  Please check your blood pressure on a regular basis.  If it is consistently greater than 150/90, please make an office appointment.    It is important that you exercise regularly, at least 20 minutes 3 to 4 times per week.  If you develop chest pain or shortness of breath seek  medical attention.  Return in one year for follow-up  

## 2013-01-14 NOTE — Progress Notes (Signed)
Patient ID: Samuel Stephens, male   DOB: 02-09-1941, 72 y.o.   MRN: [id number]  Subjective:    Patient ID: Samuel Stephens, male    DOB: 1940-11-24, 89 y.o.   MRN: [id number]  HPI  72 year-old patient who is seen today for a wellness exam. He is followed by hematology annually for stable CLL. He has a history of mild dyslipidemia BPH and a history of colonic polyps. He had followup colonoscopy in February of 2012. He feels quite well today no concerns or complaints. He still teaches and remains quite active. He denies any cardiopulmonary complaints.  He was hospitalized briefly in January of this year for a stroke resulted in left lower extremity weakness. He had a full neurological evaluation and did see neurology 2 days ago. He is planned for a 3 week event monitor to rule out PAF. Presently he is on Plavix.  Social history works part-time Psychiatrist at [employer]   Past Medical History  Diagnosis Date  . Hyperlipidemia   . Pedal edema   . Glucose intolerance (impaired glucose tolerance)   . CKD (chronic kidney disease)   . Colonic polyp   . CLL (chronic lymphoblastic leukemia) 2002    History   Social History  . Marital Status: Married    Spouse Name: N/A    Number of Children: N/A  . Years of Education: N/A   Occupational History  . Not on file.   Social History Main Topics  . Smoking status: Never Smoker   . Smokeless tobacco: Never Used  . Alcohol Use: Yes     Comment: occassional/ a beer with every meal  . Drug Use: No  . Sexually Active: Not on file   Other Topics Concern  . Not on file   Social History Narrative  . No narrative on file    Past Surgical History  Procedure Laterality Date  . Tee without cardioversion  09/16/2012    Procedure: TRANSESOPHAGEAL ECHOCARDIOGRAM (TEE);  Surgeon: Laurey Morale, MD;  Location: [facility] ENDOSCOPY;  Service: Cardiovascular;  Laterality: N/A;    Family History  Problem Relation Age of Onset  . Stroke Mother   .  Cancer Mother     uterine and thyroid  . Stroke Father   . Diabetes Father     No Known Allergies  Current Outpatient Prescriptions on File Prior to Visit  Medication Sig Dispense Refill  . clopidogrel (PLAVIX) 75 MG tablet TAKE 1 TABLET BY MOUTH EVERY DAY WITH BREAKFAST  30 tablet  2  . furosemide (LASIX) 40 MG tablet Take 40 mg by mouth daily as needed. For fluid retention      . Omega-3 Fatty Acids (FISH OIL) 1200 MG CAPS Take 1,200 mg by mouth every evening.      . simvastatin (ZOCOR) 40 MG tablet TAKE 1 TABLET (40 MG TOTAL) BY MOUTH AT BEDTIME.  90 tablet  0   No current facility-administered medications on file prior to visit.    BP 146/88  Pulse 76  Temp(Src) 97.7 F (36.5 C) (Oral)  Resp 18  Ht 5' 9.5" (1.765 m)  Wt 202 lb (91.627 kg)  BMI 29.41 kg/m2  SpO2 98%   1. Risk factors, based on past  M,S,F history-  cardiovascular as factors include dyslipidemia and a history of cerebrovascular disease  2.  Physical activities: Remains active no exercise limitations  3.  Depression/mood: No history depression or mood disorder  4.  Hearing: No deficits  5.  ADL's: Independent in all aspects of daily living  6.  Fall risk: Low  7.  Home safety: No problems identified  8.  Height weight, and visual acuity; height and weight stable no change in visual acuity  9.  Counseling: Nonapplicable  10. Lab orders based on risk factors: Recent lab performed in the hospital in January  11. Referral : Followup neurology and hematology  12. Care plan: Continue low-salt diet regular exercise regimen prune eating habits  13. Cognitive assessment: Alert and oriented with normal affect. No cognitive dysfunction       Review of Systems  Constitutional: Negative for fever, chills, activity change, appetite change and fatigue.  HENT: Negative for hearing loss, ear pain, congestion, rhinorrhea, sneezing, mouth sores, trouble swallowing, neck pain, neck stiffness, dental  problem, voice change, sinus pressure and tinnitus.   Eyes: Negative for photophobia, pain, redness and visual disturbance.  Respiratory: Negative for apnea, cough, choking, chest tightness, shortness of breath and wheezing.   Cardiovascular: Negative for chest pain, palpitations and leg swelling.  Gastrointestinal: Negative for nausea, vomiting, abdominal pain, diarrhea, constipation, blood in stool, abdominal distention, anal bleeding and rectal pain.  Genitourinary: Negative for dysuria, urgency, frequency, hematuria, flank pain, decreased urine volume, discharge, penile swelling, scrotal swelling, difficulty urinating, genital sores and testicular pain.  Musculoskeletal: Negative for myalgias, back pain, joint swelling, arthralgias and gait problem.  Skin: Negative for color change, rash and wound.  Neurological: Negative for dizziness, tremors, seizures, syncope, facial asymmetry, speech difficulty, weakness, light-headedness, numbness and headaches.  Hematological: Negative for adenopathy. Does not bruise/bleed easily.  Psychiatric/Behavioral: Negative for suicidal ideas, hallucinations, behavioral problems, confusion, sleep disturbance, self-injury, dysphoric mood, decreased concentration and agitation. The patient is not nervous/anxious.        Objective:   Physical Exam  Constitutional: He appears well-developed and well-nourished.  HENT:  Head: Normocephalic and atraumatic.  Right Ear: External ear normal.  Left Ear: External ear normal.  Nose: Nose normal.  Mouth/Throat: Oropharynx is clear and moist.  Eyes: Conjunctivae and EOM are normal. Pupils are equal, round, and reactive to light. No scleral icterus.  Neck: Normal range of motion. Neck supple. No JVD present. No thyromegaly present.  Cardiovascular: Regular rhythm, normal heart sounds and intact distal pulses.  Exam reveals no gallop and no friction rub.   No murmur heard. The left dorsalis pedis pulse possibly diminished   Pulmonary/Chest: Effort normal and breath sounds normal. He exhibits no tenderness.  Abdominal: Soft. Bowel sounds are normal. He exhibits no distension and no mass. There is no tenderness.  Genitourinary: Prostate normal and penis normal.  Musculoskeletal: Normal range of motion. He exhibits no edema and no tenderness.  Lymphadenopathy:    He has no cervical adenopathy.  Neurological: He is alert. He has normal reflexes. No cranial nerve deficit. Coordination normal.  Skin: Skin is warm and dry. No rash noted.  Psychiatric: He has a normal mood and affect. His behavior is normal.          Assessment & Plan:   Preventive health examination CLL stable BPH mild and largely asymptomatic History of colonic polyps followup colonoscopy in 2017

## 2013-04-01 ENCOUNTER — Encounter: Payer: Self-pay | Admitting: Neurology

## 2013-04-01 ENCOUNTER — Encounter: Payer: Self-pay | Admitting: Nurse Practitioner

## 2013-04-12 ENCOUNTER — Telehealth: Payer: Self-pay

## 2013-04-12 NOTE — Telephone Encounter (Signed)
I called and spoke with patient. A referral order was made but, it has not gotten to [facility]. I printed the referral and gave it to the person who coordinates this. Patient should get a call within the next few days from [facility]. I f you do not get a call by Monday, let me know.  We rescheduled patient's OV for July 26, 2013

## 2013-04-13 NOTE — Addendum Note (Signed)
Addended byHermenia Fiscal on: 04/13/2013 05:08 PM   Modules accepted: Orders

## 2013-04-14 ENCOUNTER — Ambulatory Visit: Payer: Self-pay | Admitting: Nurse Practitioner

## 2013-04-22 ENCOUNTER — Ambulatory Visit (INDEPENDENT_AMBULATORY_CARE_PROVIDER_SITE_OTHER): Payer: Medicare Other

## 2013-04-22 DIAGNOSIS — I4891 Unspecified atrial fibrillation: Secondary | ICD-10-CM

## 2013-04-22 DIAGNOSIS — I639 Cerebral infarction, unspecified: Secondary | ICD-10-CM

## 2013-04-22 DIAGNOSIS — I39 Endocarditis and heart valve disorders in diseases classified elsewhere: Secondary | ICD-10-CM

## 2013-04-22 NOTE — Progress Notes (Signed)
PLACED A E-CARDIO EVENT MONITOR

## 2013-05-31 ENCOUNTER — Telehealth: Payer: Self-pay | Admitting: *Deleted

## 2013-05-31 NOTE — Telephone Encounter (Signed)
Spoke with pt, aware monitor reviewed by dr Tenny Craw shows sinus rhythm. Pt without symptoms when activated monitor

## 2013-06-28 ENCOUNTER — Telehealth: Payer: Self-pay | Admitting: Internal Medicine

## 2013-06-28 MED ORDER — ZOSTER VACCINE LIVE 19400 UNT/0.65ML ~~LOC~~ SOLR: 0.6500 mL | Freq: Once | SUBCUTANEOUS | Status: DC

## 2013-06-28 NOTE — Telephone Encounter (Addendum)
Pt would like rx for shingle vaccine fax to [facility] [address]. Pt would like md to know he had heart monitoring test and good new no abnormal heart rhythm.

## 2013-06-28 NOTE — Telephone Encounter (Signed)
Spoke to pt told him Rx for Shingles vaccine sent to pharmacy. Pt verbalized understanding.

## 2013-07-11 ENCOUNTER — Telehealth: Payer: Self-pay | Admitting: Internal Medicine

## 2013-07-11 NOTE — Telephone Encounter (Signed)
Spoke to pt told him to check with Oncology if they say it is okay need to check with insurance on cost. Pt verbalized understanding.

## 2013-07-11 NOTE — Telephone Encounter (Signed)
Pt was dx with CLL leukemia in 2004. Pt would like to know can he get a shingle vaccine.

## 2013-07-14 ENCOUNTER — Other Ambulatory Visit: Payer: Self-pay | Admitting: Oncology

## 2013-07-14 DIAGNOSIS — C9110 Chronic lymphocytic leukemia of B-cell type not having achieved remission: Secondary | ICD-10-CM

## 2013-07-15 ENCOUNTER — Other Ambulatory Visit (HOSPITAL_BASED_OUTPATIENT_CLINIC_OR_DEPARTMENT_OTHER): Payer: Medicare Other

## 2013-07-15 DIAGNOSIS — C9110 Chronic lymphocytic leukemia of B-cell type not having achieved remission: Secondary | ICD-10-CM

## 2013-07-15 LAB — COMPREHENSIVE METABOLIC PANEL (CC13)
ALT: 22 U/L (ref 0–55)
AST: 21 U/L (ref 5–34)
Alkaline Phosphatase: 83 U/L (ref 40–150)
BUN: 18 mg/dL (ref 7.0–26.0)
Creatinine: 1.2 mg/dL (ref 0.7–1.3)
Total Bilirubin: 0.81 mg/dL (ref 0.20–1.20)

## 2013-07-15 LAB — CBC WITH DIFFERENTIAL/PLATELET
BASO%: 0.1 % (ref 0.0–2.0)
EOS%: 1.1 % (ref 0.0–7.0)
HCT: 45.5 % (ref 38.4–49.9)
LYMPH%: 70.3 % — ABNORMAL HIGH (ref 14.0–49.0)
MCH: 28.8 pg (ref 27.2–33.4)
MCHC: 33.6 g/dL (ref 32.0–36.0)
MCV: 85.7 fL (ref 79.3–98.0)
MONO%: 3.8 % (ref 0.0–14.0)
NEUT%: 24.7 % — ABNORMAL LOW (ref 39.0–75.0)
lymph#: 11 10*3/uL — ABNORMAL HIGH (ref 0.9–3.3)

## 2013-07-20 ENCOUNTER — Encounter: Payer: Self-pay | Admitting: Nurse Practitioner

## 2013-07-20 ENCOUNTER — Ambulatory Visit (INDEPENDENT_AMBULATORY_CARE_PROVIDER_SITE_OTHER): Payer: Medicare Other | Admitting: Nurse Practitioner

## 2013-07-20 VITALS — BP 142/85 | HR 93 | Ht 69.0 in | Wt 207.0 lb

## 2013-07-20 DIAGNOSIS — I639 Cerebral infarction, unspecified: Secondary | ICD-10-CM

## 2013-07-20 DIAGNOSIS — I6350 Cerebral infarction due to unspecified occlusion or stenosis of unspecified cerebral artery: Secondary | ICD-10-CM

## 2013-07-20 NOTE — Patient Instructions (Signed)
PLAN  Continue Plavix 75 mg orally daily for secondary stroke prevention.  Cholesterol goals are less than 200 for total cholesterol and less than 100 for LDL. Continue Zocor 40 mg and heart healthy diet.  Return for follow up appt in 6 months.

## 2013-07-20 NOTE — Progress Notes (Signed)
[facility]  PATIENT: Samuel Stephens DOB: 01/07/1941   REASON FOR VISIT: follow up HISTORY FROM: patient  HISTORY OF PRESENT ILLNESS: Hospital followup. Patient admitted on 09/14/2012 and discharged on 09/16/2012. Patient has chronic problems including chronic lymphoblastic leukemia and chronic kidney disease. On the day of admission, he took a nap. When he woke up he thought he had a cramp in his left leg and tried walking but had weakness. His wife noted slurred speech and left facial droop. By the time he arrived in the emergency room symptoms had improved. Code stroke was called but canceled because of the symptom improvement. CT of head unremarkable. MRI of brain revealed subcentimeter acute infarction involving mesial temporal lobe on the right. Patient also had approximately 1 cm acute infarction involving posterior limb internal capsule/corona radiata on the right. MR angiogram revealed no major vessel occlusion. Carotid Dopplers revealed no significant internal carotid stenosis. Echocardiogram ejection fraction 55-60% with no regional wall motion abnormalities.  Transesophageal echo revealed ejection fraction 60% with no evidence for thrombus. No source of embolus. Patient was on aspirin prior to admission and discharged on Plavix. Symptoms fully resolved at this time.  Patient had hospital labs including total cholesterol 172, HDL 44, and LDL 104. There had been discussion of raising his Zocor to 80 mg but this was not done at discharge. Patient prefers to make lifestyle changes and followup lipids rather than changing lipids at this time. No history of smoking. No history of hypertension.   01/12/13 (LL):  Today in office, patient states he is doing very well and has no complaints. Feels like the stroke was a wake-up call and he is watching his diet more closely and exercising more. Patient denies medication side effects, no signs of bleeding. Blood pressure is  under good control.   07/20/13 (LL):  Doing well, has no complaints.  Patient denies medication side effects, no signs of bleeding. Blood pressure is under good control. He stays active with home and yard projects, exercises regularly.  REVIEW OF SYSTEMS: Full 14 system review of systems performed and notable only for: (nothing).  ALLERGIES: No Known Allergies  HOME MEDICATIONS: Outpatient Prescriptions Prior to Visit  Medication Sig Dispense Refill  . clopidogrel (PLAVIX) 75 MG tablet TAKE 1 TABLET BY MOUTH EVERY DAY WITH BREAKFAST  90 tablet  6  . furosemide (LASIX) 40 MG tablet Take 1 tablet (40 mg total) by mouth daily as needed. For fluid retention  60 tablet  3  . Omega-3 Fatty Acids (FISH OIL) 1200 MG CAPS Take 1,200 mg by mouth every evening.      . simvastatin (ZOCOR) 40 MG tablet TAKE 1 TABLET (40 MG TOTAL) BY MOUTH AT BEDTIME.  90 tablet  6  . zoster vaccine live, PF, (ZOSTAVAX) 40981 UNT/0.65ML injection Inject 19,400 Units into the skin once.  1 each  0   No facility-administered medications prior to visit.    PAST MEDICAL HISTORY: Past Medical History  Diagnosis Date  . Hyperlipidemia   . Pedal edema   . Glucose intolerance (impaired glucose tolerance)   . CKD (chronic kidney disease)   . Colonic polyp   . CLL (chronic lymphoblastic leukemia) 2002    PAST SURGICAL HISTORY: Past Surgical History  Procedure Laterality Date  . Tee without cardioversion  09/16/2012    Procedure: TRANSESOPHAGEAL ECHOCARDIOGRAM (TEE);  Surgeon: Laurey Morale, MD;  Location: [facility] ENDOSCOPY;  Service: Cardiovascular;  Laterality: N/A;    FAMILY HISTORY: Family  History  Problem Relation Age of Onset  . Stroke Mother   . Cancer Mother     uterine and thyroid  . Stroke Father   . Diabetes Father     SOCIAL HISTORY: History   Social History  . Marital Status: Married    Spouse Name: N/A    Number of Children: 3  . Years of Education: masters   Occupational History  .  retired    Social History Main Topics  . Smoking status: Never Smoker   . Smokeless tobacco: Never Used  . Alcohol Use: Yes     Comment: occassional/ a beer with every meal  . Drug Use: No  . Sexual Activity: Yes   Other Topics Concern  . Not on file   Social History Narrative  . No narrative on file     PHYSICAL EXAM  Filed Vitals:   07/20/13 1105  BP: 142/85  Pulse: 93  Height: 5\' 9"  (1.753 m)  Weight: 207 lb (93.895 kg)   Body mass index is 30.55 kg/(m^2).  Generalized: Well developed, in no acute distress  Head: normocephalic and atraumatic. Oropharynx benign  Neck: Supple, no carotid bruits  Cardiac: Regular rate rhythm, no murmur  Musculoskeletal: No deformity   Neurological examination  Mentation: Alert oriented to time, place, history taking. Follows all commands. Cranial nerve II-XII:   Pupils were equal round reactive to light extraocular movements were full, visual field were full on confrontational test. Facial sensation and strength were normal. hearing was intact to finger rubbing bilaterally. Uvula tongue midline. head turning and shoulder shrug and were normal and symmetric. Mild asymmetry of mouth/slight droop on left at rest. Mild dysarthria.  Motor: normal bulk and tone, full strength in the BUE, BLE, fine finger movements normal, no pronator drift. No focal weakness Sensory: normal and symmetric to light touch, pinprick. Coordination: finger-nose-finger, heel-to-shin bilaterally, no dysmetria Reflexes:  Deep tendon reflexes in the upper and lower extremities are present and symmetric.  Gait and Station: Rising up from seated position without assistance, normal stance, without trunk ataxia, moderate stride, good arm swing, smooth turning, able to perform tiptoe, and heel walking without difficulty.   DIAGNOSTIC DATA (LABS, IMAGING, TESTING) - I reviewed patient records, labs, notes, testing and imaging myself where available.  Lab Results  Component  Value Date   WBC 15.7* 07/15/2013   HGB 15.3 07/15/2013   HCT 45.5 07/15/2013   MCV 85.7 07/15/2013   PLT 169 07/15/2013      Component Value Date/Time   NA 140 07/15/2013 0950   K 4.1 07/15/2013 0950   CO2 23 07/15/2013 0950   GLUCOSE 150* 07/15/2013 0950   BUN 18.0 07/15/2013 0950   CREATININE 1.2 07/15/2013 0950   CALCIUM 9.1 07/15/2013 0950   PROT 6.8 07/15/2013 0950   ALBUMIN 3.8 07/15/2013 0950   AST 21 07/15/2013 0950   ALT 22 07/15/2013 0950   ALKPHOS 83 07/15/2013 0950   BILITOT 0.81 07/15/2013 0950   GFRAA 70* 09/16/2012 0545   Lab Results  Component Value Date   CHOL 172 09/15/2012   HDL 44 09/15/2012   LDLCALC 104* 09/15/2012   TRIG 121 09/15/2012   CHOLHDL 3.9 09/15/2012   Lab Results  Component Value Date   HGBA1C 6.0* 09/14/2012    ASSESSMENT AND PLAN Mr Mawson is a 72 year old male here for follow up of a mesial temporal lobe and right PLIC infarcts on 09/14/12. Left hemiparesis resolved on arrival to hospital. Infarcts  felt to be embolic secondary to unknown source. Since TEE was negative for PFO.  Outpatient telemetry did not reveal atrial fibrillation as source of stroke.  Mild residual dysarthria and left lower facial droop. CLL, CKD stable.   PLAN  Continue Plavix 75 mg orally daily for secondary stroke prevention.  Cholesterol goals are less than 200 for total cholesterol and less than 100 for LDL. Continue Zocor 40 mg and heart healthy diet.  Return for follow up appt in 6 months.  Ronal Fear, MSN, NP-C 07/20/2013, 11:11 AM [facility] [address] [place], [place] [address] [phone]  Note: This document was prepared with digital dictation and possible smart phrase technology. An transcriptional errors that result from this process are unintentional.

## 2013-07-22 ENCOUNTER — Other Ambulatory Visit: Payer: Medicare Other | Admitting: Lab

## 2013-07-22 ENCOUNTER — Telehealth: Payer: Self-pay | Admitting: Medical Oncology

## 2013-07-22 ENCOUNTER — Ambulatory Visit: Payer: Medicare Other | Admitting: Oncology

## 2013-07-22 ENCOUNTER — Ambulatory Visit (HOSPITAL_BASED_OUTPATIENT_CLINIC_OR_DEPARTMENT_OTHER): Payer: Medicare Other | Admitting: Oncology

## 2013-07-22 ENCOUNTER — Telehealth: Payer: Self-pay | Admitting: Oncology

## 2013-07-22 VITALS — BP 160/79 | HR 62 | Temp 97.4°F | Resp 20 | Ht 69.0 in | Wt 204.3 lb

## 2013-07-22 DIAGNOSIS — N189 Chronic kidney disease, unspecified: Secondary | ICD-10-CM

## 2013-07-22 DIAGNOSIS — C9110 Chronic lymphocytic leukemia of B-cell type not having achieved remission: Secondary | ICD-10-CM

## 2013-07-22 NOTE — Telephone Encounter (Signed)
lmonvm for pt re appt for 07/21/14. schedule mailed.

## 2013-07-22 NOTE — Progress Notes (Signed)
[facility] OFFICE PROGRESS NOTE  Rogelia Boga, MD [address] [place] [address]    DIAGNOSIS: 72 year old gentleman with early stage CLL diagnosed in 2002.  CURRENT THERAPY: Observation and surveillance.  INTERVAL HISTORY: Samuel Stephens 72 y.o. male returns for  Regular visit for followup for stage 0 CLL now over 10 years since his diagnosis. Since his last visit, he did develop a stroke that left him without any residual deficits and currently on Plavix. He has not reported any constitutional symptoms of weight loss, fevers, chills or any lymphadenopathy. He is to be followed by Dr. Donnie Coffin and she is here to establish care with me.  MEDICAL HISTORY: Past Medical History  Diagnosis Date  . Hyperlipidemia   . Pedal edema   . Glucose intolerance (impaired glucose tolerance)   . CKD (chronic kidney disease)   . Colonic polyp   . CLL (chronic lymphoblastic leukemia) 2002    INTERIM HISTORY: has LEUKEMIA, LYMPHOCYTIC, CHRONIC; HYPERLIPIDEMIA; BENIGN PROSTATIC HYPERTROPHY; PEDAL EDEMA; COLONIC POLYPS, HX OF; TIA (transient ischemic attack); CKD (chronic kidney disease); and CVA (cerebral infarction) on his problem list.    ALLERGIES:  has No Known Allergies.  MEDICATIONS: Samuel Stephens does not currently have medications on file.  SURGICAL HISTORY:  Past Surgical History  Procedure Laterality Date  . Tee without cardioversion  09/16/2012    Procedure: TRANSESOPHAGEAL ECHOCARDIOGRAM (TEE);  Surgeon: Laurey Morale, MD;  Location: [facility] ENDOSCOPY;  Service: Cardiovascular;  Laterality: N/A;    REVIEW OF SYSTEMS:   14 point review of systems negative apart from lower extremity edema  PHYSICAL EXAMINATION: ECOG PERFORMANCE STATUS: 0 - Asymptomatic  Filed Vitals:   07/22/13 0953  BP: 160/79  Pulse: 62  Temp: 97.4 F (36.3 C)  Resp: 20    GENERAL:alert, healthy and no distress SKIN: skin color, texture, turgor are normal HEAD:  Normocephalic EYES: PERRLA EARS: External ears normal OROPHARYNX:no exudate  NECK: no adenopathy LYMPH:  no palpable lymphadenopathy BREAST:not examined LUNGS: negative findings:  chest symmetric with normal A/P diameter HEART: regular rate & rhythm ABDOMEN:abdomen soft, non-tender, normal bowel sounds and no masses or organomegaly BACK: no skin lesions, erythema or scars EXTREMITIES:positive findings:  edema   NEURO: alert & oriented x 3 with fluent speech PELVIC: Deferred RECTAL: Deferred  LABORATORY DATA: CBC    Component Value Date/Time   WBC 15.7* 07/15/2013 0950   WBC 13.6* 09/16/2012 0545   RBC 5.31 07/15/2013 0950   RBC 5.33 09/16/2012 0545   RBC 5.63 04/04/2009 1009   HGB 15.3 07/15/2013 0950   HGB 15.2 09/16/2012 0545   HCT 45.5 07/15/2013 0950   HCT 44.7 09/16/2012 0545   PLT 169 07/15/2013 0950   PLT 144* 09/16/2012 0545   MCV 85.7 07/15/2013 0950   MCV 83.9 09/16/2012 0545   MCH 28.8 07/15/2013 0950   MCH 28.5 09/16/2012 0545   MCHC 33.6 07/15/2013 0950   MCHC 34.0 09/16/2012 0545   RDW 13.4 07/15/2013 0950   RDW 13.6 09/16/2012 0545   LYMPHSABS 11.0* 07/15/2013 0950   LYMPHSABS 10.4* 09/14/2012 1745   MONOABS 0.6 07/15/2013 0950   MONOABS 0.8 09/14/2012 1745   EOSABS 0.2 07/15/2013 0950   EOSABS 0.2 09/14/2012 1745   BASOSABS 0.0 07/15/2013 0950   BASOSABS 0.0 09/14/2012 1745      ASSESSMENT:   72 year old gentleman with stage 0 CLL without any major changes over 10 years out from his diagnosis. His continued to be asymptomatic and the  review if his CBC today showed stability over the last few years.  PLAN:  We will see Samuel Stephens back in one year's time All questions were answered. The patient knows to call the clinic with any problems, questions or concerns. We can certainly see the patient much sooner if necessary.

## 2013-07-22 NOTE — Telephone Encounter (Signed)
Patient called, stating forgot to ask Dr Clelia Croft if it's ok if he got a shingles vaccination, reviewed with MD, ok for patient to receive. Informed patient, patient states he can have the vaccine at his PCP if we can call to let them know. Fax sent to PCP @ [phone].

## 2013-07-26 ENCOUNTER — Ambulatory Visit: Payer: Self-pay | Admitting: Neurology

## 2013-08-09 ENCOUNTER — Encounter: Payer: Self-pay | Admitting: Internal Medicine

## 2013-08-09 ENCOUNTER — Ambulatory Visit (INDEPENDENT_AMBULATORY_CARE_PROVIDER_SITE_OTHER): Payer: Medicare Other | Admitting: Internal Medicine

## 2013-08-09 VITALS — BP 126/90 | HR 84 | Temp 97.7°F | Resp 20 | Wt 207.0 lb

## 2013-08-09 DIAGNOSIS — I639 Cerebral infarction, unspecified: Secondary | ICD-10-CM

## 2013-08-09 DIAGNOSIS — M720 Palmar fascial fibromatosis [Dupuytren]: Secondary | ICD-10-CM

## 2013-08-09 DIAGNOSIS — Z23 Encounter for immunization: Secondary | ICD-10-CM

## 2013-08-09 DIAGNOSIS — E785 Hyperlipidemia, unspecified: Secondary | ICD-10-CM

## 2013-08-09 DIAGNOSIS — I6350 Cerebral infarction due to unspecified occlusion or stenosis of unspecified cerebral artery: Secondary | ICD-10-CM

## 2013-08-09 DIAGNOSIS — C9110 Chronic lymphocytic leukemia of B-cell type not having achieved remission: Secondary | ICD-10-CM

## 2013-08-09 DIAGNOSIS — Z2911 Encounter for prophylactic immunotherapy for respiratory syncytial virus (RSV): Secondary | ICD-10-CM

## 2013-08-09 NOTE — Progress Notes (Signed)
Pre-visit discussion using our clinic review tool. No additional management support is needed unless otherwise documented below in the visit note.  

## 2013-08-09 NOTE — Progress Notes (Signed)
Subjective:    Patient ID: Samuel Stephens, male    DOB: 1940-10-23, 72 y.o.   MRN: [id number]  HPI 72 year old patient has stable medical problems include dyslipidemia mild chronic kidney disease CVD and CLL. In the past several months he has had the contracture involving the primarily his left fifth finger he generally wakes in the morning with the a flexion contracture it requires manual manipulation but once the finger is in full extension he has no further difficulties throughout the day is also some involvement of the left fourth finger. He was seen here today for a shingles vaccine and this seems to be a fairly minor problem although he is left handed  Past Medical History  Diagnosis Date  . Hyperlipidemia   . Pedal edema   . Glucose intolerance (impaired glucose tolerance)   . CKD (chronic kidney disease)   . Colonic polyp   . CLL (chronic lymphoblastic leukemia) 2002    History   Social History  . Marital Status: Married    Spouse Name: N/A    Number of Children: 3  . Years of Education: masters   Occupational History  . retired    Social History Main Topics  . Smoking status: Never Smoker   . Smokeless tobacco: Never Used  . Alcohol Use: Yes     Comment: occassional/ a beer with every meal  . Drug Use: No  . Sexual Activity: Yes   Other Topics Concern  . Not on file   Social History Narrative  . No narrative on file    Past Surgical History  Procedure Laterality Date  . Tee without cardioversion  09/16/2012    Procedure: TRANSESOPHAGEAL ECHOCARDIOGRAM (TEE);  Surgeon: Laurey Morale, MD;  Location: [facility] ENDOSCOPY;  Service: Cardiovascular;  Laterality: N/A;    Family History  Problem Relation Age of Onset  . Stroke Mother   . Cancer Mother     uterine and thyroid  . Stroke Father   . Diabetes Father     No Known Allergies  Current Outpatient Prescriptions on File Prior to Visit  Medication Sig Dispense Refill  . clopidogrel (PLAVIX) 75 MG  tablet TAKE 1 TABLET BY MOUTH EVERY DAY WITH BREAKFAST  90 tablet  6  . furosemide (LASIX) 40 MG tablet Take 1 tablet (40 mg total) by mouth daily as needed. For fluid retention  60 tablet  3  . Omega-3 Fatty Acids (FISH OIL) 1200 MG CAPS Take 1,200 mg by mouth every evening.      . simvastatin (ZOCOR) 40 MG tablet TAKE 1 TABLET (40 MG TOTAL) BY MOUTH AT BEDTIME.  90 tablet  6   No current facility-administered medications on file prior to visit.    BP 126/90  Pulse 84  Temp(Src) 97.7 F (36.5 C) (Oral)  Resp 20  Wt 207 lb (93.895 kg)  SpO2 99%        Review of Systems  Constitutional: Negative for fever, chills, appetite change and fatigue.  HENT: Negative for congestion, dental problem, ear pain, hearing loss, sore throat, tinnitus, trouble swallowing and voice change.   Eyes: Negative for pain, discharge and visual disturbance.  Respiratory: Negative for cough, chest tightness, wheezing and stridor.   Cardiovascular: Negative for chest pain, palpitations and leg swelling.  Gastrointestinal: Negative for nausea, vomiting, abdominal pain, diarrhea, constipation, blood in stool and abdominal distention.  Genitourinary: Negative for urgency, hematuria, flank pain, discharge, difficulty urinating and genital sores.  Musculoskeletal: Negative for arthralgias, back  pain, gait problem, joint swelling, myalgias and neck stiffness.  Skin: Negative for rash.  Neurological: Negative for dizziness, syncope, speech difficulty, weakness, numbness and headaches.  Hematological: Negative for adenopathy. Does not bruise/bleed easily.  Psychiatric/Behavioral: Negative for behavioral problems and dysphoric mood. The patient is not nervous/anxious.        Objective:   Physical Exam  Constitutional: He appears well-developed and well-nourished. No distress.  130/80  Musculoskeletal:  Full flexion and extension of the left fourth and fifth fingers.. There is a popping of the left fifth finger  when  the patient extends from a flexed position          Assessment & Plan:  Dupuytren's contracture. Will observe. CPX 6 months Dyslipidemia Chronic kidney disease stable

## 2013-08-09 NOTE — Patient Instructions (Signed)
Dupuytren's Contracture  Dupuytren's contracture affects the fingers and the palm of the hand. This condition usually develops slowly. It may take many years to develop. The pinky finger and the ring finger are most often affected. These fingers start to curve inward, like a claw. At some point, the fingers cannot go straight anymore. This can make it hard to do things like:  · Put on gloves.  · Shake hands.  · Grab something off a shelf.  The condition usually does not cause pain and is not dangerous.  The condition gets its name from the doctor who came up with an operation to fix the problem. His name was Baron Guillaume Dupuytren. Contracture means pulling inward.  CAUSES   Dupuytren's contracture does not start with the fingers. It starts in the palm of the hand, under the skin. The tissue under the skin is called fascia. The fascia covers the cords (tendons) that control how the fingers move. In Dupuytren's contracture the fascia tissue becomes thick and then pulls on the cords. That causes the fingers to curl. The condition can affect both hands and any fingers, but it usually strikes one hand worse than the other. The fingers farthest from the thumb are most often the ones that curl. The cause is not clear. Some experts believe it results from an autoimmune reaction. That means the body's immune system (which fights off disease) attacks itself by mistake. What experts do know is that certain conditions and behaviors (called risk factors) make the chance of having this condition more likely. They include:  · Age. Most people who have the condition are older than 50.  · Sex. It affects men more often than women.  · Family history. The condition tends to run in families from countries in [place] and [place].  · Certain behaviors. People who smoke and drink alcohol are more apt to develop the problem.  · Some other medical conditions. Having diabetes makes Dupuytren's contracture more likely. So does  having a condition that involves a seizure (when the brain's function is interrupted).  SYMPTOMS   Signs of this condition take time to develop. Sometimes this takes weeks or months. More often, it takes several years.   · Early symptoms:  · Skin on the palm of the hand becomes thick. This is usually the first sign.  · The skin may look dimpled or puckered.  · Lumps (nodules) show up on the palm. There may be one or more lumps. They are not painful.  · Later symptoms:  · Thick cords of tissue form in the palm of the hand.  · The pinky and ring fingers start to curl up into the palm.  · The fingers cannot be straightened into their normal position.  DIAGNOSIS   A physical examination is the main way that a healthcare provider can tell if you have Dupuytren's contracture. Other tests usually are not needed. The caregiver will probably:  · Look at your hands. Feel your hands. This is to check for thickening and nodules.  · Measure finger motion. This tells how much your fingers have contracted (pulled in).  · Do a tabletop test. You will be asked to try to put your hand flat on a table, palm down.  TREATMENT   There is no cure for Dupuytren's contracture. But there are ways to treat the symptoms. Options include:  · Watching and waiting. The condition develops slowly. Often it does not create problems for a long time. Sometimes the   skin gets thick and nodules form, but the fingers never curl. So, in some cases it is best to just watch the condition carefully and wait to see what happens.  · Shots (injections). Different substances may be injected, including:  · Steroids. These drugs block swelling. These shots should make the condition less uncomfortable. Steroids may also slow down the condition. Shots are given into the nodules. The effect only lasts awhile. More shots may have to be given.  · Enzymes. These are proteins. They weaken the thick tissue. After an injection, the caregiver usually stretches the  fingers.  · Needling. A needle is pushed through the skin and into the thick tissue. This is done in several spots. The goal is to break up the thickened tissue. Or to weaken it.  · Surgery. This may be suggested if you cannot grasp objects. Or, if you can no longer put your hand in your pocket.  · A cut (incision) is made in the palm of the hand. The thick tissue is removed.  · Sometimes the thick tissue is attached to the skin. Then, the skin must be removed, too. It is replaced with a piece of skin from another place on your body. That is called a skin graft.  · Occupational or hand therapy is almost always needed after surgery. This involves special exercises to get back the use of your hand and fingers. After a skin graft, several months of therapy may be needed.  · Sometimes the condition comes back, even after surgery.  · Other methods. You can do some things on your own. They include:  · Stretching the fingers backwards. Do this often.  · Warming the hand and massaging it. Again, do this often.  · Using tools with padded grips. This should make things easier.  · Wearing heavy gloves while working. This protects the hands.  PROGNOSIS   Dupuytren's contracture usually develops slowly. There is no cure. But, the symptoms can be treated. Sometimes they come back after treatment, but not always. It is important to remember that this is a functional problem and not a life-threatening condition.  Document Released: 06/15/2009 Document Revised: 11/10/2011 Document Reviewed: 06/15/2009  ExitCare® Patient Information ©2014 [employer].

## 2013-10-09 ENCOUNTER — Encounter: Payer: Self-pay | Admitting: Internal Medicine

## 2013-10-20 ENCOUNTER — Encounter: Payer: Self-pay | Admitting: Internal Medicine

## 2013-11-22 ENCOUNTER — Encounter: Payer: Self-pay | Admitting: Internal Medicine

## 2014-01-11 ENCOUNTER — Other Ambulatory Visit (INDEPENDENT_AMBULATORY_CARE_PROVIDER_SITE_OTHER): Payer: Medicare Other

## 2014-01-11 DIAGNOSIS — E785 Hyperlipidemia, unspecified: Secondary | ICD-10-CM

## 2014-01-11 DIAGNOSIS — Z0000 Encounter for general adult medical examination without abnormal findings: Secondary | ICD-10-CM

## 2014-01-11 LAB — LIPID PANEL
CHOLESTEROL: 170 mg/dL (ref 0–200)
HDL: 46.8 mg/dL (ref >39.00)
LDL Cholesterol: 104 mg/dL — ABNORMAL HIGH (ref 0–99)
TRIGLYCERIDES: 98 mg/dL (ref 0.0–149.0)
Total CHOL/HDL Ratio: 4
VLDL: 19.6 mg/dL (ref 0.0–40.0)

## 2014-01-11 LAB — CBC WITH DIFFERENTIAL/PLATELET
BASOS PCT: 0.3 % (ref 0.0–3.0)
Basophils Absolute: 0 10*3/uL (ref 0.0–0.1)
EOS ABS: 0.3 10*3/uL (ref 0.0–0.7)
Eosinophils Relative: 2.1 % (ref 0.0–5.0)
HCT: 47.2 % (ref 39.0–52.0)
HEMOGLOBIN: 15.9 g/dL (ref 13.0–17.0)
LYMPHS ABS: 11.6 10*3/uL — AB (ref 0.7–4.0)
Lymphocytes Relative: 71.9 % — ABNORMAL HIGH (ref 12.0–46.0)
MCHC: 33.8 g/dL (ref 30.0–36.0)
MCV: 86.8 fl (ref 78.0–100.0)
MONO ABS: 0.6 10*3/uL (ref 0.1–1.0)
Monocytes Relative: 3.9 % (ref 3.0–12.0)
NEUTROS PCT: 21.8 % — AB (ref 43.0–77.0)
Neutro Abs: 3.5 10*3/uL (ref 1.4–7.7)
PLATELETS: 190 10*3/uL (ref 150.0–400.0)
RBC: 5.43 Mil/uL (ref 4.22–5.81)
RDW: 13.5 % (ref 11.5–15.5)
WBC: 16.1 10*3/uL — AB (ref 4.0–10.5)

## 2014-01-11 LAB — BASIC METABOLIC PANEL
BUN: 21 mg/dL (ref 6–23)
CALCIUM: 9.2 mg/dL (ref 8.4–10.5)
CO2: 28 mEq/L (ref 19–32)
Chloride: 105 mEq/L (ref 96–112)
Creatinine, Ser: 1.2 mg/dL (ref 0.4–1.5)
GFR: 60.89 mL/min (ref >60.00)
GLUCOSE: 97 mg/dL (ref 70–99)
Potassium: 4.4 mEq/L (ref 3.5–5.1)
SODIUM: 138 meq/L (ref 135–145)

## 2014-01-11 LAB — POCT URINALYSIS DIPSTICK
Bilirubin, UA: NEGATIVE
Blood, UA: NEGATIVE
GLUCOSE UA: NEGATIVE
KETONES UA: NEGATIVE
LEUKOCYTES UA: NEGATIVE
NITRITE UA: NEGATIVE
PH UA: 6
Protein, UA: NEGATIVE
Spec Grav, UA: 1.02
Urobilinogen, UA: 0.2

## 2014-01-11 LAB — HEPATIC FUNCTION PANEL
ALT: 21 U/L (ref 0–53)
AST: 21 U/L (ref 0–37)
Albumin: 3.8 g/dL (ref 3.5–5.2)
Alkaline Phosphatase: 60 U/L (ref 39–117)
BILIRUBIN TOTAL: 1.3 mg/dL — AB (ref 0.2–1.2)
Bilirubin, Direct: 0.2 mg/dL (ref 0.0–0.3)
Total Protein: 6.2 g/dL (ref 6.0–8.3)

## 2014-01-11 LAB — TSH: TSH: 1.6 u[IU]/mL (ref 0.35–4.50)

## 2014-01-11 LAB — PSA: PSA: 2.01 ng/mL (ref 0.10–4.00)

## 2014-01-13 ENCOUNTER — Telehealth: Payer: Self-pay

## 2014-01-13 ENCOUNTER — Ambulatory Visit (INDEPENDENT_AMBULATORY_CARE_PROVIDER_SITE_OTHER): Payer: Medicare Other | Admitting: Internal Medicine

## 2014-01-13 ENCOUNTER — Encounter: Payer: Self-pay | Admitting: Internal Medicine

## 2014-01-13 VITALS — BP 110/72 | HR 82 | Ht 69.0 in | Wt 206.0 lb

## 2014-01-13 DIAGNOSIS — Z860100 Personal history of colon polyps, unspecified: Secondary | ICD-10-CM

## 2014-01-13 MED ORDER — MOVIPREP 100 G PO SOLR: ORAL | Status: DC

## 2014-01-13 NOTE — Patient Instructions (Signed)
You have been scheduled for a colonoscopy with propofol. Please follow written instructions given to you at your visit today.  Please pick up your prep kit at the pharmacy within the next 1-3 days.  Use your free coupon. If you use inhalers (even only as needed), please bring them with you on the day of your procedure. Your physician has requested that you go to www.startemmi.com and enter the access code given to you at your visit today. This web site gives a general overview about your procedure. However, you should still follow specific instructions given to you by our office regarding your preparation for the procedure.   You will be contaced by our office prior to your procedure for directions on holding your Plavix.  If you do not hear from our office 1 week prior to your scheduled procedure, please call [phone] to discuss.   I appreciate the opportunity to care for you.

## 2014-01-13 NOTE — Progress Notes (Signed)
         Subjective:    Patient ID: Samuel Stephens, male    DOB: 11/01/1940, 73 y.o.   MRN: [id number]  HPI This man has a hx of adenomatous colon polyps, last in 2012. He is due for a routine surveillance colonoscopy. 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 He has no GI complaints today.  No Known Allergies Outpatient Prescriptions Prior to Visit  Medication Sig Dispense Refill  . clopidogrel (PLAVIX) 75 MG tablet TAKE 1 TABLET BY MOUTH EVERY DAY WITH BREAKFAST  90 tablet  6  . furosemide (LASIX) 40 MG tablet Take 1 tablet (40 mg total) by mouth daily as needed. For fluid retention  60 tablet  3  . Omega-3 Fatty Acids (FISH OIL) 1200 MG CAPS Take 1,200 mg by mouth every evening.      . simvastatin (ZOCOR) 40 MG tablet TAKE 1 TABLET (40 MG TOTAL) BY MOUTH AT BEDTIME.  90 tablet  6   No facility-administered medications prior to visit.   Past Medical History  Diagnosis Date  . Hyperlipidemia   . Pedal edema   . Glucose intolerance (impaired glucose tolerance)   . CKD (chronic kidney disease)   . CLL (chronic lymphoblastic leukemia) 2002  . COLONIC POLYPS, HX OF- Adenomas 03/26/2007         Past Surgical History  Procedure Laterality Date  . Tee without cardioversion  09/16/2012    Procedure: TRANSESOPHAGEAL ECHOCARDIOGRAM (TEE);  Surgeon: Larey Dresser, MD;  Location: [place] [facility];  Service: Cardiovascular;  Laterality: N/A;  . Colonoscopy w/ biopsies      multiple   Review of Systems As above    Objective:   Physical Exam General:  NAD Lungs:  clear Heart:  S1S2 no rubs, murmurs or gallops   Data Reviewed:  Prior colonoscpies and pathology Hospital notes from CVA admit        Assessment & Plan:  COLONIC POLYPS, HX OF- Adenomas Appropriate to repeat a colonoscopy The risks and benefits as well as alternatives of endoscopic procedure(s) have been discussed and reviewed. All questions answered.  The patient agrees to proceed. Best to hold clopidogrel 5-7 days prior to reduce bleeding risks Rare extra risk of stroke off clopidogrel discussed Will ask neurology if ok to hold it

## 2014-01-13 NOTE — Assessment & Plan Note (Signed)
Appropriate to repeat a colonoscopy The risks and benefits as well as alternatives of endoscopic procedure(s) have been discussed and reviewed. All questions answered. The patient agrees to proceed. Best to hold clopidogrel 5-7 days prior to reduce bleeding risks Rare extra risk of stroke off clopidogrel discussed Will ask neurology if ok to hold it

## 2014-01-13 NOTE — Telephone Encounter (Signed)
[facility] [address]  [place] [address]  01/13/2014   RE: Samuel Stephens DOB: [birth year] MRN: [id number]   Dear Dr. Bluford Kaufmann,    We have scheduled the above patient for an endoscopic procedure. Our records show that he is on anticoagulation therapy.   Please advise as to how long the patient may come off his therapy of Plavix prior to the colonoscopy procedure, which is scheduled for 01/27/14.Marland Kitchen  Please fax back/ or route the completed form to Shaquia Berkley [place], [place] at [phone].   Sincerely,    Dr. Silvano Rusk

## 2014-01-16 NOTE — Telephone Encounter (Signed)
OK to stop plavix 5 days prior to procedure

## 2014-01-16 NOTE — Telephone Encounter (Signed)
Patient informed to hold plavix 5 days prior to colonoscopy, he verbalized understanding.

## 2014-01-17 ENCOUNTER — Encounter: Payer: Self-pay | Admitting: Internal Medicine

## 2014-01-18 ENCOUNTER — Encounter: Payer: Self-pay | Admitting: Internal Medicine

## 2014-01-18 ENCOUNTER — Ambulatory Visit (INDEPENDENT_AMBULATORY_CARE_PROVIDER_SITE_OTHER): Payer: Medicare Other | Admitting: Internal Medicine

## 2014-01-18 VITALS — BP 130/80 | HR 91 | Temp 97.8°F | Resp 20 | Ht 69.5 in | Wt 201.0 lb

## 2014-01-18 DIAGNOSIS — I639 Cerebral infarction, unspecified: Secondary | ICD-10-CM

## 2014-01-18 DIAGNOSIS — E785 Hyperlipidemia, unspecified: Secondary | ICD-10-CM

## 2014-01-18 DIAGNOSIS — G459 Transient cerebral ischemic attack, unspecified: Secondary | ICD-10-CM

## 2014-01-18 DIAGNOSIS — Z0000 Encounter for general adult medical examination without abnormal findings: Secondary | ICD-10-CM

## 2014-01-18 DIAGNOSIS — C9110 Chronic lymphocytic leukemia of B-cell type not having achieved remission: Secondary | ICD-10-CM

## 2014-01-18 DIAGNOSIS — R7302 Impaired glucose tolerance (oral): Secondary | ICD-10-CM | POA: Insufficient documentation

## 2014-01-18 DIAGNOSIS — R7309 Other abnormal glucose: Secondary | ICD-10-CM

## 2014-01-18 DIAGNOSIS — I6350 Cerebral infarction due to unspecified occlusion or stenosis of unspecified cerebral artery: Secondary | ICD-10-CM

## 2014-01-18 DIAGNOSIS — Z23 Encounter for immunization: Secondary | ICD-10-CM

## 2014-01-18 DIAGNOSIS — N189 Chronic kidney disease, unspecified: Secondary | ICD-10-CM

## 2014-01-18 DIAGNOSIS — N400 Enlarged prostate without lower urinary tract symptoms: Secondary | ICD-10-CM

## 2014-01-18 DIAGNOSIS — R609 Edema, unspecified: Secondary | ICD-10-CM

## 2014-01-18 DIAGNOSIS — Z86010 Personal history of colonic polyps: Secondary | ICD-10-CM

## 2014-01-18 MED ORDER — SIMVASTATIN 40 MG PO TABS: ORAL_TABLET | ORAL | Status: DC

## 2014-01-18 MED ORDER — CLOPIDOGREL BISULFATE 75 MG PO TABS: ORAL_TABLET | ORAL | Status: DC

## 2014-01-18 MED ORDER — FUROSEMIDE 40 MG PO TABS: 40.0000 mg | ORAL_TABLET | Freq: Every day | ORAL | Status: DC | PRN

## 2014-01-18 NOTE — Patient Instructions (Signed)
Limit your sodium (Salt) intake  Return in one year for follow-up    It is important that you exercise regularly, at least 20 minutes 3 to 4 times per week.  If you develop chest pain or shortness of breath seek  medical attention.  Schedule your colonoscopy to help detect colon cancer.

## 2014-01-18 NOTE — Progress Notes (Signed)
Patient ID: Samuel Stephens, male   DOB: 08-26-1941, 73 y.o.   MRN: [id number]  Subjective:    Patient ID: Samuel Stephens, male    DOB: [birth year], 26 y.o.   MRN: [id number]  HPI 73 year-old patient who is seen today for a wellness exam.  He is followed by hematology annually for stable CLL. He has a history of mild dyslipidemia BPH and a history of colonic polyps. He had followup colonoscopy in February of 2012 and is scheduled for followup in 10 days. He feels quite well today no concerns or complaints. He still teaches and remains quite active. He denies any cardiopulmonary complaints.  He was hospitalized briefly in January of last year for a stroke resulted in left lower extremity weakness. He had a full neurological evaluation and has been evaluated by neurology.  He remains on Plavix for secondary prevention  Social history works part-time Research scientist (medical) at [employer] History  Diagnosis Date  . Hyperlipidemia   . Pedal edema   . Glucose intolerance (impaired glucose tolerance)   . CKD (chronic kidney disease)   . CLL (chronic lymphoblastic leukemia) 2002  . 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 Marital Status: Married    Spouse Name: N/A    Number of Children: 3  . Years of Education: masters   Occupational History  . retired    Social History Main Topics  . Smoking status: Never Smoker   . Smokeless tobacco: Never Used  . Alcohol Use: Yes     Comment: occassional/ a beer with every meal  . Drug Use: No  . Sexual Activity: Yes   Other Topics Concern  . Not on file   Social History Narrative  . No narrative on file    Past Surgical History  Procedure Laterality Date  . Tee without cardioversion  09/16/2012    Procedure: TRANSESOPHAGEAL ECHOCARDIOGRAM (TEE);  Surgeon: Larey Dresser, MD;  Location: [facility] ENDOSCOPY;  Service: Cardiovascular;  Laterality: N/A;  . Colonoscopy w/ biopsies      multiple     Family History  Problem Relation Age of Onset  . Stroke Mother   . Uterine cancer Mother   . Stroke Father   . Diabetes Father   . Thyroid cancer Mother     No Known Allergies  Current Outpatient Prescriptions on File Prior to Visit  Medication Sig Dispense Refill  . Omega-3 Fatty Acids (FISH OIL) 1200 MG CAPS Take 1,200 mg by mouth every evening.      Marland Kitchen MOVIPREP 100 G SOLR Use per prep instructions  1 kit  0   No current facility-administered medications on file prior to visit.    BP 130/80  Pulse 91  Temp(Src) 97.8 F (36.6 C) (Oral)  Resp 20  Ht 5' 9.5" (1.765 m)  Wt 201 lb (91.173 kg)  BMI 29.27 kg/m2  SpO2 96%  Allergies (verified):  No Known Drug Allergies   Past History:  Past Medical History:  Hyperlipidemia  CLL: dx '02  Benign prostatic hypertrophy-mild  pedal edema  impaired glucose tolerance  CKD  Colonic polyps, hx of   Past Surgical History:   none  colonoscopy 2008 2015  Family History:   F deceased at 53: CVA,tobacco, DM  M deceased at 14: CVA, uterine and thyroid ca  4 B & 2 S: + hypercholesterolemia; one sister died complications of Rheumatic  valvular disease  Family History Diabetes 1st degree relative  Family History of Stroke F 1st degree relative <60  Family History Thyroid disease  Family History Uterine cancer   Social History:   Research scientist (medical) at [employer]; 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 Risk factors, based on past  M,S,F history-  cardiovascular as factors include dyslipidemia and a history of cerebrovascular disease  2.  Physical activities: Remains active no exercise limitations  3.  Depression/mood: No history depression or mood disorder  4.  Hearing: No deficits  5.  ADL's: Independent in all aspects of daily living  6.  Fall risk: Low  7.  Home safety: No problems identified  8.  Height weight, and visual acuity; height and weight stable no change in visual acuity  9.   Counseling: Nonapplicable  10. Lab orders based on risk factors: Recent lab performed in the hospital in January  11. Referral : Followup neurology and hematology  12. Care plan: Continue low-salt diet regular exercise regimen prune eating habits  13. Cognitive assessment: Alert and oriented with normal affect. No cognitive dysfunction       Review of Systems  Constitutional: Negative for fever, chills, activity change, appetite change and fatigue.  HENT: Negative for congestion, dental problem, ear pain, hearing loss, mouth sores, rhinorrhea, sinus pressure, sneezing, tinnitus, trouble swallowing and voice change.   Eyes: Negative for photophobia, pain, redness and visual disturbance.  Respiratory: Negative for apnea, cough, choking, chest tightness, shortness of breath and wheezing.   Cardiovascular: Negative for chest pain, palpitations and leg swelling.  Gastrointestinal: Negative for nausea, vomiting, abdominal pain, diarrhea, constipation, blood in stool, abdominal distention, anal bleeding and rectal pain.  Genitourinary: Negative for dysuria, urgency, frequency, hematuria, flank pain, decreased urine volume, discharge, penile swelling, scrotal swelling, difficulty urinating, genital sores and testicular pain.  Musculoskeletal: Negative for arthralgias, back pain, gait problem, joint swelling, myalgias, neck pain and neck stiffness.  Skin: Negative for color change, rash and wound.  Neurological: Negative for dizziness, tremors, seizures, syncope, facial asymmetry, speech difficulty, weakness, light-headedness, numbness and headaches.  Hematological: Negative for adenopathy. Does not bruise/bleed easily.  Psychiatric/Behavioral: Negative for suicidal ideas, hallucinations, behavioral problems, confusion, sleep disturbance, self-injury, dysphoric mood, decreased concentration and agitation. The patient is not nervous/anxious.        Objective:   Physical Exam  Constitutional:  He appears well-developed and well-nourished.  HENT:  Head: Normocephalic and atraumatic.  Right Ear: External ear normal.  Left Ear: External ear normal.  Nose: Nose normal.  Mouth/Throat: Oropharynx is clear and moist.  Eyes: Conjunctivae and EOM are normal. Pupils are equal, round, and reactive to light. No scleral icterus.  Neck: Normal range of motion. Neck supple. No JVD present. No thyromegaly present.  Cardiovascular: Regular rhythm, normal heart sounds and intact distal pulses.  Exam reveals no gallop and no friction rub.   No murmur heard. Pedal pulses intact  Pulmonary/Chest: Effort normal and breath sounds normal. He exhibits no tenderness.  Abdominal: Soft. Bowel sounds are normal. He exhibits no distension and no mass. There is no tenderness.  Genitourinary: Penis normal.  Prostate  examination deferred.  Colonoscopy scheduled in 10 days  Musculoskeletal: Normal range of motion. He exhibits edema. He exhibits no tenderness.  Plus 1 peripheral edema  Lymphadenopathy:    He has no cervical adenopathy.  Neurological: He is alert. He has normal reflexes. No cranial nerve deficit. Coordination normal.  Skin: Skin is warm and  dry. No rash noted.  Psychiatric: He has a normal mood and affect. His behavior is normal.          Assessment & Plan:   Preventive health examination CLL stable BPH mild and largely asymptomatic History of colonic polyps followup colonoscopy in 2017

## 2014-01-18 NOTE — Progress Notes (Signed)
Pre-visit discussion using our clinic review tool. No additional management support is needed unless otherwise documented below in the visit note.  

## 2014-01-27 ENCOUNTER — Ambulatory Visit (AMBULATORY_SURGERY_CENTER): Payer: Medicare Other | Admitting: Internal Medicine

## 2014-01-27 ENCOUNTER — Encounter: Payer: Self-pay | Admitting: Internal Medicine

## 2014-01-27 VITALS — BP 117/73 | HR 62 | Temp 97.2°F | Resp 10 | Ht 69.5 in | Wt 201.0 lb

## 2014-01-27 DIAGNOSIS — D129 Benign neoplasm of anus and anal canal: Secondary | ICD-10-CM

## 2014-01-27 DIAGNOSIS — Z86010 Personal history of colonic polyps: Secondary | ICD-10-CM

## 2014-01-27 DIAGNOSIS — D126 Benign neoplasm of colon, unspecified: Secondary | ICD-10-CM

## 2014-01-27 DIAGNOSIS — D128 Benign neoplasm of rectum: Secondary | ICD-10-CM

## 2014-01-27 MED ORDER — SODIUM CHLORIDE 0.9 % IV SOLN: 500.0000 mL | INTRAVENOUS | Status: DC

## 2014-01-27 NOTE — Op Note (Signed)
[place]  [address] [place], [address]   COLONOSCOPY PROCEDURE REPORT  PATIENT: Samuel Stephens, Samuel Stephens  MR#: [id number] BIRTHDATE: [birth year] , 73  yrs. old GENDER: Male ENDOSCOPIST: Gatha Mayer, MD, [facility] PROCEDURE DATE:  01/27/2014 PROCEDURE:   Colonoscopy with biopsy First Screening Colonoscopy - Avg.  risk and is 50 yrs.  old or older - No.  Prior Negative Screening - Now for repeat screening. N/A  History of Adenoma - Now for follow-up colonoscopy & has been > or = to 3 yrs.  Yes hx of adenoma.  Has been 3 or more years since last colonoscopy.  Polyps Removed Today? Yes. ASA CLASS:   Class III INDICATIONS:Patient's personal history of adenomatous colon polyps.  MEDICATIONS: propofol (Diprivan) 150mg  IV, MAC sedation, administered by CRNA, and These medications were titrated to patient response per physician's verbal order  DESCRIPTION OF PROCEDURE:   After the risks benefits and alternatives of the procedure were thoroughly explained, informed consent was obtained.  A digital rectal exam revealed no abnormalities of the rectum.   The [id number]  endoscope was introduced through the anus and advanced to the cecum, which was identified by both the appendix and ileocecal valve. No adverse events experienced.   The quality of the prep was excellent, using MoviPrep  The instrument was then slowly withdrawn as the colon was fully examined.  COLON FINDINGS: A diminutive sessile polyp was found at the cecum. A polypectomy was performed with cold forceps.  The resection was complete and the polyp tissue was completely retrieved.   The colon mucosa was otherwise normal.   A right colon retroflexion was performed.  Retroflexed views revealed internal hemorrhoids. The time to cecum=2 minutes 02 seconds.  Withdrawal time=7 minutes 46 seconds.  The scope was withdrawn and the procedure completed. COMPLICATIONS: There were no complications.  ENDOSCOPIC  IMPRESSION: 1.   Diminutive sessile polyp was found at the cecum; polypectomy was performed with cold forceps 2.   The colon mucosa was otherwise normal - excellent prep - hx adenomas  RECOMMENDATIONS: 1.  Timing of repeat colonoscopy will be determined by pathology findings. 2.  Resume Plavix today   eSigned:  Gatha Mayer, MD, [facility] 01/27/2014 11:59 AM   cc: The Patient

## 2014-01-27 NOTE — Progress Notes (Signed)
Called to room to assist during endoscopic procedure.  Patient ID and intended procedure confirmed with present staff. Received instructions for my participation in the procedure from the performing physician.  

## 2014-01-27 NOTE — Patient Instructions (Addendum)
I found and removed one small polyp.  We can consider repeating a colonoscopy in 5 years.  I will let you know pathology results and when to have another routine colonoscopy by mail.  I appreciate the opportunity to care for you. Gatha Mayer, MD, FACG  YOU HAD AN ENDOSCOPIC PROCEDURE TODAY AT [facility]: Refer to the procedure report that was given to you for any specific questions about what was found during the examination.  If the procedure report does not answer your questions, please call your gastroenterologist to clarify.  If you requested that your care partner not be given the details of your procedure findings, then the procedure report has been included in a sealed envelope for you to review at your convenience later.  YOU SHOULD EXPECT: Some feelings of bloating in the abdomen. Passage of more gas than usual.  Walking can help get rid of the air that was put into your GI tract during the procedure and reduce the bloating. If you had a lower endoscopy (such as a colonoscopy or flexible sigmoidoscopy) you may notice spotting of blood in your stool or on the toilet paper. If you underwent a bowel prep for your procedure, then you may not have a normal bowel movement for a few days.  DIET: Your first meal following the procedure should be a light meal and then it is ok to progress to your normal diet.  A half-sandwich or bowl of soup is an example of a good first meal.  Heavy or fried foods are harder to digest and may make you feel nauseous or bloated.  Likewise meals heavy in dairy and vegetables can cause extra gas to form and this can also increase the bloating.  Drink plenty of fluids but you should avoid alcoholic beverages for 24 hours.  ACTIVITY: Your care partner should take you home directly after the procedure.  You should plan to take it easy, moving slowly for the rest of the day.  You can resume normal activity the day after the procedure however you should  NOT DRIVE or use heavy machinery for 24 hours (because of the sedation medicines used during the test).    SYMPTOMS TO REPORT IMMEDIATELY: A gastroenterologist can be reached at any hour.  During normal business hours, 8:30 AM to 5:00 PM Monday through Friday, call [phone].  After hours and on weekends, please call the GI answering service at [phone] who will take a message and have the physician on call contact you.   Following lower endoscopy (colonoscopy or flexible sigmoidoscopy):  Excessive amounts of blood in the stool  Significant tenderness or worsening of abdominal pains  Swelling of the abdomen that is new, acute  Fever of 100F or higher  FOLLOW UP: If any biopsies were taken you will be contacted by phone or by letter within the next 1-3 weeks.  Call your gastroenterologist if you have not heard about the biopsies in 3 weeks.  Our staff will call the home number listed on your records the next business day following your procedure to check on you and address any questions or concerns that you may have at that time regarding the information given to you following your procedure. This is a courtesy call and so if there is no answer at the home number and we have not heard from you through the emergency physician on call, we will assume that you have returned to your regular daily activities without incident.  SIGNATURES/CONFIDENTIALITY: You and/or your care partner have signed paperwork which will be entered into your electronic medical record.  These signatures attest to the fact that that the information above on your After Visit Summary has been reviewed and is understood.  Full responsibility of the confidentiality of this discharge information lies with you and/or your care-partner.  Recommendations Resume Plavix today Await pathology, next colonoscopy determined by pathology results

## 2014-01-27 NOTE — Progress Notes (Signed)
Report to PACU, RN, vss, BBS= Clear.  

## 2014-01-30 ENCOUNTER — Telehealth: Payer: Self-pay | Admitting: *Deleted

## 2014-01-30 NOTE — Telephone Encounter (Signed)
  Follow up Call-  Call back number 01/27/2014  Post procedure Call Back phone  # [phone]  Permission to leave phone message Yes     Patient questions:  Do you have a fever, pain , or abdominal swelling? no Pain Score  0 *  Have you tolerated food without any problems? yes  Have you been able to return to your normal activities? yes  Do you have any questions about your discharge instructions: Diet   no Medications  no Follow up visit  no  Do you have questions or concerns about your Care? no  Actions: * If pain score is 4 or above: No action needed, pain <4.

## 2014-02-02 ENCOUNTER — Encounter: Payer: Self-pay | Admitting: Internal Medicine

## 2014-02-02 NOTE — Progress Notes (Signed)
Quick Note:  Not Polyp Repeat colon 2020 hx polyps ______

## 2014-02-06 ENCOUNTER — Other Ambulatory Visit: Payer: Self-pay | Admitting: Internal Medicine

## 2014-04-15 ENCOUNTER — Other Ambulatory Visit: Payer: Self-pay | Admitting: Internal Medicine

## 2014-05-13 ENCOUNTER — Other Ambulatory Visit: Payer: Self-pay | Admitting: Internal Medicine

## 2014-06-16 ENCOUNTER — Other Ambulatory Visit: Payer: Self-pay

## 2014-07-21 ENCOUNTER — Other Ambulatory Visit (HOSPITAL_BASED_OUTPATIENT_CLINIC_OR_DEPARTMENT_OTHER): Payer: Medicare Other

## 2014-07-21 ENCOUNTER — Telehealth: Payer: Self-pay | Admitting: Oncology

## 2014-07-21 ENCOUNTER — Ambulatory Visit (HOSPITAL_BASED_OUTPATIENT_CLINIC_OR_DEPARTMENT_OTHER): Payer: Medicare Other | Admitting: Oncology

## 2014-07-21 VITALS — BP 155/75 | HR 91 | Temp 97.9°F | Resp 18 | Ht 69.5 in | Wt 200.6 lb

## 2014-07-21 DIAGNOSIS — C9110 Chronic lymphocytic leukemia of B-cell type not having achieved remission: Secondary | ICD-10-CM

## 2014-07-21 LAB — COMPREHENSIVE METABOLIC PANEL (CC13)
ALK PHOS: 78 U/L (ref 40–150)
ALT: 20 U/L (ref 0–55)
AST: 19 U/L (ref 5–34)
Albumin: 4 g/dL (ref 3.5–5.0)
Anion Gap: 9 mEq/L (ref 3–11)
BILIRUBIN TOTAL: 0.87 mg/dL (ref 0.20–1.20)
BUN: 21.3 mg/dL (ref 7.0–26.0)
CO2: 24 mEq/L (ref 22–29)
Calcium: 9.3 mg/dL (ref 8.4–10.4)
Chloride: 109 mEq/L (ref 98–109)
Creatinine: 1.4 mg/dL — ABNORMAL HIGH (ref 0.7–1.3)
Glucose: 201 mg/dl — ABNORMAL HIGH (ref 70–140)
Potassium: 4 mEq/L (ref 3.5–5.1)
SODIUM: 141 meq/L (ref 136–145)
Total Protein: 6.6 g/dL (ref 6.4–8.3)

## 2014-07-21 LAB — CBC WITH DIFFERENTIAL/PLATELET
BASO%: 0.1 % (ref 0.0–2.0)
Basophils Absolute: 0 10*3/uL (ref 0.0–0.1)
EOS ABS: 0.1 10*3/uL (ref 0.0–0.5)
EOS%: 0.8 % (ref 0.0–7.0)
HCT: 45.2 % (ref 38.4–49.9)
HGB: 15.2 g/dL (ref 13.0–17.1)
LYMPH%: 69.4 % — AB (ref 14.0–49.0)
MCH: 28.5 pg (ref 27.2–33.4)
MCHC: 33.6 g/dL (ref 32.0–36.0)
MCV: 84.8 fL (ref 79.3–98.0)
MONO#: 0.5 10*3/uL (ref 0.1–0.9)
MONO%: 3.3 % (ref 0.0–14.0)
NEUT#: 3.8 10*3/uL (ref 1.5–6.5)
NEUT%: 26.4 % — AB (ref 39.0–75.0)
Platelets: 154 10*3/uL (ref 140–400)
RBC: 5.33 10*6/uL (ref 4.20–5.82)
RDW: 13.3 % (ref 11.0–14.6)
WBC: 14.4 10*3/uL — AB (ref 4.0–10.3)
lymph#: 10 10*3/uL — ABNORMAL HIGH (ref 0.9–3.3)

## 2014-07-21 NOTE — Telephone Encounter (Signed)
s.w. pt and advised on NOV 2016 appt....ok and aware

## 2014-07-21 NOTE — Progress Notes (Signed)
[place], MD [place] [address]    DIAGNOSIS: 73 year old gentleman with early stage CLL diagnosed in 2002. He presented with leukocytosis that is asymptomatic.  CURRENT THERAPY: Observation and surveillance.  INTERVAL HISTORY: Samuel Stephens 73 y.o. male returns for a follow up visit.  Since his last visit, he does not report any new complaints. 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 He does not report any lymphadenopathy or constitutional symptoms. 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 He has not reported any fevers, chills or any weight loss. He does not report any chest pain, shortness of breath or difficulty breathing. He does not report any cough or hemoptysis or wheezing. He does not report any nausea, vomiting, abdominal pain or early satiety. He does not report any frequency urgency or hesitancy. Rest of his review of systems unremarkable.  MEDICAL HISTORY: Past Medical History  Diagnosis Date  . Hyperlipidemia   . Pedal edema   . Glucose intolerance (impaired glucose tolerance)   . CKD (chronic kidney disease)   . CLL (chronic lymphoblastic leukemia) 2002  . 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    ALLERGIES:  has No Known Allergies.  MEDICATIONS: Samuel Stephens does not currently have medications on file.  PHYSICAL EXAMINATION:  ECOG PERFORMANCE STATUS: 0 - Asymptomatic  Filed Vitals:   07/21/14 1310  BP: 155/75  Pulse: 91  Temp: 97.9 F (36.6 C)  Resp: 18   GENERAL:alert, healthy and no distress SKIN: skin color, texture, turgor are normal HEAD: Normocephalic EYES: PERRLA OROPHARYNX: no exudate  NECK: no adenopathy LYMPH:  no palpable lymphadenopathy BREAST:not  examined LUNGS: negative findings:  chest symmetric with normal A/P diameter HEART: regular rate & rhythm ABDOMEN:abdomen soft, non-tender, normal bowel sounds and no masses or organomegaly BACK: no skin lesions, erythema or scars EXTREMITIES: 1+ edema.      LABORATORY DATA: CBC    Component Value Date/Time   WBC 14.4* 07/21/2014 1254   WBC 16.1* 01/11/2014 0825   RBC 5.33 07/21/2014 1254   RBC 5.43 01/11/2014 0825   RBC 5.63 04/04/2009 1009   HGB 15.2 07/21/2014 1254   HGB 15.9 01/11/2014 0825   HCT 45.2 07/21/2014 1254   HCT 47.2 01/11/2014 0825   PLT 154 07/21/2014 1254   PLT 190.0 01/11/2014 0825   MCV 84.8 07/21/2014 1254   MCV 86.8 01/11/2014 0825   MCH 28.5 07/21/2014 1254   MCH 28.5 09/16/2012 0545   MCHC 33.6 07/21/2014 1254   MCHC 33.8 01/11/2014 0825   RDW 13.3 07/21/2014 1254   RDW 13.5 01/11/2014 0825   LYMPHSABS 10.0* 07/21/2014 1254   LYMPHSABS 11.6* 01/11/2014 0825   MONOABS 0.5 07/21/2014 1254   MONOABS 0.6 01/11/2014 0825   EOSABS 0.1 07/21/2014 1254   EOSABS 0.3 01/11/2014 0825   BASOSABS 0.0 07/21/2014 1254   BASOSABS 0.0 01/11/2014 0825      ASSESSMENT:   73 year old gentleman with stage 0 CLL without any major changes since his time of diagnosis. diagnosis. His continued to be asymptomatic and the review if his CBC today showed stability over the last few years.  PLAN:  No intervention or treatment is needed. I recommend continued observation and surveillance and repeat physical examination and  laboratory testing in one year.     [facility] MD 07/21/2014

## 2014-12-29 ENCOUNTER — Ambulatory Visit (INDEPENDENT_AMBULATORY_CARE_PROVIDER_SITE_OTHER): Payer: Medicare Other | Admitting: Family Medicine

## 2014-12-29 ENCOUNTER — Encounter: Payer: Self-pay | Admitting: Family Medicine

## 2014-12-29 VITALS — BP 123/70 | HR 86 | Temp 98.3°F | Ht 69.5 in | Wt 200.0 lb

## 2014-12-29 DIAGNOSIS — B349 Viral infection, unspecified: Secondary | ICD-10-CM | POA: Diagnosis not present

## 2014-12-29 NOTE — Progress Notes (Signed)
Pre visit review using our clinic review tool, if applicable. No additional management support is needed unless otherwise documented below in the visit note. 

## 2014-12-29 NOTE — Progress Notes (Signed)
   Subjective:    Patient ID: Samuel Stephens, male    DOB: [birth year], 74 y.o.   MRN: [id number]  HPI Here for 3 days of intermittent low grade fevers with no other symptoms. No sinus pressure or ST or cough. No urinary or bowel problems. Drinking fluids.    Review of Systems  Constitutional: Positive for fever. Negative for chills and diaphoresis.  HENT: Negative.   Eyes: Negative.   Respiratory: Negative.   Gastrointestinal: Negative.   Genitourinary: Negative.        Objective:   Physical Exam  Constitutional: He is oriented to person, place, and time. He appears well-developed and well-nourished.  HENT:  Right Ear: External ear normal.  Left Ear: External ear normal.  Nose: Nose normal.  Mouth/Throat: Oropharynx is clear and moist.  Eyes: Conjunctivae are normal.  Cardiovascular: Normal rate, regular rhythm, normal heart sounds and intact distal pulses.   Pulmonary/Chest: Effort normal and breath sounds normal.  Abdominal: Soft. Bowel sounds are normal. He exhibits no distension and no mass. There is no tenderness. There is no rebound and no guarding.  No HSM  Lymphadenopathy:    He has no cervical adenopathy.  Neurological: He is alert and oriented to person, place, and time.          Assessment & Plan:  Fever or unknown origin, probably viral. Use Tylenol prn. Recheck if anything changes

## 2015-01-17 ENCOUNTER — Other Ambulatory Visit (INDEPENDENT_AMBULATORY_CARE_PROVIDER_SITE_OTHER): Payer: Medicare Other

## 2015-01-17 DIAGNOSIS — N400 Enlarged prostate without lower urinary tract symptoms: Secondary | ICD-10-CM

## 2015-01-17 DIAGNOSIS — Z0000 Encounter for general adult medical examination without abnormal findings: Secondary | ICD-10-CM

## 2015-01-17 DIAGNOSIS — E785 Hyperlipidemia, unspecified: Secondary | ICD-10-CM | POA: Diagnosis not present

## 2015-01-17 LAB — CBC WITH DIFFERENTIAL/PLATELET
BASOS PCT: 0.3 % (ref 0.0–3.0)
Basophils Absolute: 0.1 10*3/uL (ref 0.0–0.1)
EOS PCT: 1.1 % (ref 0.0–5.0)
Eosinophils Absolute: 0.2 10*3/uL (ref 0.0–0.7)
HEMATOCRIT: 46.9 % (ref 39.0–52.0)
Hemoglobin: 15.8 g/dL (ref 13.0–17.0)
LYMPHS ABS: 11.7 10*3/uL — AB (ref 0.7–4.0)
Lymphocytes Relative: 70.3 % — ABNORMAL HIGH (ref 12.0–46.0)
MCHC: 33.6 g/dL (ref 30.0–36.0)
MCV: 84.9 fl (ref 78.0–100.0)
MONO ABS: 0.7 10*3/uL (ref 0.1–1.0)
Monocytes Relative: 4.2 % (ref 3.0–12.0)
NEUTROS ABS: 4 10*3/uL (ref 1.4–7.7)
NEUTROS PCT: 24.1 % — AB (ref 43.0–77.0)
Platelets: 211 10*3/uL (ref 150.0–400.0)
RBC: 5.52 Mil/uL (ref 4.22–5.81)
RDW: 14.5 % (ref 11.5–15.5)
WBC: 16.6 10*3/uL — ABNORMAL HIGH (ref 4.0–10.5)

## 2015-01-17 LAB — HEPATIC FUNCTION PANEL
ALK PHOS: 73 U/L (ref 39–117)
ALT: 20 U/L (ref 0–53)
AST: 20 U/L (ref 0–37)
Albumin: 4.1 g/dL (ref 3.5–5.2)
Bilirubin, Direct: 0.2 mg/dL (ref 0.0–0.3)
TOTAL PROTEIN: 6.6 g/dL (ref 6.0–8.3)
Total Bilirubin: 1 mg/dL (ref 0.2–1.2)

## 2015-01-17 LAB — POCT URINALYSIS DIPSTICK
Bilirubin, UA: NEGATIVE
Glucose, UA: NEGATIVE
Ketones, UA: NEGATIVE
Leukocytes, UA: NEGATIVE
Nitrite, UA: NEGATIVE
PH UA: 6
PROTEIN UA: NEGATIVE
RBC UA: NEGATIVE
Spec Grav, UA: 1.02
Urobilinogen, UA: 0.2

## 2015-01-17 LAB — BASIC METABOLIC PANEL
BUN: 24 mg/dL — ABNORMAL HIGH (ref 6–23)
CO2: 28 meq/L (ref 19–32)
Calcium: 9.4 mg/dL (ref 8.4–10.5)
Chloride: 104 mEq/L (ref 96–112)
Creatinine, Ser: 1.37 mg/dL (ref 0.40–1.50)
GFR: 54.12 mL/min — AB (ref >60.00)
Glucose, Bld: 117 mg/dL — ABNORMAL HIGH (ref 70–99)
Potassium: 4.5 mEq/L (ref 3.5–5.1)
SODIUM: 137 meq/L (ref 135–145)

## 2015-01-17 LAB — LIPID PANEL
Cholesterol: 173 mg/dL (ref 0–200)
HDL: 50.6 mg/dL (ref >39.00)
LDL CALC: 103 mg/dL — AB (ref 0–99)
NONHDL: 122.4
Total CHOL/HDL Ratio: 3
Triglycerides: 97 mg/dL (ref 0.0–149.0)
VLDL: 19.4 mg/dL (ref 0.0–40.0)

## 2015-01-17 LAB — TSH: TSH: 2.29 u[IU]/mL (ref 0.35–4.50)

## 2015-01-17 LAB — PSA: PSA: 2.55 ng/mL (ref 0.10–4.00)

## 2015-01-24 ENCOUNTER — Ambulatory Visit (INDEPENDENT_AMBULATORY_CARE_PROVIDER_SITE_OTHER): Payer: Medicare Other | Admitting: Internal Medicine

## 2015-01-24 ENCOUNTER — Encounter: Payer: Self-pay | Admitting: Internal Medicine

## 2015-01-24 VITALS — BP 130/80 | HR 78 | Temp 97.9°F | Resp 20 | Ht 68.75 in | Wt 202.0 lb

## 2015-01-24 DIAGNOSIS — Z0000 Encounter for general adult medical examination without abnormal findings: Secondary | ICD-10-CM | POA: Diagnosis not present

## 2015-01-24 DIAGNOSIS — C9110 Chronic lymphocytic leukemia of B-cell type not having achieved remission: Secondary | ICD-10-CM | POA: Diagnosis not present

## 2015-01-24 DIAGNOSIS — E785 Hyperlipidemia, unspecified: Secondary | ICD-10-CM

## 2015-01-24 DIAGNOSIS — Z860100 Personal history of colon polyps, unspecified: Secondary | ICD-10-CM

## 2015-01-24 NOTE — Progress Notes (Signed)
Patient ID: Samuel Stephens, male   DOB: [birth year], 74 y.o.   MRN: [id number]  Subjective:    Patient ID: Samuel Stephens, male    DOB: 08/10/1941, 74 y.o.   MRN: [id number]  HPI 74  year-old patient who is seen today for a wellness exam.  He is followed by hematology annually for stable CLL. He has a history of mild dyslipidemia BPH and a history of colonic polyps. He had followup colonoscopy in February of 2012 and 5/15.  He feels quite well today no concerns or complaints. He still teaches and remains quite active. He denies any cardiopulmonary complaints.  He was hospitalized briefly in January of 2014 for a stroke resulted in left lower extremity weakness. He had a full neurological evaluation and has been evaluated by neurology.  He remains on Plavix for secondary prevention  Social history works part-time Research scientist (medical) at [employer]  Diagnosis Date  . Hyperlipidemia   . Pedal edema   . Glucose intolerance (impaired glucose tolerance)   . CKD (chronic kidney disease)   . CLL (chronic lymphoblastic leukemia) 2002  . 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 Marital Status: Married    Spouse Name: N/A  . Number of Children: 3  . Years of Education: masters   Occupational History  . retired    Social History Main Topics  . Smoking status: Never Smoker   . Smokeless tobacco: Never Used  . Alcohol Use: 0.0 oz/week    0 Standard drinks or equivalent per week     Comment: occassional/ a beer with every meal  . Drug Use: No  . Sexual Activity: Yes   Other Topics Concern  . Not on file   Social History Narrative    Past Surgical History  Procedure Laterality Date  . Tee without cardioversion  09/16/2012    Procedure: TRANSESOPHAGEAL ECHOCARDIOGRAM (TEE);  Surgeon: Larey Dresser, MD;  Location: [facility] END[facility]COPY;  Service: Cardiovascular;  Laterality: N/A;  . Colonoscopy w/ biopsies      multiple     Family History  Problem Relation Age of Onset  . Stroke Mother   . Uterine cancer Mother   . Thyroid cancer Mother   . Stroke Father   . Diabetes Father   . Colon cancer Neg Hx   . Stomach cancer Neg Hx     No Known Allergies  Current Outpatient Prescriptions on File Prior to Visit  Medication Sig Dispense Refill  . clopidogrel (PLAVIX) 75 MG tablet TAKE 1 TABLET DAILY WITH BREAKFAST 90 tablet 3  . furosemide (LASIX) 40 MG tablet TAKE 1 TABLET (40 MG TOTAL) BY MOUTH DAILY AS NEEDED. FOR FLUID RETENTION 60 tablet 1  . Omega-3 Fatty Acids (FISH OIL) 1200 MG CAPS Take 1,200 mg by mouth every evening.    . simvastatin (ZOCOR) 40 MG tablet TAKE 1 TABLET (40 MG TOTAL) BY MOUTH AT BEDTIME. 90 tablet 3   No current facility-administered medications on file prior to visit.    BP 130/80 mmHg  Pulse 78  Temp(Src) 97.9 F (36.6 C) (Oral)  Resp 20  Ht 5' 8.75" (1.746 m)  Wt 202 lb (91.627 kg)  BMI 30.06 kg/m2  SpO2 97%  Allergies (verified):  No Known Drug Allergies   Past History:  Past Medical History:  Hyperlipidemia  CLL: dx '02  Benign prostatic hypertrophy-mild  pedal edema  impaired glucose tolerance  CKD  Colonic polyps, hx of   Past Surgical History:   none  colonoscopy 2008 2015  Family History:   F deceased at 63: CVA,tobacco, DM  M deceased at 99: CVA, uterine and thyroid ca  4 B & 2 S: + hypercholesterolemia; one sister died complications of Rheumatic valvular disease  Family History Diabetes 1st degree relative  Family History of Stroke F 1st degree relative <60  Family History Thyroid disease  Family History Uterine cancer   Social History:   Research scientist (medical) at [employer]; 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 Risk factors, based on past  M,S,F history-  cardiovascular as factors include dyslipidemia and a history of cerebrovascular disease  2.  Physical activities: Remains active no exercise limitations  3.   Depression/mood: No history depression or mood disorder  4.  Hearing: No deficits  5.  ADL's: Independent in all aspects of daily living  6.  Fall risk: Low  7.  Home safety: No problems identified  8.  Height weight, and visual acuity; height and weight stable no change in visual acuity  9.  Counseling: Nonapplicable  10. Lab orders based on risk factors: Recent lab performed in the hospital in January  11. Referral : Followup neurology and hematology  12. Care plan: Continue low-salt diet regular exercise regimen prune eating habits  13. Cognitive assessment: Alert and oriented with normal affect. No cognitive dysfunction  14.  Preventive services will include colonoscopies at five-year intervals.  He will have annual preventive care.  Examinations with screening lab.  He has a history of impaired glucose tolerance and blood sugars will be checked periodically.  He will be seen at least annually by hematology due to his stable CLL.  Annual eye examinations recommended  15.  Provider list update includes ophthalmology hematology GI primary care medicine.      Review of Systems  Constitutional: Negative for fever, chills, activity change, appetite change and fatigue.  HENT: Negative for congestion, dental problem, ear pain, hearing loss, mouth sores, rhinorrhea, sinus pressure, sneezing, tinnitus, trouble swallowing and voice change.   Eyes: Negative for photophobia, pain, redness and visual disturbance.  Respiratory: Negative for apnea, cough, choking, chest tightness, shortness of breath and wheezing.   Cardiovascular: Negative for chest pain, palpitations and leg swelling.  Gastrointestinal: Negative for nausea, vomiting, abdominal pain, diarrhea, constipation, blood in stool, abdominal distention, anal bleeding and rectal pain.  Genitourinary: Negative for dysuria, urgency, frequency, hematuria, flank pain, decreased urine volume, discharge, penile swelling, scrotal swelling,  difficulty urinating, genital sores and testicular pain.  Musculoskeletal: Negative for myalgias, back pain, joint swelling, arthralgias, gait problem, neck pain and neck stiffness.  Skin: Negative for color change, rash and wound.  Neurological: Negative for dizziness, tremors, seizures, syncope, facial asymmetry, speech difficulty, weakness, light-headedness, numbness and headaches.  Hematological: Negative for adenopathy. Does not bruise/bleed easily.  Psychiatric/Behavioral: Negative for suicidal ideas, hallucinations, behavioral problems, confusion, sleep disturbance, self-injury, dysphoric mood, decreased concentration and agitation. The patient is not nervous/anxious.        Objective:   Physical Exam  Constitutional: He appears well-developed and well-nourished.  HENT:  Head: Normocephalic and atraumatic.  Right Ear: External ear normal.  Left Ear: External ear normal.  Nose: Nose normal.  Mouth/Throat: Oropharynx is clear and moist.  Eyes: Conjunctivae and EOM are normal. Pupils are equal, round, and reactive to light. No scleral icterus.  Neck: Normal range of motion. Neck supple.  No JVD present. No thyromegaly present.  Cardiovascular: Regular rhythm, normal heart sounds and intact distal pulses.  Exam reveals no gallop and no friction rub.   No murmur heard. Decreased left dorsalis pedis pulse  Pulmonary/Chest: Effort normal and breath sounds normal. He exhibits no tenderness.  Abdominal: Soft. Bowel sounds are normal. He exhibits no distension and no mass. There is no tenderness.  Genitourinary: Penis normal.  Musculoskeletal: Normal range of motion. He exhibits edema. He exhibits no tenderness.  Plus 1 peripheral edema  Lymphadenopathy:    He has no cervical adenopathy.  Neurological: He is alert. He has normal reflexes. No cranial nerve deficit. Coordination normal.  Skin: Skin is warm and dry. No rash noted.  Psychiatric: He has a normal mood and affect. His behavior  is normal.          Assessment & Plan:   Preventive health examination CLL stable BPH mild and largely asymptomatic History of colonic polyps

## 2015-01-24 NOTE — Patient Instructions (Addendum)
Limit your sodium (Salt) intake    It is important that you exercise regularly, at least 20 minutes 3 to 4 times per week.  If you develop chest pain or shortness of breath seek  medical attention.  Return in one year for follow-up  Continue annual hematology follow-up Continue colonoscopies at five-year intervals Annual eye examinations   Preventive services should include colonoscopies at five-year intervals.   annual preventive care  Examinations with screening lab.   blood sugars will be checked periodically.  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  Annual eye examinations recommended  Health Maintenance A healthy lifestyle and preventative care can promote health and wellness.  Maintain regular health, dental, and eye exams.  Eat a healthy diet. Foods like vegetables, fruits, whole grains, low-fat dairy products, and lean protein foods contain the nutrients you need and are low in calories. Decrease your intake of foods high in solid fats, added sugars, and salt. Get information about a proper diet from your health care provider, if necessary.  Regular physical exercise is one of the most important things you can do for your health. Most adults should get at least 150 minutes of moderate-intensity exercise (any activity that increases your heart rate and causes you to sweat) each week. In addition, most adults need muscle-strengthening exercises on 2 or more days a week.   Maintain a healthy weight. The body mass index (BMI) is a screening tool to identify possible weight problems. It provides an estimate of body fat based on height and weight. Your health care provider can find your BMI and can help you achieve or maintain a healthy weight. For males 20 years and older:  A BMI below 18.5 is considered underweight.  A BMI of 18.5 to 24.9 is normal.  A BMI of 25 to 29.9 is considered overweight.  A BMI of 30 and above is considered obese.  Maintain normal blood lipids  and cholesterol by exercising and minimizing your intake of saturated fat. Eat a balanced diet with plenty of fruits and vegetables. Blood tests for lipids and cholesterol should begin at age 24 and be repeated every 5 years. If your lipid or cholesterol levels are high, you are over age 14, or you are at high risk for heart disease, you may need your cholesterol levels checked more frequently.Ongoing high lipid and cholesterol levels should be treated with medicines if diet and exercise are not working.  If you smoke, find out from your health care provider how to quit. If you do not use tobacco, do not start.  Lung cancer screening is recommended for adults aged 12-80 years who are at high risk for developing lung cancer because of a history of smoking. A yearly low-dose CT scan of the lungs is recommended for people who have at least a 30-pack-year history of smoking and are current smokers or have quit within the past 15 years. A pack year of smoking is smoking an average of 1 pack of cigarettes a day for 1 year (for example, a 30-pack-year history of smoking could mean smoking 1 pack a day for 30 years or 2 packs a day for 15 years). Yearly screening should continue until the smoker has stopped smoking for at least 15 years. Yearly screening should be stopped for people who develop a health problem that would prevent them from having lung cancer treatment.  If you choose to drink alcohol, do not have more than 2 drinks per day. One drink  is considered to be 12 oz (360 mL) of beer, 5 oz (150 mL) of wine, or 1.5 oz (45 mL) of liquor.  Avoid the use of street drugs. Do not share needles with anyone. Ask for help if you need support or instructions about stopping the use of drugs.  High blood pressure causes heart disease and increases the risk of stroke. Blood pressure should be checked at least every 1-2 years. Ongoing high blood pressure should be treated with medicines if weight loss and exercise are  not effective.  If you are 33-20 years old, ask your health care provider if you should take aspirin to prevent heart disease.  Diabetes screening involves taking a blood sample to check your fasting blood sugar level. This should be done once every 3 years after age 35 if you are at a normal weight and without risk factors for diabetes. Testing should be considered at a younger age or be carried out more frequently if you are overweight and have at least 1 risk factor for diabetes.  Colorectal cancer can be detected and often prevented. Most routine colorectal cancer screening begins at the age of 46 and continues through age 69. However, your health care provider may recommend screening at an earlier age if you have risk factors for colon cancer. On a yearly basis, your health care provider may provide home test kits to check for hidden blood in the stool. A small camera at the end of a tube may be used to directly examine the colon (sigmoidoscopy or colonoscopy) to detect the earliest forms of colorectal cancer. Talk to your health care provider about this at age 42 when routine screening begins. A direct exam of the colon should be repeated every 5-10 years through age 9, unless early forms of precancerous polyps or small growths are found.  People who are at an increased risk for hepatitis B should be screened for this virus. You are considered at high risk for hepatitis B if:  You were born in a country where hepatitis B occurs often. Talk with your health care provider about which countries are considered high risk.  Your parents were born in a high-risk country and you have not received a shot to protect against hepatitis B (hepatitis B vaccine).  You have HIV or AIDS.  You use needles to inject street drugs.  You live with, or have sex with, someone who has hepatitis B.  You are a man who has sex with other men (MSM).  You get hemodialysis treatment.  You take certain medicines for  conditions like cancer, organ transplantation, and autoimmune conditions.  Hepatitis C blood testing is recommended for all people born from [birth year] through 1964 and any individual with known risk factors for hepatitis C.  Healthy men should no longer receive prostate-specific antigen (PSA) blood tests as part of routine cancer screening. Talk to your health care provider about prostate cancer screening.  Testicular cancer screening is not recommended for adolescents or adult males who have no symptoms. Screening includes self-exam, a health care provider exam, and other screening tests. Consult with your health care provider about any symptoms you have or any concerns you have about testicular cancer.  Practice safe sex. Use condoms and avoid high-risk sexual practices to reduce the spread of sexually transmitted infections (STIs).  You should be screened for STIs, including gonorrhea and chlamydia if:  You are sexually active and are younger than 24 years.  You are older than 24 years, and  your health care provider tells you that you are at risk for this type of infection.  Your sexual activity has changed since you were last screened, and you are at an increased risk for chlamydia or gonorrhea. Ask your health care provider if you are at risk.  If you are at risk of being infected with HIV, it is recommended that you take a prescription medicine daily to prevent HIV infection. This is called pre-exposure prophylaxis (PrEP). You are considered at risk if:  You are a man who has sex with other men (MSM).  You are a heterosexual man who is sexually active with multiple partners.  You take drugs by injection.  You are sexually active with a partner who has HIV.  Talk with your health care provider about whether you are at high risk of being infected with HIV. If you choose to begin PrEP, you should first be tested for HIV. You should then be tested every 3 months for as long as you are taking  PrEP.  Use sunscreen. Apply sunscreen liberally and repeatedly throughout the day. You should seek shade when your shadow is shorter than you. Protect yourself by wearing long sleeves, pants, a wide-brimmed hat, and sunglasses year round whenever you are outdoors.  Tell your health care provider of new moles or changes in moles, especially if there is a change in shape or color. Also, tell your health care provider if a mole is larger than the size of a pencil eraser.  A one-time screening for abdominal aortic aneurysm (AAA) and surgical repair of large AAAs by ultrasound is recommended for men aged 24-75 years who are current or former smokers.  Stay current with your vaccines (immunizations). Document Released: 02/14/2008 Document Revised: 08/23/2013 Document Reviewed: 01/13/2011 [facility] Patient Information 2015 [place], [place]. This information is not intended to replace advice given to you by your health care provider. Make sure you discuss any questions you have with your health care provider.

## 2015-01-24 NOTE — Progress Notes (Signed)
Pre visit review using our clinic review tool, if applicable. No additional management support is needed unless otherwise documented below in the visit note. 

## 2015-03-23 ENCOUNTER — Other Ambulatory Visit: Payer: Self-pay | Admitting: Internal Medicine

## 2015-04-24 ENCOUNTER — Encounter: Payer: Self-pay | Admitting: Internal Medicine

## 2015-05-17 ENCOUNTER — Other Ambulatory Visit: Payer: Self-pay | Admitting: Internal Medicine

## 2015-06-26 DIAGNOSIS — H60333 Swimmer's ear, bilateral: Secondary | ICD-10-CM | POA: Diagnosis not present

## 2015-06-26 DIAGNOSIS — H7203 Central perforation of tympanic membrane, bilateral: Secondary | ICD-10-CM | POA: Diagnosis not present

## 2015-06-26 DIAGNOSIS — H6123 Impacted cerumen, bilateral: Secondary | ICD-10-CM | POA: Diagnosis not present

## 2015-06-26 DIAGNOSIS — R490 Dysphonia: Secondary | ICD-10-CM | POA: Diagnosis not present

## 2015-07-20 ENCOUNTER — Telehealth: Payer: Self-pay | Admitting: Oncology

## 2015-07-20 ENCOUNTER — Ambulatory Visit (HOSPITAL_BASED_OUTPATIENT_CLINIC_OR_DEPARTMENT_OTHER): Payer: Medicare Other | Admitting: Oncology

## 2015-07-20 ENCOUNTER — Other Ambulatory Visit (HOSPITAL_BASED_OUTPATIENT_CLINIC_OR_DEPARTMENT_OTHER): Payer: Medicare Other

## 2015-07-20 VITALS — BP 154/78 | HR 71 | Temp 98.1°F | Resp 18 | Ht 68.75 in | Wt 194.8 lb

## 2015-07-20 DIAGNOSIS — C9110 Chronic lymphocytic leukemia of B-cell type not having achieved remission: Secondary | ICD-10-CM | POA: Diagnosis not present

## 2015-07-20 LAB — CBC WITH DIFFERENTIAL/PLATELET
BASO%: 0.4 % (ref 0.0–2.0)
Basophils Absolute: 0 10*3/uL (ref 0.0–0.1)
EOS%: 1.1 % (ref 0.0–7.0)
Eosinophils Absolute: 0.1 10*3/uL (ref 0.0–0.5)
HCT: 45.2 % (ref 38.4–49.9)
HEMOGLOBIN: 14.8 g/dL (ref 13.0–17.1)
LYMPH#: 7.7 10*3/uL — AB (ref 0.9–3.3)
LYMPH%: 66.7 % — AB (ref 14.0–49.0)
MCH: 28.6 pg (ref 27.2–33.4)
MCHC: 32.7 g/dL (ref 32.0–36.0)
MCV: 87.7 fL (ref 79.3–98.0)
MONO#: 0.5 10*3/uL (ref 0.1–0.9)
MONO%: 4.4 % (ref 0.0–14.0)
NEUT%: 27.4 % — ABNORMAL LOW (ref 39.0–75.0)
NEUTROS ABS: 3.2 10*3/uL (ref 1.5–6.5)
PLATELETS: 158 10*3/uL (ref 140–400)
RBC: 5.15 10*6/uL (ref 4.20–5.82)
RDW: 14.2 % (ref 11.0–14.6)
WBC: 11.6 10*3/uL — ABNORMAL HIGH (ref 4.0–10.3)

## 2015-07-20 LAB — COMPREHENSIVE METABOLIC PANEL (CC13)
ALBUMIN: 3.8 g/dL (ref 3.5–5.0)
ALK PHOS: 69 U/L (ref 40–150)
ALT: 17 U/L (ref 0–55)
ANION GAP: 5 meq/L (ref 3–11)
AST: 21 U/L (ref 5–34)
BILIRUBIN TOTAL: 0.98 mg/dL (ref 0.20–1.20)
BUN: 16 mg/dL (ref 7.0–26.0)
CO2: 26 meq/L (ref 22–29)
CREATININE: 1.2 mg/dL (ref 0.7–1.3)
Calcium: 9 mg/dL (ref 8.4–10.4)
Chloride: 107 mEq/L (ref 98–109)
EGFR: 58 mL/min/{1.73_m2} — ABNORMAL LOW (ref >90)
GLUCOSE: 100 mg/dL (ref 70–140)
Potassium: 4.2 mEq/L (ref 3.5–5.1)
SODIUM: 138 meq/L (ref 136–145)
TOTAL PROTEIN: 6.4 g/dL (ref 6.4–8.3)

## 2015-07-20 LAB — TECHNOLOGIST REVIEW

## 2015-07-20 NOTE — Telephone Encounter (Signed)
Gave patient avs report and appointments for November 2017.  °

## 2015-07-20 NOTE — Progress Notes (Signed)
[place] OFFICE PROGRESS NOTE 07/20/2015  Nyoka Cowden, MD [place] [address]    DIAGNOSIS: 74 year old gentleman with early stage CLL diagnosed in 2002. He presented with leukocytosis without lymphadenopathy at stage 0. He continues to be asymptomatic.  CURRENT THERAPY: Observation and surveillance.  INTERVAL HISTORY: Mr. Loup returns for a follow up visit.  Since his last visit, he continues to do very well. 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 He continues to have lower extremity edema which have not changed dramatically.   He does not report any lymphadenopathy or constitutional symptoms. He does not have any fevers or chills or sweats. His appetite and performance status remains excellent.  He does not report any headaches, blurry vision, syncope or seizures. He does report residual neurological deficits from his stroke in 2014. He does not report any chest pain, shortness of breath or difficulty breathing. He does not report any cough or hemoptysis or wheezing. He does not report any nausea, vomiting, abdominal pain or early satiety. He does not report any frequency urgency or hesitancy. Rest of his review of systems unremarkable.  MEDICAL HISTORY: Past Medical History  Diagnosis Date  . Hyperlipidemia   . Pedal edema   . Glucose intolerance (impaired glucose tolerance)   . CKD (chronic kidney disease)   . CLL (chronic lymphoblastic leukemia) 2002  . 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    ALLERGIES:  has No Known Allergies.  MEDICATIONS: Mr. Spindel does not currently have medications on file.  PHYSICAL EXAMINATION:  ECOG PERFORMANCE STATUS: 0 - Asymptomatic  Filed Vitals:   07/20/15 1309  BP: 154/78  Pulse: 71  Temp: 98.1 F (36.7 C)  Resp: 18   GENERAL:alert,  awake gentleman without distress. SKIN: skin color, texture, turgor are normal OROPHARYNX: no exudate or oral thrush. NECK: no adenopathy or thyromegaly. LYMPH:  no palpable lymphadenopathy BREAST:not examined LUNGS: Clear to auscultation without rhonchi or wheezes or dullness to percussion. HEART: regular rate & rhythm ABDOMEN:abdomen soft, non-tender, normal bowel sounds and no masses or organomegaly during dullness or ascites. BACK: no skin lesions, erythema or scars EXTREMITIES: 1+ edema.      LABORATORY DATA: CBC    Component Value Date/Time   WBC 11.6* 07/20/2015 1243   WBC 16.6* 01/17/2015 0807   RBC 5.15 07/20/2015 1243   RBC 5.52 01/17/2015 0807   RBC 5.63 04/04/2009 1009   HGB 14.8 07/20/2015 1243   HGB 15.8 01/17/2015 0807   HCT 45.2 07/20/2015 1243   HCT 46.9 01/17/2015 0807   PLT 158 07/20/2015 1243   PLT 211.0 01/17/2015 0807   MCV 87.7 07/20/2015 1243   MCV 84.9 01/17/2015 0807   MCH 28.6 07/20/2015 1243   MCH 28.5 09/16/2012 0545   MCHC 32.7 07/20/2015 1243   MCHC 33.6 01/17/2015 0807   RDW 14.2 07/20/2015 1243   RDW 14.5 01/17/2015 0807   LYMPHSABS 7.7* 07/20/2015 1243   LYMPHSABS 11.7* 01/17/2015 0807   MONOABS 0.5 07/20/2015 1243   MONOABS 0.7 01/17/2015 0807   EOSABS 0.1 07/20/2015 1243   EOSABS 0.2 01/17/2015 0807   BASOSABS 0.0 07/20/2015 1243   BASOSABS 0.1 01/17/2015 0807      ASSESSMENT AND PLAN:  74 year old gentleman with:  1. Stage 0 CLL without any major changes since his time of diagnosis. His laboratory testing  and physical examination does not reveal any progression of disease. We'll plan is to continue monitor him with laboratory testing and physical examination on an annual basis. The natural course of this disease was reviewed again as well as the indication for treatment. This would include progressive lymphadenopathy, rapid increase in his counts, cytopenias, or constitutional symptoms. He knows to let me know about these  complaints in the future if they occur between visits.  2. Lower extremity edema: He uses Lasix intermittently.  3. Follow-up: Will be in one year sooner if needed to.   [facility] MD 07/20/2015

## 2015-07-30 DIAGNOSIS — J310 Chronic rhinitis: Secondary | ICD-10-CM | POA: Diagnosis not present

## 2015-07-30 DIAGNOSIS — H7202 Central perforation of tympanic membrane, left ear: Secondary | ICD-10-CM | POA: Diagnosis not present

## 2015-09-14 ENCOUNTER — Other Ambulatory Visit: Payer: Self-pay | Admitting: Internal Medicine

## 2016-01-24 ENCOUNTER — Other Ambulatory Visit: Payer: Medicare Other

## 2016-01-26 ENCOUNTER — Other Ambulatory Visit: Payer: Self-pay | Admitting: Internal Medicine

## 2016-01-30 ENCOUNTER — Other Ambulatory Visit (INDEPENDENT_AMBULATORY_CARE_PROVIDER_SITE_OTHER): Payer: Medicare Other

## 2016-01-30 DIAGNOSIS — Z0000 Encounter for general adult medical examination without abnormal findings: Secondary | ICD-10-CM

## 2016-01-30 LAB — BASIC METABOLIC PANEL
BUN: 19 mg/dL (ref 6–23)
CALCIUM: 8.9 mg/dL (ref 8.4–10.5)
CHLORIDE: 106 meq/L (ref 96–112)
CO2: 27 meq/L (ref 19–32)
Creatinine, Ser: 1.23 mg/dL (ref 0.40–1.50)
GFR: 61.11 mL/min (ref >60.00)
Glucose, Bld: 98 mg/dL (ref 70–99)
POTASSIUM: 4.4 meq/L (ref 3.5–5.1)
SODIUM: 136 meq/L (ref 135–145)

## 2016-01-30 LAB — POC URINALSYSI DIPSTICK (AUTOMATED)
Bilirubin, UA: NEGATIVE
Glucose, UA: NEGATIVE
Ketones, UA: NEGATIVE
Leukocytes, UA: NEGATIVE
NITRITE UA: NEGATIVE
PROTEIN UA: NEGATIVE
RBC UA: NEGATIVE
SPEC GRAV UA: 1.02
UROBILINOGEN UA: 0.2
pH, UA: 5.5

## 2016-01-30 LAB — LIPID PANEL
CHOLESTEROL: 147 mg/dL (ref 0–200)
HDL: 40.9 mg/dL (ref >39.00)
LDL Cholesterol: 90 mg/dL (ref 0–99)
NonHDL: 105.81
Total CHOL/HDL Ratio: 4
Triglycerides: 77 mg/dL (ref 0.0–149.0)
VLDL: 15.4 mg/dL (ref 0.0–40.0)

## 2016-01-30 LAB — HEPATIC FUNCTION PANEL
ALT: 18 U/L (ref 0–53)
AST: 19 U/L (ref 0–37)
Albumin: 4.1 g/dL (ref 3.5–5.2)
Alkaline Phosphatase: 57 U/L (ref 39–117)
BILIRUBIN DIRECT: 0.2 mg/dL (ref 0.0–0.3)
TOTAL PROTEIN: 6.1 g/dL (ref 6.0–8.3)
Total Bilirubin: 0.9 mg/dL (ref 0.2–1.2)

## 2016-01-30 LAB — CBC WITH DIFFERENTIAL/PLATELET
BASOS ABS: 0.1 10*3/uL (ref 0.0–0.1)
Basophils Relative: 0.4 % (ref 0.0–3.0)
EOS PCT: 1.5 % (ref 0.0–5.0)
Eosinophils Absolute: 0.2 10*3/uL (ref 0.0–0.7)
HEMATOCRIT: 45.6 % (ref 39.0–52.0)
Hemoglobin: 15.2 g/dL (ref 13.0–17.0)
LYMPHS PCT: 71.6 % — AB (ref 12.0–46.0)
Lymphs Abs: 11 10*3/uL — ABNORMAL HIGH (ref 0.7–4.0)
MCHC: 33.4 g/dL (ref 30.0–36.0)
MCV: 86.6 fl (ref 78.0–100.0)
MONOS PCT: 4.4 % (ref 3.0–12.0)
Monocytes Absolute: 0.7 10*3/uL (ref 0.1–1.0)
NEUTROS ABS: 3.4 10*3/uL (ref 1.4–7.7)
NEUTROS PCT: 22.1 % — AB (ref 43.0–77.0)
PLATELETS: 165 10*3/uL (ref 150.0–400.0)
RBC: 5.26 Mil/uL (ref 4.22–5.81)
RDW: 13.5 % (ref 11.5–15.5)
WBC: 15.4 10*3/uL — ABNORMAL HIGH (ref 4.0–10.5)

## 2016-01-30 LAB — TSH: TSH: 2.4 u[IU]/mL (ref 0.35–4.50)

## 2016-01-31 ENCOUNTER — Encounter: Payer: Medicare Other | Admitting: Internal Medicine

## 2016-02-01 ENCOUNTER — Encounter: Payer: Self-pay | Admitting: Internal Medicine

## 2016-02-01 ENCOUNTER — Ambulatory Visit (INDEPENDENT_AMBULATORY_CARE_PROVIDER_SITE_OTHER): Payer: Medicare Other | Admitting: Internal Medicine

## 2016-02-01 VITALS — BP 130/80 | HR 80 | Temp 98.0°F | Resp 18 | Ht 68.0 in | Wt 198.0 lb

## 2016-02-01 DIAGNOSIS — R7302 Impaired glucose tolerance (oral): Secondary | ICD-10-CM

## 2016-02-01 DIAGNOSIS — Z86010 Personal history of colonic polyps: Secondary | ICD-10-CM

## 2016-02-01 DIAGNOSIS — Z0000 Encounter for general adult medical examination without abnormal findings: Secondary | ICD-10-CM

## 2016-02-01 DIAGNOSIS — C9110 Chronic lymphocytic leukemia of B-cell type not having achieved remission: Secondary | ICD-10-CM

## 2016-02-01 DIAGNOSIS — E785 Hyperlipidemia, unspecified: Secondary | ICD-10-CM | POA: Diagnosis not present

## 2016-02-01 NOTE — Patient Instructions (Signed)
Limit your sodium (Salt) intake  Please check your blood pressure on a regular basis.  If it is consistently greater than 150/90, please make an office appointment.    It is important that you exercise regularly, at least 20 minutes 3 to 4 times per week.  If you develop chest pain or shortness of breath seek  medical attention.  Return in one year for follow-up  

## 2016-02-01 NOTE — Progress Notes (Signed)
Patient ID: Stephens HANSER, male   DOB: 1940/10/30, 75 y.o.   MRN: [id number]  Subjective:    Patient ID: Samuel Stephens, male    DOB: [birth year], 12 y.o.   MRN: [id number]  HPI 75   year-old patient who is seen today for a wellness exam.  He is followed by hematology annually for stable CLL. He has a history of mild dyslipidemia BPH and a history of colonic polyps. He had followup colonoscopy in February of 2012 and 5/15.  He feels quite well today no concerns or complaints. He has recently retired from teaching and remains quite active. He denies any cardiopulmonary complaints.  He was hospitalized briefly in January of 2014 for a stroke resulted in left lower extremity weakness. He had a full neurological evaluation and has been evaluated by neurology.  He remains on Plavix for secondary prevention  Social history he has recently retired from Research scientist (medical) at [employer]   Past Medical History  Diagnosis Date  . Hyperlipidemia   . Pedal edema   . Glucose intolerance (impaired glucose tolerance)   . CKD (chronic kidney disease)   . CLL (chronic lymphoblastic leukemia) 2002  . COLONIC POLYPS, HX OF- Adenomas 03/26/2007          Social History   Social History  . Marital Status: Married    Spouse Name: N/A  . Number of Children: 3  . Years of Education: masters   Occupational History  . retired    Social History Main Topics  . Smoking status: Never Smoker   . Smokeless tobacco: Never Used  . Alcohol Use: 0.0 oz/week    0 Standard drinks or equivalent per week     Comment: occassional/ a beer with every meal  . Drug Use: No  . Sexual Activity: Yes   Other Topics Concern  . Not on file   Social History Narrative    Past Surgical History  Procedure Laterality Date  . Tee without cardioversion  09/16/2012    Procedure: TRANSESOPHAGEAL ECHOCARDIOGRAM (TEE);  Surgeon: Larey Dresser, MD;  Location: [facility] ENDOSCOPY;  Service: Cardiovascular;  Laterality: N/A;  .  Colonoscopy w/ biopsies      multiple    Family History  Problem Relation Age of Onset  . Stroke Mother   . Uterine cancer Mother   . Thyroid cancer Mother   . Stroke Father   . Diabetes Father   . Colon cancer Neg Hx   . Stomach cancer Neg Hx     No Known Allergies  Current Outpatient Prescriptions on File Prior to Visit  Medication Sig Dispense Refill  . clopidogrel (PLAVIX) 75 MG tablet TAKE 1 TABLET BY MOUTH EVERY DAY WITH BREAKFAST 90 tablet 1  . furosemide (LASIX) 40 MG tablet TAKE 1 TABLET (40 MG TOTAL) BY MOUTH DAILY AS NEEDED. FOR FLUID RETENTION 60 tablet 2  . Omega-3 Fatty Acids (FISH OIL) 1200 MG CAPS Take 1,200 mg by mouth every evening.    . simvastatin (ZOCOR) 40 MG tablet TAKE 1 TABLET (40 MG TOTAL) BY MOUTH AT BEDTIME. 90 tablet 3   No current facility-administered medications on file prior to visit.    BP 130/80 mmHg  Pulse 80  Temp(Src) 98 F (36.7 C) (Oral)  Resp 18  Ht 5\' 8"  (1.727 m)  Wt 198 lb (89.812 kg)  BMI 30.11 kg/m2  SpO2 97%  Allergies (verified):  No Known Drug Allergies   Past History:  Past Medical History:  Hyperlipidemia  CLL: dx '02  Benign prostatic hypertrophy-mild  pedal edema  impaired glucose tolerance  CKD  Colonic polyps, hx of   Past Surgical History:   none  colonoscopy 2008 2015  Family History:   F deceased at 19: CVA,tobacco, DM  M deceased at 20: CVA, uterine and thyroid ca  4 B & 2 S: + hypercholesterolemia; one sister died complications of Rheumatic valvular disease  Family History Diabetes 1st degree relative  Family History of Stroke F 1st degree relative <60  Family History Thyroid disease  Family History Uterine cancer   Social History:   Research scientist (medical) at [employer]; 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 Risk factors, based on past  M,S,F history-  cardiovascular as factors include dyslipidemia and a history of cerebrovascular disease  2.  Physical activities: Remains  active no exercise limitations  3.  Depression/mood: No history depression or mood disorder  4.  Hearing: No deficits  5.  ADL's: Independent in all aspects of daily living  6.  Fall risk: Low  7.  Home safety: No problems identified  8.  Height weight, and visual acuity; height and weight stable no change in visual acuity  9.  Counseling: Nonapplicable  10. Lab orders based on risk factors: Recent lab performed in the hospital in January  11. Referral : Followup neurology and hematology  12. Care plan: Continue low-salt diet regular exercise regimen prune eating habits  13. Cognitive assessment: Alert and oriented with normal affect. No cognitive dysfunction  14.  Preventive services will include colonoscopies at five-year intervals.  He will have annual preventive care.  Examinations with screening lab.  He has a history of impaired glucose tolerance and blood sugars will be checked periodically.  He will be seen at least annually by hematology due to his stable CLL.  Annual eye examinations recommended  15.  Provider list update includes ophthalmology hematology GI primary care medicine.      Review of Systems  Constitutional: Negative for fever, chills, activity change, appetite change and fatigue.  HENT: Negative for congestion, dental problem, ear pain, hearing loss, mouth sores, rhinorrhea, sinus pressure, sneezing, tinnitus, trouble swallowing and voice change.   Eyes: Negative for photophobia, pain, redness and visual disturbance.  Respiratory: Negative for apnea, cough, choking, chest tightness, shortness of breath and wheezing.   Cardiovascular: Negative for chest pain, palpitations and leg swelling.  Gastrointestinal: Negative for nausea, vomiting, abdominal pain, diarrhea, constipation, blood in stool, abdominal distention, anal bleeding and rectal pain.  Genitourinary: Negative for dysuria, urgency, frequency, hematuria, flank pain, decreased urine volume,  discharge, penile swelling, scrotal swelling, difficulty urinating, genital sores and testicular pain.  Musculoskeletal: Negative for myalgias, back pain, joint swelling, arthralgias, gait problem, neck pain and neck stiffness.  Skin: Negative for color change, rash and wound.  Neurological: Negative for dizziness, tremors, seizures, syncope, facial asymmetry, speech difficulty, weakness, light-headedness, numbness and headaches.  Hematological: Negative for adenopathy. Does not bruise/bleed easily.  Psychiatric/Behavioral: Negative for suicidal ideas, hallucinations, behavioral problems, confusion, sleep disturbance, self-injury, dysphoric mood, decreased concentration and agitation. The patient is not nervous/anxious.        Objective:   Physical Exam  Constitutional: He appears well-developed and well-nourished.  HENT:  Head: Normocephalic and atraumatic.  Right Ear: External ear normal.  Left Ear: External ear normal.  Nose: Nose normal.  Mouth/Throat: Oropharynx is clear and moist.  Perforation left TM Moderate wax.  Both canals  Eyes: Conjunctivae and EOM are  normal. Pupils are equal, round, and reactive to light. No scleral icterus.  Neck: Normal range of motion. Neck supple. No JVD present. No thyromegaly present.  Cardiovascular: Regular rhythm, normal heart sounds and intact distal pulses.  Exam reveals no gallop and no friction rub.   No murmur heard. Pulmonary/Chest: Effort normal and breath sounds normal. He exhibits no tenderness.  Abdominal: Soft. Bowel sounds are normal. He exhibits no distension and no mass. There is no tenderness.  Genitourinary: Penis normal.  Musculoskeletal: Normal range of motion. He exhibits edema. He exhibits no tenderness.  Plus 1 peripheral edema  Lymphadenopathy:    He has no cervical adenopathy.  Neurological: He is alert. He has normal reflexes. No cranial nerve deficit. Coordination normal.  Skin: Skin is warm and dry. No rash noted.   Psychiatric: He has a normal mood and affect. His behavior is normal.          Assessment & Plan:   Preventive health examination CLL stable BPH mild and largely asymptomatic History of colonic polyps  .  Will continue restricted salt diet  recheck one year or as needed .  Continue efforts at aggressive risk factor modification

## 2016-02-01 NOTE — Progress Notes (Signed)
Pre visit review using our clinic review tool, if applicable. No additional management support is needed unless otherwise documented below in the visit note. 

## 2016-02-14 DIAGNOSIS — H524 Presbyopia: Secondary | ICD-10-CM | POA: Diagnosis not present

## 2016-02-27 ENCOUNTER — Other Ambulatory Visit: Payer: Self-pay | Admitting: Internal Medicine

## 2016-07-18 ENCOUNTER — Telehealth: Payer: Self-pay | Admitting: Oncology

## 2016-07-18 ENCOUNTER — Ambulatory Visit (HOSPITAL_BASED_OUTPATIENT_CLINIC_OR_DEPARTMENT_OTHER): Payer: Medicare Other | Admitting: Oncology

## 2016-07-18 ENCOUNTER — Other Ambulatory Visit (HOSPITAL_BASED_OUTPATIENT_CLINIC_OR_DEPARTMENT_OTHER): Payer: Medicare Other

## 2016-07-18 VITALS — BP 147/88 | HR 98 | Temp 97.6°F | Resp 16 | Ht 68.0 in | Wt 201.8 lb

## 2016-07-18 DIAGNOSIS — C9110 Chronic lymphocytic leukemia of B-cell type not having achieved remission: Secondary | ICD-10-CM

## 2016-07-18 DIAGNOSIS — Z856 Personal history of leukemia: Secondary | ICD-10-CM

## 2016-07-18 DIAGNOSIS — R609 Edema, unspecified: Secondary | ICD-10-CM

## 2016-07-18 LAB — CBC WITH DIFFERENTIAL/PLATELET
BASO%: 0.4 % (ref 0.0–2.0)
BASOS ABS: 0.1 10*3/uL (ref 0.0–0.1)
EOS%: 0.8 % (ref 0.0–7.0)
Eosinophils Absolute: 0.1 10*3/uL (ref 0.0–0.5)
HCT: 45.9 % (ref 38.4–49.9)
HGB: 15.1 g/dL (ref 13.0–17.1)
LYMPH%: 70.3 % — ABNORMAL HIGH (ref 14.0–49.0)
MCH: 28.6 pg (ref 27.2–33.4)
MCHC: 32.9 g/dL (ref 32.0–36.0)
MCV: 86.9 fL (ref 79.3–98.0)
MONO#: 0.5 10*3/uL (ref 0.1–0.9)
MONO%: 3.7 % (ref 0.0–14.0)
NEUT#: 3.7 10*3/uL (ref 1.5–6.5)
NEUT%: 24.8 % — AB (ref 39.0–75.0)
Platelets: 153 10*3/uL (ref 140–400)
RBC: 5.29 10*6/uL (ref 4.20–5.82)
RDW: 13.4 % (ref 11.0–14.6)
WBC: 14.8 10*3/uL — ABNORMAL HIGH (ref 4.0–10.3)
lymph#: 10.4 10*3/uL — ABNORMAL HIGH (ref 0.9–3.3)

## 2016-07-18 LAB — COMPREHENSIVE METABOLIC PANEL
ALT: 17 U/L (ref 0–55)
AST: 19 U/L (ref 5–34)
Albumin: 3.5 g/dL (ref 3.5–5.0)
Alkaline Phosphatase: 81 U/L (ref 40–150)
Anion Gap: 9 mEq/L (ref 3–11)
BUN: 19.2 mg/dL (ref 7.0–26.0)
CHLORIDE: 108 meq/L (ref 98–109)
CO2: 23 mEq/L (ref 22–29)
Calcium: 9.3 mg/dL (ref 8.4–10.4)
Creatinine: 1.4 mg/dL — ABNORMAL HIGH (ref 0.7–1.3)
EGFR: 49 mL/min/{1.73_m2} — ABNORMAL LOW (ref >90)
GLUCOSE: 166 mg/dL — AB (ref 70–140)
POTASSIUM: 5.1 meq/L (ref 3.5–5.1)
SODIUM: 140 meq/L (ref 136–145)
Total Bilirubin: 0.82 mg/dL (ref 0.20–1.20)
Total Protein: 6.7 g/dL (ref 6.4–8.3)

## 2016-07-18 LAB — TECHNOLOGIST REVIEW

## 2016-07-18 NOTE — Telephone Encounter (Signed)
Appointments scheduled per 11/17 LOS. Patient given AVS report and calendars with future scheduled appointments. °

## 2016-07-18 NOTE — Progress Notes (Signed)
[facility] OFFICE PROGRESS NOTE 07/18/16  Nyoka Cowden, MD [place] [address]    DIAGNOSIS: 75 year old gentleman with early stage CLL diagnosed in 2002. He presented with leukocytosis without lymphadenopathy at stage 0. He continues to be asymptomatic.  CURRENT THERAPY: Observation and surveillance.  INTERVAL HISTORY: [name] returns for a follow up visit.  Since his last visit, he reports no major changes in his health. He denied any recent hospitalization or illnesses. He continues to be active with multiple hobbies including gardening and playing instruments. He denied any fevers, chills or major changes in his appetite. He denied any fatigue or tiredness. lly. He does not report any lymphadenopathy or other satiety.  He does not report any headaches, blurry vision, syncope or seizures. He does report residual neurological deficits from his stroke in 2014. He does not report any chest pain, shortness of breath or difficulty breathing. He does not report any cough or hemoptysis or wheezing. He does not report any nausea, vomiting, abdominal pain or early satiety. He does not report any frequency urgency or hesitancy. Rest of his review of systems unremarkable.  MEDICAL HISTORY: Past Medical History:  Diagnosis Date  . CKD (chronic kidney disease)   . CLL (chronic lymphoblastic leukemia) 2002  . 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 Glucose intolerance (impaired glucose tolerance)   . Hyperlipidemia   . Pedal edema    sease); CVA (cerebral infarction); and Impaired glucose tolerance on his problem list.    ALLERGIES:  has No Known Allergies.  MEDICATIONS: Mr. Schaal had no medications administered during this visit.  PHYSICAL EXAMINATION:  ECOG PERFORMANCE STATUS: 0 - Asymptomatic  Vitals:   07/18/16 1323  BP: (!) 147/88  Pulse: 98  Resp: 16  Temp: 97.6 F (36.4 C)   GENERAL:Well-appearing  gentleman without distress. SKIN: No rashes or lesions. OROPHARYNX: no exudate or oral thrush. No oral thrush or ulcers. NECK: no adenopathy or thyromegaly. LYMPH:  no palpable lymphadenopathy BREAST:not examined LUNGS: Clear to auscultation. No wheezes or dullness to percussion. HEART: regular rate & rhythm ABDOMEN:abdomen soft, non-tender, normal bowel sounds and no masses or rebound or guarding. BACK: no skin lesions, erythema or scars EXTREMITIES: 1+ edema.      LABORATORY DATA: CBC    Component Value Date/Time   WBC 14.8 (H) 07/18/2016 1309   WBC 15.4 (H) 01/30/2016 0944   RBC 5.29 07/18/2016 1309   RBC 5.26 01/30/2016 0944   HGB 15.1 07/18/2016 1309   HCT 45.9 07/18/2016 1309   PLT 153 07/18/2016 1309   MCV 86.9 07/18/2016 1309   MCH 28.6 07/18/2016 1309   MCH 28.5 09/16/2012 0545   MCHC 32.9 07/18/2016 1309   MCHC 33.4 01/30/2016 0944   RDW 13.4 07/18/2016 1309   LYMPHSABS 10.4 (H) 07/18/2016 1309   MONOABS 0.5 07/18/2016 1309   EOSABS 0.1 07/18/2016 1309   BASOSABS 0.1 07/18/2016 1309      ASSESSMENT AND PLAN:  75 year old gentleman with:  1. Stage 0 CLL Diagnosed in 2002. He continues to be asymptomatic since his diagnosis. His laboratory testing and physical examination does not reveal any progression of disease.   The plan is to continue with active surveillance on an annual basis and repeat laboratory testing in one year. I see no indications for treatment including worsening cytopenias, symptomatology or guarding compromise.  2. Lower extremity edema: He uses Lasix intermittently. These have not changed since last visit.  3. Follow-up: Will be in one year sooner if needed to.   [facility] MD 07/18/16

## 2016-07-31 ENCOUNTER — Other Ambulatory Visit: Payer: Self-pay | Admitting: Internal Medicine

## 2016-11-24 ENCOUNTER — Telehealth: Payer: Self-pay | Admitting: Internal Medicine

## 2016-11-24 DIAGNOSIS — H6123 Impacted cerumen, bilateral: Secondary | ICD-10-CM | POA: Diagnosis not present

## 2016-11-24 DIAGNOSIS — H906 Mixed conductive and sensorineural hearing loss, bilateral: Secondary | ICD-10-CM | POA: Diagnosis not present

## 2016-11-24 DIAGNOSIS — D229 Melanocytic nevi, unspecified: Secondary | ICD-10-CM

## 2016-11-24 NOTE — Telephone Encounter (Signed)
Attempted to leave a message with wife to have pt call office back but, when I informed wife that I could not tell her why I needed to speak with Mr [place] she hung up the phone.

## 2016-11-24 NOTE — Telephone Encounter (Signed)
Referral order placed in Epic. 

## 2016-11-24 NOTE — Telephone Encounter (Signed)
Pt has some moles on his face that are beginning to change color.  Also has moles on his chest.  Would like referral ro a dermatologist Dr Raliegh Ip advises.

## 2017-01-27 ENCOUNTER — Other Ambulatory Visit: Payer: Medicare Other

## 2017-02-02 ENCOUNTER — Encounter: Payer: Medicare Other | Admitting: Internal Medicine

## 2017-02-10 ENCOUNTER — Telehealth: Payer: Self-pay | Admitting: Internal Medicine

## 2017-02-10 ENCOUNTER — Ambulatory Visit (INDEPENDENT_AMBULATORY_CARE_PROVIDER_SITE_OTHER): Payer: Medicare Other | Admitting: Internal Medicine

## 2017-02-10 ENCOUNTER — Encounter: Payer: Self-pay | Admitting: Internal Medicine

## 2017-02-10 VITALS — BP 146/80 | HR 87 | Temp 97.3°F | Ht 69.0 in | Wt 195.8 lb

## 2017-02-10 DIAGNOSIS — D229 Melanocytic nevi, unspecified: Secondary | ICD-10-CM

## 2017-02-10 DIAGNOSIS — Z0001 Encounter for general adult medical examination with abnormal findings: Secondary | ICD-10-CM | POA: Diagnosis not present

## 2017-02-10 DIAGNOSIS — Z0000 Encounter for general adult medical examination without abnormal findings: Secondary | ICD-10-CM

## 2017-02-10 NOTE — Telephone Encounter (Signed)
° ° ° °  Pt decline AWV

## 2017-02-10 NOTE — Patient Instructions (Addendum)
WE NOW OFFER   [facility] FAST TRACK!!!  SAME DAY Appointments for ACUTE CARE  Such as: Sprains, Injuries, cuts, abrasions, rashes, muscle pain, joint pain, back pain Colds, flu, sore throats, headache, allergies, cough, fever  Ear pain, sinus and eye infections Abdominal pain, nausea, vomiting, diarrhea, upset stomach Animal/insect bites  3 Easy Ways to Schedule: Walk-In Scheduling Call in scheduling Mychart Sign-up: [email or web address]   Limit your sodium (Salt) intake  Dermatology referral as discussed    It is important that you exercise regularly, at least 20 minutes 3 to 4 times per week.  If you develop chest pain or shortness of breath seek  medical attention.

## 2017-02-10 NOTE — Progress Notes (Signed)
Subjective:    Patient ID: Samuel Stephens, male    DOB: 1941-03-29, 76 y.o.   MRN: [id number]  HPI  76  year-old patient who is seen today for a wellness exam.  He is followed by hematology annually for stable CLL. He has a history of mild dyslipidemia BPH and a history of colonic polyps. He had followup colonoscopy in February of 2012 and 5/15.   He feels quite well today no concerns or complaints.  He has recently retired from teaching and remains quite active. He denies any cardiopulmonary complaints.  He was hospitalized briefly in January of 2014 for a stroke resulted in left lower extremity weakness. He had a full neurological evaluation and has been evaluated by neurology.  He remains on Plavix for secondary prevention  Social history he has recently retired from Research scientist (medical) at [employer]   Past Medical History:  Diagnosis Date  . CKD (chronic kidney disease)   . CLL (chronic lymphoblastic leukemia) 2002  . 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 Glucose intolerance (impaired glucose tolerance)   . Hyperlipidemia   . Pedal edema      Social History   Social History  . Marital status: Married    Spouse name: N/A  . Number of children: 3  . Years of education: masters   Occupational History  . retired Retired   Social History Main Topics  . Smoking status: Never Smoker  . Smokeless tobacco: Never Used  . Alcohol use 0.0 oz/week     Comment: occassional/ a beer with every meal  . Drug use: No  . Sexual activity: Yes   Other Topics Concern  . Not on file   Social History Narrative  . No narrative on file    Past Surgical History:  Procedure Laterality Date  . COLONOSCOPY W/ BIOPSIES     multiple  . TEE WITHOUT CARDIOVERSION  09/16/2012   Procedure: TRANSESOPHAGEAL ECHOCARDIOGRAM (TEE);  Surgeon: Larey Dresser, MD;  Location: [facility] ENDOSCOPY;  Service: Cardiovascular;  Laterality: N/A;    Family History  Problem Relation Age of Onset  .  Stroke Mother   . Uterine cancer Mother   . Thyroid cancer Mother   . Stroke Father   . Diabetes Father   . Colon cancer Neg Hx   . Stomach cancer Neg Hx     No Known Allergies  Current Outpatient Prescriptions on File Prior to Visit  Medication Sig Dispense Refill  . clopidogrel (PLAVIX) 75 MG tablet TAKE 1 TABLET BY MOUTH EVERY DAY WITH BREAKFAST 90 tablet 1  . furosemide (LASIX) 40 MG tablet TAKE 1 TABLET (40 MG TOTAL) BY MOUTH DAILY AS NEEDED. FOR FLUID RETENTION 60 tablet 2  . Omega-3 Fatty Acids (FISH OIL) 1200 MG CAPS Take 1,200 mg by mouth every evening.    . simvastatin (ZOCOR) 40 MG tablet TAKE 1 TABLET (40 MG TOTAL) BY MOUTH AT BEDTIME. 90 tablet 3   No current facility-administered medications on file prior to visit.     BP (!) 146/80 (BP Location: Left Arm, Patient Position: Sitting, Cuff Size: Normal)   Pulse 87   Temp 97.3 F (36.3 C) (Oral)   Ht 5\' 9"  (1.753 m)   Wt 195 lb 12.8 oz (88.8 kg)   SpO2 95%   BMI 28.91 kg/m   Medicare wellness visit  1. Risk factors, based on past  M,S,F history.  Current vascular status include impaired glucose tolerance.  He also has a history of dyslipidemia and cerebrovascular disease  2.  Physical activities:remains quite active with yard work following his recent retirement.  Also goes to his health club frequently  3.  Depression/mood:no history of major depression or mood disorder  4.  Hearing:moderate deficits.  Plans on getting hearing aids in the near future  5.  ADL's:independent  6.  Fall risk:low  7.  Home safety:no problems identified  8.  Height weight, and visual acuity;height and weight stable no change in visual acuity  9.  Counseling:continue heart healthy diet and active lifestyle  10. Lab orders based on risk factors:laboratory studies reviewed from November 2017  11. Referral :hematology referral  12. Care plan:continue efforts at aggressive risk factor modification  13. Cognitive assessment:  alert and with normal affect no cognitive dysfunction  14. Screening: Patient provided with a written and personalized 5-10 year screening schedule in the AVS.    15. Provider List Update: primary care ophthalmology.  Hematology    Review of Systems  Constitutional: Negative for appetite change, chills, fatigue and fever.  HENT: Negative for congestion, dental problem, ear pain, hearing loss, sore throat, tinnitus, trouble swallowing and voice change.   Eyes: Negative for pain, discharge and visual disturbance.  Respiratory: Negative for cough, chest tightness, wheezing and stridor.   Cardiovascular: Positive for leg swelling. Negative for chest pain and palpitations.  Gastrointestinal: Negative for abdominal distention, abdominal pain, blood in stool, constipation, diarrhea, nausea and vomiting.  Genitourinary: Negative for difficulty urinating, discharge, flank pain, genital sores, hematuria and urgency.  Musculoskeletal: Negative for arthralgias, back pain, gait problem, joint swelling, myalgias and neck stiffness.  Skin: Positive for color change. Negative for rash.  Neurological: Negative for dizziness, syncope, speech difficulty, weakness, numbness and headaches.  Hematological: Negative for adenopathy. Does not bruise/bleed easily.  Psychiatric/Behavioral: Negative for behavioral problems and dysphoric mood. The patient is not nervous/anxious.        Objective:   Physical Exam  Constitutional: He appears well-developed and well-nourished.  HENT:  Head: Normocephalic and atraumatic.  Right Ear: External ear normal.  Left Ear: External ear normal.  Nose: Nose normal.  Mouth/Throat: Oropharynx is clear and moist.  Eyes: Conjunctivae and EOM are normal. Pupils are equal, round, and reactive to light. No scleral icterus.  Neck: Normal range of motion. Neck supple. No JVD present. No thyromegaly present.  Cardiovascular: Regular rhythm, normal heart sounds and intact distal  pulses.  Exam reveals no gallop and no friction rub.   No murmur heard. Pulmonary/Chest: Effort normal and breath sounds normal. He exhibits no tenderness.  Abdominal: Soft. Bowel sounds are normal. He exhibits no distension and no mass. There is no tenderness.  Genitourinary: Penis normal. Rectal exam shows guaiac negative stool.  Genitourinary Comments: Uncircumcised Prostate enlargement  Musculoskeletal: Normal range of motion. He exhibits edema. He exhibits no tenderness.  Lymphadenopathy:    He has no cervical adenopathy.  Neurological: He is alert. He has normal reflexes. No cranial nerve deficit. Coordination normal.  Skin: Skin is warm and dry. No rash noted.  3-4 mm pigmented lesion in the left preauricular area  Psychiatric: He has a normal mood and affect. His behavior is normal.          Assessment & Plan:   Preventive health examination Medicare wellness visit Pigmented lesion left preauricular area, rule out melanoma.  Dermatology referral Pedal edema.  Low-salt diet recommended History of cervical vascular disease.  Will continue Plavix and aggressive risk factor  modification Dyslipidemia.  Continue statin therapy  Follow-up one year  Nyoka Cowden

## 2017-02-12 ENCOUNTER — Other Ambulatory Visit: Payer: Self-pay | Admitting: Internal Medicine

## 2017-02-12 ENCOUNTER — Encounter: Payer: Self-pay | Admitting: Internal Medicine

## 2017-02-12 DIAGNOSIS — L819 Disorder of pigmentation, unspecified: Secondary | ICD-10-CM

## 2017-04-28 DIAGNOSIS — I872 Venous insufficiency (chronic) (peripheral): Secondary | ICD-10-CM | POA: Diagnosis not present

## 2017-04-28 DIAGNOSIS — D1801 Hemangioma of skin and subcutaneous tissue: Secondary | ICD-10-CM | POA: Diagnosis not present

## 2017-04-28 DIAGNOSIS — D485 Neoplasm of uncertain behavior of skin: Secondary | ICD-10-CM | POA: Diagnosis not present

## 2017-04-28 DIAGNOSIS — L821 Other seborrheic keratosis: Secondary | ICD-10-CM | POA: Diagnosis not present

## 2017-04-30 ENCOUNTER — Other Ambulatory Visit: Payer: Self-pay | Admitting: Internal Medicine

## 2017-05-12 ENCOUNTER — Other Ambulatory Visit: Payer: Self-pay | Admitting: Internal Medicine

## 2017-05-21 ENCOUNTER — Encounter: Payer: Self-pay | Admitting: Internal Medicine

## 2017-05-28 ENCOUNTER — Encounter: Payer: Self-pay | Admitting: Family Medicine

## 2017-05-28 ENCOUNTER — Ambulatory Visit (INDEPENDENT_AMBULATORY_CARE_PROVIDER_SITE_OTHER): Payer: Medicare Other | Admitting: Family Medicine

## 2017-05-28 VITALS — BP 140/70 | HR 112 | Temp 98.2°F | Wt 198.1 lb

## 2017-05-28 DIAGNOSIS — B349 Viral infection, unspecified: Secondary | ICD-10-CM

## 2017-05-28 LAB — POCT INFLUENZA A/B
Influenza A, POC: NEGATIVE
Influenza B, POC: NEGATIVE

## 2017-05-28 NOTE — Addendum Note (Signed)
Addended by: Milford Cage on: 05/28/2017 11:04 AM   Modules accepted: Orders

## 2017-05-28 NOTE — Progress Notes (Signed)
Subjective:    Patient ID: Samuel Stephens, male    DOB: [birth year], 76 y.o.   MRN: [id number]  No chief complaint on file.   HPI Patient was seen today for acute illness.  -Sun/Monday hoarse voice.  -Tues night trembling in hand/arm noted (pt's prodrome to fever)  That night temp 103.27F by TM.  Pt's wife placed iced towels on chest and neck.  Temp came down to 100.92F.  Pt was unable to sleep. -This am temp 103.5 F.  Pt used iced towels again. -This am temp 102F.  Pt took ibuprofen. -Other symptoms include dry cough, chills, no appetite -Pt denies HA, neck pain, SOB, rhinorrhea, N/V, and sick contacts -Pt had a influenza vaccine a few wks ago -Pt notes similar illness ~2 yrs ago.  Of note pt with h/o CLL in remission.  Was asymptomatic at time of dx.  Also, pt mentions he was told not to take ASA.  Past Medical History:  Diagnosis Date  . CKD (chronic kidney disease)   . CLL (chronic lymphoblastic leukemia) 2002  . 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 Glucose intolerance (impaired glucose tolerance)   . Hyperlipidemia   . Pedal edema      Outpatient Medications Prior to Visit  Medication Sig Dispense Refill  . clopidogrel (PLAVIX) 75 MG tablet TAKE 1 TABLET BY MOUTH EVERY DAY WITH BREAKFAST 90 tablet 1  . furosemide (LASIX) 40 MG tablet TAKE 1 TABLET (40 MG TOTAL) BY MOUTH DAILY AS NEEDED. FOR FLUID RETENTION 60 tablet 2  . Omega-3 Fatty Acids (FISH OIL) 1200 MG CAPS Take 1,200 mg by mouth every evening.    . simvastatin (ZOCOR) 40 MG tablet TAKE 1 TABLET (40 MG TOTAL) BY MOUTH AT BEDTIME. 90 tablet 3  . simvastatin (ZOCOR) 40 MG tablet TAKE 1 TABLET (40 MG TOTAL) BY MOUTH AT BEDTIME. 90 tablet 1   No facility-administered medications prior to visit.     No Known Allergies  ROS General: Denies night sweats, changes in weight  +chills, fever, decreased appetite. HEENT: Denies headaches, ear pain, changes in vision, rhinorrhea, sore throat  CV: Denies  CP, palpitations, SOB, orthopnea Pulm: Denies SOB, wheezing  +dry cough GI: Denies abdominal pain, nausea, vomiting, diarrhea, constipation GU: Denies dysuria, hematuria, frequency, vaginal discharge Msk: Denies muscle cramps, joint pains Neuro: Denies weakness, numbness, tingling Skin: Denies rashes, bruising Psych: Denies depression, anxiety, hallucinations     Objective:    Blood pressure 140/70, pulse (!) 112, temperature 98.2 F (36.8 C), temperature source Oral, weight 198 lb 1.6 oz (89.9 kg), SpO2 95 %. SpO2 initially 93%, improved to 95% on repeat.  Gen. Pleasant, well-nourished, in no distress, normal affect .  Does not appear toxic. HEENT: Wearing glasses and hearing aid in L ear.  [place]/AT, face symmetric, no scleral icterus, PERRLA, nares patent without drainage, pharynx with mild erythema, no exudate. Lungs: intermittent dry cough, no accessory muscle use, CTAB, no wheezes or rales.   Cardiovascular: tachycardic, no m/r/g, no peripheral edema Abdomen: soft and non-tender, no hepatosplenomegaly, BS normal. Neuro:  A&Ox3, CN II-XII intact, normal gait Skin:  Warm, no lesions/ rash   Wt Readings from Last 3 Encounters:  05/28/17 198 lb 1.6 oz (89.9 kg)  02/10/17 195 lb 12.8 oz (88.8 kg)  07/18/16 201 lb 12.8 oz (91.5 kg)    Assessment/Plan:  Viral illness -discussed likely viral illness given fever and dry cough.  Received flu shot a few wks  ago. -Rapid flu--negative -Discussed continuing supportive care.  Ok to use Tylenol for fever. -Encouraged to drink plenty of fluids if does not feel like eating -SpO2 initially 93% but improved to 95%.  Discussed if feeling worse, then RTC tomorrow given upcoming weekend. -CXR deferred given negative findings on PE. -given handout on above.  F/u prn

## 2017-05-28 NOTE — Patient Instructions (Addendum)
Viral Illness, Adult Viruses are tiny germs that can get into a person's body and cause illness. There are many different types of viruses, and they cause many types of illness. Viral illnesses can range from mild to severe. They can affect various parts of the body. Common illnesses that are caused by a virus include colds and the flu.  Many types of viruses can cause illness. Viruses invade cells in your body, multiply, and cause the infected cells to malfunction or die. When the cell dies, it releases more of the virus. When this happens, you develop symptoms of the illness, and the virus continues to spread to other cells. If the virus takes over the function of the cell, it can cause the cell to divide and grow out of control, as is the case when a virus causes cancer. Different viruses get into the body in different ways. You can get a virus by:  Swallowing food or water that is contaminated with the virus.  Breathing in droplets that have been coughed or sneezed into the air by an infected person.  Touching a surface that has been contaminated with the virus and then touching your eyes, nose, or mouth.  Being bitten by an insect or animal that carries the virus.  Having sexual contact with a person who is infected with the virus.  Being exposed to blood or fluids that contain the virus, either through an open cut or during a transfusion.  If a virus enters your body, your body's defense system (immune system) will try to fight the virus. You may be at higher risk for a viral illness if your immune system is weak. What are the signs or symptoms? Symptoms vary depending on the type of virus and the location of the cells that it invades. Common symptoms of the main types of viral illnesses include: Cold and flu viruses  Fever.  Headache.  Sore throat.  Muscle aches.  Nasal congestion.  Cough. Digestive system (gastrointestinal) viruses  Fever.  Abdominal  pain.  Nausea.  Diarrhea. Liver viruses (hepatitis)  Loss of appetite.  Tiredness.  Yellowing of the skin (jaundice). Brain and spinal cord viruses  Fever.  Headache.  Stiff neck.  Nausea and vomiting.  Confusion or sleepiness. Skin viruses  Warts.  Itching.  Rash. Sexually transmitted viruses  Discharge.  Swelling.  Redness.  Rash. How is this treated? Viruses can be difficult to treat because they live within cells. Antibiotic medicines do not treat viruses because these drugs do not get inside cells. Treatment for a viral illness may include:  Resting and drinking plenty of fluids.  Medicines to relieve symptoms. These can include over-the-counter medicine for pain and fever, medicines for cough or congestion, and medicines to relieve diarrhea.  Antiviral medicines. These drugs are available only for certain types of viruses. They may help reduce flu symptoms if taken early. There are also many antiviral medicines for hepatitis and HIV/AIDS.  Some viral illnesses can be prevented with vaccinations. A common example is the flu shot. Follow these instructions at home: Medicines   Take over-the-counter and prescription medicines only as told by your health care provider.  If you were prescribed an antiviral medicine, take it as told by your health care provider. Do not stop taking the medicine even if you start to feel better.  Be aware of when antibiotics are needed and when they are not needed. Antibiotics do not treat viruses. If your health care provider thinks that you may have  a bacterial infection as well as a viral infection, you may get an antibiotic. ? Do not ask for an antibiotic prescription if you have been diagnosed with a viral illness. That will not make your illness go away faster. ? Frequently taking antibiotics when they are not needed can lead to antibiotic resistance. When this develops, the medicine no longer works against the bacteria  that it normally fights. General instructions  Drink enough fluids to keep your urine clear or pale yellow.  Rest as much as possible.  Return to your normal activities as told by your health care provider. Ask your health care provider what activities are safe for you.  Keep all follow-up visits as told by your health care provider. This is important. How is this prevented? Take these actions to reduce your risk of viral infection:  Eat a healthy diet and get enough rest.  Wash your hands often with soap and water. This is especially important when you are in public places. If soap and water are not available, use hand sanitizer.  Avoid close contact with friends and family who have a viral illness.  If you travel to areas where viral gastrointestinal infection is common, avoid drinking water or eating raw food.  Keep your immunizations up to date. Get a flu shot every year as told by your health care provider.  Do not share toothbrushes, nail clippers, razors, or needles with other people.  Always practice safe sex.  Contact a health care provider if:  You have symptoms of a viral illness that do not go away.  Your symptoms come back after going away.  Your symptoms get worse. Get help right away if:  You have trouble breathing.  You have a severe headache or a stiff neck.  You have severe vomiting or abdominal pain. This information is not intended to replace advice given to you by your health care provider. Make sure you discuss any questions you have with your health care provider. Document Released: 12/28/2015 Document Revised: 01/30/2016 Document Reviewed: 12/28/2015 Elsevier Interactive Patient Education  [employer].

## 2017-06-01 ENCOUNTER — Ambulatory Visit (INDEPENDENT_AMBULATORY_CARE_PROVIDER_SITE_OTHER)
Admission: RE | Admit: 2017-06-01 | Discharge: 2017-06-01 | Disposition: A | Discharge status: Home / Self Care | Payer: Medicare Other | Source: Ambulatory Visit | Attending: Internal Medicine | Admitting: Internal Medicine

## 2017-06-01 ENCOUNTER — Ambulatory Visit (INDEPENDENT_AMBULATORY_CARE_PROVIDER_SITE_OTHER): Payer: Medicare Other | Admitting: Internal Medicine

## 2017-06-01 ENCOUNTER — Encounter: Payer: Self-pay | Admitting: Internal Medicine

## 2017-06-01 ENCOUNTER — Telehealth: Payer: Self-pay | Admitting: *Deleted

## 2017-06-01 VITALS — BP 118/62 | HR 108 | Temp 99.0°F | Ht 69.0 in | Wt 193.2 lb

## 2017-06-01 DIAGNOSIS — N183 Chronic kidney disease, stage 3 unspecified: Secondary | ICD-10-CM

## 2017-06-01 DIAGNOSIS — R05 Cough: Secondary | ICD-10-CM

## 2017-06-01 DIAGNOSIS — R509 Fever, unspecified: Secondary | ICD-10-CM

## 2017-06-01 DIAGNOSIS — R059 Cough, unspecified: Secondary | ICD-10-CM

## 2017-06-01 DIAGNOSIS — C9190 Lymphoid leukemia, unspecified not having achieved remission: Secondary | ICD-10-CM

## 2017-06-01 DIAGNOSIS — C9110 Chronic lymphocytic leukemia of B-cell type not having achieved remission: Secondary | ICD-10-CM

## 2017-06-01 LAB — COMPREHENSIVE METABOLIC PANEL
ALK PHOS: 244 U/L — AB (ref 39–117)
ALT: 158 U/L — AB (ref 0–53)
AST: 181 U/L — ABNORMAL HIGH (ref 0–37)
Albumin: 3.1 g/dL — ABNORMAL LOW (ref 3.5–5.2)
BILIRUBIN TOTAL: 2 mg/dL — AB (ref 0.2–1.2)
BUN: 20 mg/dL (ref 6–23)
CO2: 26 meq/L (ref 19–32)
CREATININE: 1.08 mg/dL (ref 0.40–1.50)
Calcium: 8.7 mg/dL (ref 8.4–10.5)
Chloride: 99 mEq/L (ref 96–112)
GFR: 70.75 mL/min (ref >60.00)
GLUCOSE: 137 mg/dL — AB (ref 70–99)
Potassium: 3.7 mEq/L (ref 3.5–5.1)
SODIUM: 135 meq/L (ref 135–145)
TOTAL PROTEIN: 5.9 g/dL — AB (ref 6.0–8.3)

## 2017-06-01 LAB — CBC WITH DIFFERENTIAL/PLATELET
BASOS ABS: 0 10*3/uL (ref 0.0–0.1)
Basophils Relative: 0.3 % (ref 0.0–3.0)
Eosinophils Absolute: 0 10*3/uL (ref 0.0–0.7)
Eosinophils Relative: 0.2 % (ref 0.0–5.0)
HCT: 42.4 % (ref 39.0–52.0)
Hemoglobin: 14 g/dL (ref 13.0–17.0)
LYMPHS ABS: 8.8 10*3/uL — AB (ref 0.7–4.0)
Lymphocytes Relative: 54.8 % — ABNORMAL HIGH (ref 12.0–46.0)
MCHC: 32.9 g/dL (ref 30.0–36.0)
MCV: 86.4 fl (ref 78.0–100.0)
Monocytes Absolute: 0.9 10*3/uL (ref 0.1–1.0)
Monocytes Relative: 5.3 % (ref 3.0–12.0)
NEUTROS ABS: 6.4 10*3/uL (ref 1.4–7.7)
NEUTROS PCT: 39.4 % — AB (ref 43.0–77.0)
PLATELETS: 262 10*3/uL (ref 150.0–400.0)
RBC: 4.91 Mil/uL (ref 4.22–5.81)
RDW: 14.3 % (ref 11.5–15.5)
WBC: 16.1 10*3/uL — ABNORMAL HIGH (ref 4.0–10.5)

## 2017-06-01 MED ORDER — HYDROCODONE-HOMATROPINE 5-1.5 MG/5ML PO SYRP: 5.0000 mL | ORAL_SOLUTION | Freq: Four times a day (QID) | ORAL | 0 refills | Status: DC | PRN

## 2017-06-01 NOTE — Patient Instructions (Addendum)
Acute bronchitis symptoms for less than 10 days are generally not helped by antibiotics.  Take over-the-counter expectorants and cough medications such as  Mucinex DM.  Call if there is no improvement in 5 to 7 days or if  you develop worsening cough, fever, or new symptoms, such as shortness of breath or chest pain.  Take [phone] mg of Tylenol every 6 hours as needed for pain relief or fever.  Avoid taking more than 3000 mg in a 24-hour period (  This may cause liver damage).  Hydrate and Humidify  Drink enough water to keep your urine clear or pale yellow. Staying hydrated will help to thin your mucus.  Use a cool mist humidifier to keep the humidity level in your home above 50%.  Inhale steam for 10-15 minutes, 3-4 times a day or as told by your health care provider. You can do this in the bathroom while a hot shower is running.  Limit your exposure to cool or dry air. Rest  Rest as much as possible.  Chest x-ray as discussed  Call or return to clinic prn if these symptoms worsen or fail to improve as anticipated.

## 2017-06-01 NOTE — Telephone Encounter (Signed)
Patient called stating he is not feeling any better and he has had a fever of 103.8 Friday and  patient states he got his fever down to 102.2 after taking a cool shower and keeping ice on him. Patient states Saturday his fever was 99 and went back up. Yesterday patient states he had a fever all day yesterday of 101, patient said he does not feel like walking or getting out of bed. I offered patient an appointment patient states he does not feel like getting out of bed. Patient would like to talk to the nurse about his fever and what he should do. Please call [phone]. Patient states this will be going on a week tomorrow.

## 2017-06-01 NOTE — Telephone Encounter (Signed)
Spoke with pt and made an OV for 06/01/17 at 1:30pm.

## 2017-06-01 NOTE — Progress Notes (Signed)
Subjective:    Patient ID: LEVERETT CAMPLIN, male    DOB: 1941/04/24, 76 y.o.   MRN: [id number]  HPI  76 year old patient who presents with a six-day history of fever and cough.  He states that he had a very similar illness approximately 2 years ago with high fever, cough, associated with chills and weakness. He was seen in the office 2 days later where screening was negative for influenza.  He has had a flu vaccine earlier in the year. At the present time.  He continues to have intermittent fever, weakness and cough which is largely nonproductive.  Temperature has been as high as 103 Denies any wheezing or shortness of breath  Past Medical History:  Diagnosis Date  . CKD (chronic kidney disease)   . CLL (chronic lymphoblastic leukemia) 2002  . 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 Glucose intolerance (impaired glucose tolerance)   . Hyperlipidemia   . Pedal edema      Social History   Social History  . Marital status: Married    Spouse name: N/A  . Number of children: 3  . Years of education: masters   Occupational History  . retired Retired   Social History Main Topics  . Smoking status: Never Smoker  . Smokeless tobacco: Never Used  . Alcohol use 0.0 oz/week     Comment: occassional/ a beer with every meal  . Drug use: No  . Sexual activity: Yes   Other Topics Concern  . Not on file   Social History Narrative  . No narrative on file    Past Surgical History:  Procedure Laterality Date  . COLONOSCOPY W/ BIOPSIES     multiple  . TEE WITHOUT CARDIOVERSION  09/16/2012   Procedure: TRANSESOPHAGEAL ECHOCARDIOGRAM (TEE);  Surgeon: Larey Dresser, MD;  Location: [facility] ENDOSCOPY;  Service: Cardiovascular;  Laterality: N/A;    Family History  Problem Relation Age of Onset  . Stroke Mother   . Uterine cancer Mother   . Thyroid cancer Mother   . Stroke Father   . Diabetes Father   . Colon cancer Neg Hx   . Stomach cancer Neg Hx     No Known  Allergies  Current Outpatient Prescriptions on File Prior to Visit  Medication Sig Dispense Refill  . clopidogrel (PLAVIX) 75 MG tablet TAKE 1 TABLET BY MOUTH EVERY DAY WITH BREAKFAST 90 tablet 1  . furosemide (LASIX) 40 MG tablet TAKE 1 TABLET (40 MG TOTAL) BY MOUTH DAILY AS NEEDED. FOR FLUID RETENTION 60 tablet 2  . simvastatin (ZOCOR) 40 MG tablet TAKE 1 TABLET (40 MG TOTAL) BY MOUTH AT BEDTIME. 90 tablet 1  . Omega-3 Fatty Acids (FISH OIL) 1200 MG CAPS Take 1,200 mg by mouth every evening.     No current facility-administered medications on file prior to visit.     BP 118/62 (BP Location: Left Arm, Patient Position: Sitting, Cuff Size: Normal)   Pulse (!) 108   Temp 99 F (37.2 C) (Oral)   Ht 5\' 9"  (1.753 m)   Wt 193 lb 3.2 oz (87.6 kg)   SpO2 93%   BMI 28.53 kg/m     Review of Systems  Constitutional: Positive for activity change, appetite change, chills, fatigue and fever.  Respiratory: Positive for cough.        Objective:   Physical Exam  Constitutional: He is oriented to person, place, and time. He appears well-developed.  Appears weak but in  no acute distress Temperature 90.9 degrees Pulse rate 100  HENT:  Head: Normocephalic.  Right Ear: External ear normal.  Left Ear: External ear normal.  Eyes: Conjunctivae and EOM are normal.  Neck: Normal range of motion.  Cardiovascular: Normal rate, regular rhythm and normal heart sounds.   Mild resting tachycardia  Pulmonary/Chest: Effort normal and breath sounds normal. No respiratory distress. He has no wheezes. He has no rales.  Abdominal: Bowel sounds are normal.  Musculoskeletal: Normal range of motion. He exhibits no edema or tenderness.  Neurological: He is alert and oriented to person, place, and time.  Psychiatric: He has a normal mood and affect. His behavior is normal.          Assessment & Plan:   Acute febrile illness.  Day 7.  We'll check lab in a chest x-ray.  He is requesting a cough medicine.   He states that he has slept poorly.  Since the onset of his illness. Will force fluids and treat fever aggressively. Review chest x-ray to rule out community-acquired pneumonia Review  lab  Return if any clinical worsening Will force fluids  Nyoka Cowden

## 2017-06-02 ENCOUNTER — Encounter (HOSPITAL_COMMUNITY): Payer: Self-pay | Admitting: Nurse Practitioner

## 2017-06-02 ENCOUNTER — Emergency Department (HOSPITAL_COMMUNITY): Payer: Medicare Other

## 2017-06-02 ENCOUNTER — Emergency Department (HOSPITAL_COMMUNITY)
Admission: EM | Admit: 2017-06-02 | Discharge: 2017-06-02 | Disposition: A | Discharge status: Home / Self Care | Payer: Medicare Other | Attending: Emergency Medicine | Admitting: Emergency Medicine

## 2017-06-02 DIAGNOSIS — Z7902 Long term (current) use of antithrombotics/antiplatelets: Secondary | ICD-10-CM | POA: Diagnosis not present

## 2017-06-02 DIAGNOSIS — J189 Pneumonia, unspecified organism: Secondary | ICD-10-CM | POA: Diagnosis not present

## 2017-06-02 DIAGNOSIS — Z79899 Other long term (current) drug therapy: Secondary | ICD-10-CM | POA: Insufficient documentation

## 2017-06-02 DIAGNOSIS — K824 Cholesterolosis of gallbladder: Secondary | ICD-10-CM | POA: Diagnosis not present

## 2017-06-02 DIAGNOSIS — R945 Abnormal results of liver function studies: Secondary | ICD-10-CM | POA: Diagnosis not present

## 2017-06-02 DIAGNOSIS — N189 Chronic kidney disease, unspecified: Secondary | ICD-10-CM | POA: Diagnosis not present

## 2017-06-02 DIAGNOSIS — R509 Fever, unspecified: Secondary | ICD-10-CM | POA: Diagnosis present

## 2017-06-02 DIAGNOSIS — R7989 Other specified abnormal findings of blood chemistry: Secondary | ICD-10-CM

## 2017-06-02 DIAGNOSIS — Z856 Personal history of leukemia: Secondary | ICD-10-CM | POA: Diagnosis not present

## 2017-06-02 LAB — COMPREHENSIVE METABOLIC PANEL
ALK PHOS: 201 U/L — AB (ref 38–126)
ALT: 134 U/L — AB (ref 17–63)
AST: 92 U/L — ABNORMAL HIGH (ref 15–41)
Albumin: 2.5 g/dL — ABNORMAL LOW (ref 3.5–5.0)
Anion gap: 8 (ref 5–15)
BUN: 18 mg/dL (ref 6–20)
CHLORIDE: 102 mmol/L (ref 101–111)
CO2: 24 mmol/L (ref 22–32)
Calcium: 8.1 mg/dL — ABNORMAL LOW (ref 8.9–10.3)
Creatinine, Ser: 1.1 mg/dL (ref 0.61–1.24)
GFR calc Af Amer: >60 mL/min (ref >60)
Glucose, Bld: 140 mg/dL — ABNORMAL HIGH (ref 65–99)
Potassium: 3.5 mmol/L (ref 3.5–5.1)
Sodium: 134 mmol/L — ABNORMAL LOW (ref 135–145)
Total Bilirubin: 1.4 mg/dL — ABNORMAL HIGH (ref 0.3–1.2)
Total Protein: 6.4 g/dL — ABNORMAL LOW (ref 6.5–8.1)

## 2017-06-02 LAB — CBC WITH DIFFERENTIAL/PLATELET
Basophils Absolute: 0 10*3/uL (ref 0.0–0.1)
Basophils Relative: 0 %
Eosinophils Absolute: 0 10*3/uL (ref 0.0–0.7)
Eosinophils Relative: 0 %
HEMATOCRIT: 37.4 % — AB (ref 39.0–52.0)
HEMOGLOBIN: 13 g/dL (ref 13.0–17.0)
LYMPHS ABS: 7.4 10*3/uL — AB (ref 0.7–4.0)
LYMPHS PCT: 55 %
MCH: 28.4 pg (ref 26.0–34.0)
MCHC: 34.8 g/dL (ref 30.0–36.0)
MCV: 81.8 fL (ref 78.0–100.0)
Monocytes Absolute: 0.8 10*3/uL (ref 0.1–1.0)
Monocytes Relative: 6 %
NEUTROS ABS: 5.2 10*3/uL (ref 1.7–7.7)
Neutrophils Relative %: 39 %
Platelets: 220 10*3/uL (ref 150–400)
RBC: 4.57 MIL/uL (ref 4.22–5.81)
RDW: 13.4 % (ref 11.5–15.5)
WBC: 13.4 10*3/uL — AB (ref 4.0–10.5)

## 2017-06-02 MED ORDER — DOXYCYCLINE HYCLATE 100 MG PO TABS
100.0000 mg | ORAL_TABLET | Freq: Once | ORAL | Status: AC
Start: 2017-06-02 — End: 2017-06-02
  Administered 2017-06-02: 100 mg via ORAL
  Filled 2017-06-02: qty 1

## 2017-06-02 MED ORDER — DOXYCYCLINE HYCLATE 100 MG PO CAPS: 100.0000 mg | ORAL_CAPSULE | Freq: Two times a day (BID) | ORAL | 0 refills | Status: DC

## 2017-06-02 MED ORDER — ALBUTEROL SULFATE HFA 108 (90 BASE) MCG/ACT IN AERS
2.0000 | INHALATION_SPRAY | Freq: Once | RESPIRATORY_TRACT | Status: AC
  Administered 2017-06-02: 2 via RESPIRATORY_TRACT
  Filled 2017-06-02: qty 6.7

## 2017-06-02 MED ORDER — SODIUM CHLORIDE 0.9 % IV BOLUS (SEPSIS)
1000.0000 mL | Freq: Once | INTRAVENOUS | Status: AC
  Administered 2017-06-02: 1000 mL via INTRAVENOUS

## 2017-06-02 NOTE — Discharge Instructions (Signed)
Do not take or use alcohol or Tylenol at this time. These can both affect the liver, as can many other medicines. Your liver function tests are better today but still a little abnormal. Follow-up with your doctor in the next couple days for a recheck and recheck of your liver. Return to the ER if you feel worse or new symptoms develop or arise.

## 2017-06-02 NOTE — ED Triage Notes (Signed)
Patient reports having cold/flu like symtpoms over the past week. He was seen by his PCP at [facility]. States they told him he had a cold and to continue to taking OTC mucinex and cold medications. Patient reports some body aches and pain. He has been taking ibuprofen and Tylenol for temp over the past few days. PCP is aware that temp was as high as 103 3 days ago and has been managed with OTC medications. Wife is at bedside and stated PCP called this morning after receiving blood work and CXR and suggested if he was still feeling bad to come to ER for further evaluation.

## 2017-06-02 NOTE — ED Notes (Signed)
Bed: WA02 Expected date:  Expected time:  Means of arrival:  Comments: 

## 2017-06-02 NOTE — ED Triage Notes (Signed)
Pt is alert and appropriate. Wife at bedside. Denies chest pain, denies shortness of breath. O2 sat 94% HR 85.  2l [place] initiated

## 2017-06-02 NOTE — ED Provider Notes (Signed)
[facility] DEPT Provider Note   CSN: [id number] Arrival date & time: 06/02/17  4098     History   Chief Complaint Chief Complaint  Patient presents with  . Fever  . URI    HPI Samuel Stephens is a 76 y.o. male.  HPI  76 year old male with a history of CLL presents with fever for 7 days.He states that he's been having a fever up to 104 on and off for the past 7 days. He has not had any headaches, sore throat, rhinorrhea, congestion, chest pain or abdominal pain. He has had some shortness of breath when walking only and a mild cough. Saw his PCP last week and had a negative flu swab. Saw his PCP again yesterday and had lab work and a chest x-ray done. He was called by his PCP today to come to the ER because of his chest x-ray results and his abnormal liver enzymes seen on lab work. The patient denies any abdominal pain or vomiting. He has mostly been taking ibuprofen for fever but starting a few days ago he did take Tylenol. However he has not taken more than 2000 mg per day. He has not taken anything else over-the-counter besides Mucinex DM. No diarrhea. No recent admissions and he lives at home with his wife. He is not on any immunosuppression.  Past Medical History:  Diagnosis Date  . CKD (chronic kidney disease)   . CLL (chronic lymphoblastic leukemia) 2002  . 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 Glucose intolerance (impaired glucose tolerance)   . Hyperlipidemia   . Pedal edema     Patient Active Problem List   Diagnosis Date Noted  . Impaired glucose tolerance 01/18/2014  . CVA (cerebral infarction) 09/15/2012  . TIA (transient ischemic attack) 09/14/2012  . CKD (chronic kidney disease) 09/14/2012  . PEDAL EDEMA 06/22/2009  . Chronic lymphocytic leukemia ([place]) 02/18/2008  . BENIGN PROSTATIC HYPERTROPHY 02/18/2008  . History of colonic polyps 03/26/2007  . Dyslipidemia 02/18/2007    Past Surgical History:  Procedure Laterality Date  . COLONOSCOPY  W/ BIOPSIES     multiple  . TEE WITHOUT CARDIOVERSION  09/16/2012   Procedure: TRANSESOPHAGEAL ECHOCARDIOGRAM (TEE);  Surgeon: Larey Dresser, MD;  Location: [place];  Service: Cardiovascular;  Laterality: N/A;       Home Medications    Prior to Admission medications   Medication Sig Start Date End Date Taking? Authorizing Provider  acetaminophen (TYLENOL) 500 MG tablet Take 500 mg by mouth every 6 (six) hours as needed for mild pain or fever.   Yes [provider]  clopidogrel (PLAVIX) 75 MG tablet TAKE 1 TABLET BY MOUTH EVERY DAY WITH BREAKFAST 04/30/17  Yes [name], MD  dextromethorphan-guaiFENesin [facility] DM) 30-600 MG 12hr tablet Take 1 tablet by mouth 2 (two) times daily.   Yes [provider]  furosemide (LASIX) 40 MG tablet TAKE 1 TABLET (40 MG TOTAL) BY MOUTH DAILY AS NEEDED. FOR FLUID RETENTION 01/29/16  Yes [name], MD  guaifenesin (ROBITUSSIN) 100 MG/5ML syrup Take 200-400 mg by mouth 3 (three) times daily as needed for cough.   Yes [provider]  HYDROcodone-homatropine (HYCODAN) 5-1.5 MG/5ML syrup Take 5 mLs by mouth every 6 (six) hours as needed for cough. 06/01/17  Yes [name], MD  ibuprofen (ADVIL,MOTRIN) 200 MG tablet Take 600 mg by mouth every 6 (six) hours as needed for fever.   Yes [provider]  Omega-3 Fatty Acids (FISH OIL) 1200 MG CAPS Take 1,200 mg by mouth every evening.   Yes [provider]  simvastatin (ZOCOR) 40 MG tablet TAKE 1 TABLET (40 MG TOTAL) BY MOUTH AT BEDTIME. 05/13/17  Yes [name], MD  doxycycline (VIBRAMYCIN) 100 MG capsule Take 1 capsule (100 mg total) by mouth 2 (two) times daily. One po bid x 7 days 06/02/17   Sherwood Gambler, MD    Family History Family History  Problem Relation Age of Onset  . Stroke Mother   . Uterine cancer Mother   . Thyroid cancer Mother   . Stroke Father   . Diabetes Father   . Colon cancer Neg Hx   . Stomach  cancer Neg Hx     Social History Social History  Substance Use Topics  . Smoking status: Never Smoker  . Smokeless tobacco: Never Used  . Alcohol use 0.0 oz/week     Comment: occassional/ a beer with every meal     Allergies   Patient has no known allergies.   Review of Systems Review of Systems  Constitutional: Positive for chills and fever.  HENT: Negative for congestion, rhinorrhea and sore throat.   Respiratory: Positive for cough and shortness of breath.   Cardiovascular: Negative for chest pain.  Gastrointestinal: Negative for abdominal pain, diarrhea and vomiting.  Genitourinary: Negative for dysuria.  Musculoskeletal: Negative for arthralgias, myalgias, neck pain and neck stiffness.  Neurological: Negative for headaches.  All other systems reviewed and are negative.    Physical Exam Updated Vital Signs BP 133/75   Pulse 83   Temp 98.1 F (36.7 C) (Oral)   Resp 17   Ht 5\' 9"  (1.753 m)   Wt 88 kg (194 lb)   SpO2 93%   BMI 28.65 kg/m   Physical Exam  Constitutional: He is oriented to person, place, and time. He appears well-developed and well-nourished. No distress.  HENT:  Head: Normocephalic and atraumatic.  Right Ear: External ear normal.  Left Ear: External ear normal.  Nose: Nose normal.  Mouth/Throat: Oropharynx is clear and moist. No oropharyngeal exudate.  Eyes: Right eye exhibits no discharge. Left eye exhibits no discharge.  Neck: Normal range of motion. Neck supple.  Cardiovascular: Normal rate, regular rhythm and normal heart sounds.   Pulmonary/Chest: Effort normal. No accessory muscle usage. No tachypnea. He has wheezes (slight expiratory wheezes in upper lobes).  Abdominal: Soft. He exhibits no distension. There is no tenderness.  Musculoskeletal: He exhibits no edema.  Neurological: He is alert and oriented to person, place, and time.  Skin: Skin is warm and dry. He is not diaphoretic.  Nursing note and vitals reviewed.    ED  Treatments / Results  Labs (all labs ordered are listed, but only abnormal results are displayed) Labs Reviewed  COMPREHENSIVE METABOLIC PANEL - Abnormal; Notable for the following:       Result Value   Sodium 134 (*)    Glucose, Bld 140 (*)    Calcium 8.1 (*)    Total Protein 6.4 (*)    Albumin 2.5 (*)    AST 92 (*)    ALT 134 (*)    Alkaline Phosphatase 201 (*)    Total Bilirubin 1.4 (*)    All other components within normal limits  CBC WITH DIFFERENTIAL/PLATELET - Abnormal; Notable for the following:    WBC 13.4 (*)    HCT 37.4 (*)    Lymphs Abs 7.4 (*)    All other  components within normal limits  HEPATITIS PANEL, ACUTE    EKG  EKG Interpretation None       Radiology Dg Chest 2 View  Result Date: 06/02/2017 CLINICAL DATA:  Ten days of cough, chest congestion, shortness of breath, fever, and chills. History of chronic lymphoblastic leukemia EXAM: CHEST  2 VIEW COMPARISON:  Portable chest x-ray of September 14, 2012 FINDINGS: There is diffuse interstitial infiltrate throughout the left upper lobe. The left lower lobe is clear. The right lung is clear. There may be a trace of pleural fluid on the left. The heart and pulmonary vascularity are normal. There is calcification in the wall of the aortic arch. The bony thorax exhibits no acute abnormality. There are large anterior bridging osteophytes in the in the mid and lower thoracic spine. IMPRESSION: Interstitial infiltrate consistent with pneumonia throughout the left upper lobe. Followup PA and lateral chest X-ray is recommended in 3-4 weeks following trial of antibiotic therapy to ensure resolution and exclude underlying malignancy. Thoracic aortic atherosclerosis. Electronically Signed   By: David  Martinique M.D.   On: 06/02/2017 08:07   US Abdomen Limited Ruq  Result Date: 06/02/2017 CLINICAL DATA:  Right upper quadrant pain EXAM: ULTRASOUND ABDOMEN LIMITED RIGHT UPPER QUADRANT COMPARISON:  None. FINDINGS: Gallbladder: No  gallstones or wall thickening visualized. No sonographic Murphy sign noted by sonographer. A gallbladder polyp is noted measuring 1.4 mm. Common bile duct: Diameter: 4.6 mm Liver: Increased liver parenchymal echogenicity is identified. No focal liver abnormality. Portal vein is patent on color Doppler imaging with normal direction of blood flow towards the liver. IMPRESSION: 1. Hepatic steatosis. 2. Tiny gallbladder polyp Electronically Signed   By: Kerby Moors M.D.   On: 06/02/2017 12:26    Procedures Procedures (including critical care time)  Medications Ordered in ED Medications  sodium chloride 0.9 % bolus 1,000 mL (1,000 mLs Intravenous New Bag/Given 06/02/17 1237)  doxycycline (VIBRA-TABS) tablet 100 mg (100 mg Oral Given 06/02/17 1237)  albuterol (PROVENTIL HFA;VENTOLIN HFA) 108 (90 Base) MCG/ACT inhaler 2 puff (2 puffs Inhalation Given 06/02/17 1237)     Initial Impression / Assessment and Plan / ED Course  I have reviewed the triage vital signs and the nursing notes.  Pertinent labs & imaging results that were available during my care of the patient were reviewed by me and considered in my medical decision making (see chart for details).     Patient feels well. He has no abdominal pain. He does not seem to been using too much Tylenol. He does not drink alcohol. Unclear why he is having mild LFT elevation but it is improved compared to yesterday. Ultrasound unremarkable besides mild steatosis. Given no abdominal pain or other concerning signs or symptoms I don't think further imaging is warranted. His chest x-ray from yesterday is concerning for pneumonia and combined with his shortness of breath, cough, fever, I think this explains his symptoms. He will be given doxycycline after discussion with pharmacy about the lowest risk for abnormal LFTs to treat community acquired pneumonia. He appears stable for discharge with no increased work of breathing and saturations 93% and above.  Discussed strict return precautions.   Final Clinical Impressions(s) / ED Diagnoses   Final diagnoses:  Abnormal LFTs  Community acquired pneumonia of left lung, unspecified part of lung    New Prescriptions New Prescriptions   DOXYCYCLINE (VIBRAMYCIN) 100 MG CAPSULE    Take 1 capsule (100 mg total) by mouth 2 (two) times daily. One po bid x 7  days     Sherwood Gambler, MD 06/02/17 [phone]

## 2017-06-02 NOTE — ED Notes (Signed)
US at bedside

## 2017-06-03 LAB — HEPATITIS PANEL, ACUTE
HCV Ab: <0.1 s/co ratio (ref 0.0–0.9)
HEP A IGM: NEGATIVE
Hep B C IgM: NEGATIVE
Hepatitis B Surface Ag: NEGATIVE

## 2017-06-29 ENCOUNTER — Encounter: Payer: Self-pay | Admitting: Internal Medicine

## 2017-06-29 ENCOUNTER — Ambulatory Visit (INDEPENDENT_AMBULATORY_CARE_PROVIDER_SITE_OTHER): Payer: Medicare Other | Admitting: Internal Medicine

## 2017-06-29 VITALS — BP 148/80 | HR 92 | Temp 98.4°F | Ht 69.0 in | Wt 195.6 lb

## 2017-06-29 DIAGNOSIS — N183 Chronic kidney disease, stage 3 unspecified: Secondary | ICD-10-CM

## 2017-06-29 DIAGNOSIS — C9190 Lymphoid leukemia, unspecified not having achieved remission: Secondary | ICD-10-CM | POA: Diagnosis not present

## 2017-06-29 DIAGNOSIS — C9110 Chronic lymphocytic leukemia of B-cell type not having achieved remission: Secondary | ICD-10-CM

## 2017-06-29 NOTE — Progress Notes (Signed)
Subjective:    Patient ID: Samuel Stephens, male    DOB: Jan 27, 1941, 76 y.o.   MRN: [id number]  HPI  76 year old patient who is seen today following a recent ED visit.  He was seen here one day prior with fever and chills and chest x-ray revealed a community-acquired pneumonia.  Laboratory studies also revealed some elevated liver function studies and he was referred to the ED for consideration of hospital admission for pneumonia and possible sepsis syndrome.  On arrival to the ER, he was afebrile.  Liver function studies were improved and it was elected to treat with doxycycline as an outpatient.  The patient did quite well.  He has some mild residual weakness.  It seems to be improving daily.  No fever or chills.  He does have a history of CLL and is scheduled for oncology follow-up next month  He also has a history of chronic kidney disease, dyslipidemia.  Past Medical History:  Diagnosis Date  . CKD (chronic kidney disease)   . CLL (chronic lymphoblastic leukemia) 2002  . 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 Glucose intolerance (impaired glucose tolerance)   . Hyperlipidemia   . Pedal edema      Social History   Social History  . Marital status: Married    Spouse name: N/A  . Number of children: 3  . Years of education: masters   Occupational History  . retired Retired   Social History Main Topics  . Smoking status: Never Smoker  . Smokeless tobacco: Never Used  . Alcohol use 0.0 oz/week     Comment: occassional/ a beer with every meal  . Drug use: No  . Sexual activity: Yes   Other Topics Concern  . Not on file   Social History Narrative  . No narrative on file    Past Surgical History:  Procedure Laterality Date  . COLONOSCOPY W/ BIOPSIES     multiple  . TEE WITHOUT CARDIOVERSION  09/16/2012   Procedure: TRANSESOPHAGEAL ECHOCARDIOGRAM (TEE);  Surgeon: Larey Dresser, MD;  Location: [facility] ENDOSCOPY;  Service: Cardiovascular;  Laterality: N/A;     Family History  Problem Relation Age of Onset  . Stroke Mother   . Uterine cancer Mother   . Thyroid cancer Mother   . Stroke Father   . Diabetes Father   . Colon cancer Neg Hx   . Stomach cancer Neg Hx     No Known Allergies  Current Outpatient Prescriptions on File Prior to Visit  Medication Sig Dispense Refill  . clopidogrel (PLAVIX) 75 MG tablet TAKE 1 TABLET BY MOUTH EVERY DAY WITH BREAKFAST 90 tablet 1  . Omega-3 Fatty Acids (FISH OIL) 1200 MG CAPS Take 1,200 mg by mouth every evening.    . simvastatin (ZOCOR) 40 MG tablet TAKE 1 TABLET (40 MG TOTAL) BY MOUTH AT BEDTIME. 90 tablet 1   No current facility-administered medications on file prior to visit.     BP (!) 148/80 (BP Location: Left Arm, Patient Position: Sitting, Cuff Size: Normal)   Pulse 92   Temp 98.4 F (36.9 C) (Oral)   Ht 5\' 9"  (1.753 m)   Wt 195 lb 9.6 oz (88.7 kg)   SpO2 98%   BMI 28.89 kg/m     Review of Systems  Constitutional: Positive for fatigue. Negative for appetite change, chills and fever.  HENT: Negative for congestion, dental problem, ear pain, hearing loss, sore throat, tinnitus, trouble swallowing and  voice change.   Eyes: Negative for pain, discharge and visual disturbance.  Respiratory: Negative for cough, chest tightness, wheezing and stridor.   Cardiovascular: Negative for chest pain, palpitations and leg swelling.  Gastrointestinal: Negative for abdominal distention, abdominal pain, blood in stool, constipation, diarrhea, nausea and vomiting.  Genitourinary: Negative for difficulty urinating, discharge, flank pain, genital sores, hematuria and urgency.  Musculoskeletal: Negative for arthralgias, back pain, gait problem, joint swelling, myalgias and neck stiffness.  Skin: Negative for rash.  Neurological: Negative for dizziness, syncope, speech difficulty, weakness, numbness and headaches.  Hematological: Negative for adenopathy. Does not bruise/bleed easily.   Psychiatric/Behavioral: Negative for behavioral problems and dysphoric mood. The patient is not nervous/anxious.        Objective:   Physical Exam  Constitutional: He is oriented to person, place, and time. He appears well-developed.  HENT:  Head: Normocephalic.  Right Ear: External ear normal.  Left Ear: External ear normal.  Eyes: Conjunctivae and EOM are normal.  Neck: Normal range of motion.  Cardiovascular: Normal rate and normal heart sounds.   Pulmonary/Chest: Breath sounds normal. No respiratory distress. He has no rales.  Chest is clear.  O2 sat ration 98%  Abdominal: Bowel sounds are normal.  Musculoskeletal: Normal range of motion. He exhibits edema. He exhibits no tenderness.   Lower extremity edema, right greater than left  Neurological: He is alert and oriented to person, place, and time.  Psychiatric: He has a normal mood and affect. His behavior is normal.          Assessment & Plan:   Status post community acquired pneumonia Essential hypertension, stable CLL.  Follow-up oncology History of impaired glucose tolerance  No change in medical regimen CPX as scheduled summer 2019  [facility]

## 2017-06-29 NOTE — Patient Instructions (Addendum)
Follow with Dr. Alen Blew as scheduled  Return in July for your annual exam

## 2017-07-17 ENCOUNTER — Telehealth: Payer: Self-pay | Admitting: Oncology

## 2017-07-17 ENCOUNTER — Other Ambulatory Visit (HOSPITAL_BASED_OUTPATIENT_CLINIC_OR_DEPARTMENT_OTHER): Payer: Medicare Other

## 2017-07-17 ENCOUNTER — Ambulatory Visit (HOSPITAL_BASED_OUTPATIENT_CLINIC_OR_DEPARTMENT_OTHER): Payer: Medicare Other | Admitting: Oncology

## 2017-07-17 VITALS — BP 112/95 | HR 79 | Temp 98.1°F | Resp 18 | Ht 69.0 in | Wt 199.8 lb

## 2017-07-17 DIAGNOSIS — C9110 Chronic lymphocytic leukemia of B-cell type not having achieved remission: Secondary | ICD-10-CM

## 2017-07-17 DIAGNOSIS — R609 Edema, unspecified: Secondary | ICD-10-CM | POA: Diagnosis not present

## 2017-07-17 DIAGNOSIS — Z8673 Personal history of transient ischemic attack (TIA), and cerebral infarction without residual deficits: Secondary | ICD-10-CM

## 2017-07-17 LAB — CBC WITH DIFFERENTIAL/PLATELET
BASO%: 0.1 % (ref 0.0–2.0)
BASOS ABS: 0 10*3/uL (ref 0.0–0.1)
EOS ABS: 0.1 10*3/uL (ref 0.0–0.5)
EOS%: 0.7 % (ref 0.0–7.0)
HCT: 43.4 % (ref 38.4–49.9)
HGB: 14.6 g/dL (ref 13.0–17.1)
LYMPH#: 9.6 10*3/uL — AB (ref 0.9–3.3)
LYMPH%: 68.7 % — ABNORMAL HIGH (ref 14.0–49.0)
MCH: 28.9 pg (ref 27.2–33.4)
MCHC: 33.6 g/dL (ref 32.0–36.0)
MCV: 85.8 fL (ref 79.3–98.0)
MONO#: 0.5 10*3/uL (ref 0.1–0.9)
MONO%: 3.2 % (ref 0.0–14.0)
NEUT#: 3.8 10*3/uL (ref 1.5–6.5)
NEUT%: 27.3 % — ABNORMAL LOW (ref 39.0–75.0)
NRBC: 0 % (ref 0–0)
PLATELETS: 154 10*3/uL (ref 140–400)
RBC: 5.06 10*6/uL (ref 4.20–5.82)
RDW: 14.2 % (ref 11.0–14.6)
WBC: 13.9 10*3/uL — ABNORMAL HIGH (ref 4.0–10.3)

## 2017-07-17 LAB — COMPREHENSIVE METABOLIC PANEL
ALT: 16 U/L (ref 0–55)
ANION GAP: 9 meq/L (ref 3–11)
AST: 16 U/L (ref 5–34)
Albumin: 3.7 g/dL (ref 3.5–5.0)
Alkaline Phosphatase: 70 U/L (ref 40–150)
BUN: 16.1 mg/dL (ref 7.0–26.0)
CHLORIDE: 110 meq/L — AB (ref 98–109)
CO2: 22 meq/L (ref 22–29)
Calcium: 9 mg/dL (ref 8.4–10.4)
Creatinine: 1.1 mg/dL (ref 0.7–1.3)
Glucose: 101 mg/dl (ref 70–140)
POTASSIUM: 4.1 meq/L (ref 3.5–5.1)
Sodium: 140 mEq/L (ref 136–145)
Total Bilirubin: 0.73 mg/dL (ref 0.20–1.20)
Total Protein: 6.6 g/dL (ref 6.4–8.3)

## 2017-07-17 NOTE — Telephone Encounter (Signed)
Scheduled appt per 11/16 los - patient did not want avs or calendar.

## 2017-07-17 NOTE — Progress Notes (Signed)
[facility] OFFICE PROGRESS NOTE 07/17/17  Samuel Lor, MD [address] [place] [address]    DIAGNOSIS: 76 year old gentleman with early stage CLL diagnosed in 2002. He presented with leukocytosis without lymphadenopathy at stage 0.  CURRENT THERAPY: Observation and surveillance.  INTERVAL HISTORY: [name] returns for a follow up visit.  Since his last visit, he was diagnosed with community-acquired pneumonia in October 2018.  He was treated as an outpatient and has fully recovered at this time.  He initially had fevers and weakness but since that time all the symptoms has resolved.  He denied any major changes in his appetite. He denied any fatigue or tiredness. He does not report any lymphadenopathy or other satiety.  He has regained all activities of daily living at this time.  He does not report any headaches, blurry vision, syncope or seizures. He does report residual neurological deficits from his stroke in 2014. He does not report any chest pain, shortness of breath or difficulty breathing. He does not report any cough or hemoptysis or wheezing. He does not report any nausea, vomiting, abdominal pain or early satiety. He does not report any frequency urgency or hesitancy. Rest of his review of systems unremarkable.  MEDICAL HISTORY: Past Medical History:  Diagnosis Date  . CKD (chronic kidney disease)   . CLL (chronic lymphoblastic leukemia) 2002  . 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 Glucose intolerance (impaired glucose tolerance)   . Hyperlipidemia   . Pedal edema    sease); CVA (cerebral infarction); and Impaired glucose tolerance on his problem list.    ALLERGIES:  has No Known Allergies.  MEDICATIONS: Samuel Stephens "Bill" had no medications administered during this visit.  PHYSICAL EXAMINATION:  ECOG PERFORMANCE STATUS: 0 - Asymptomatic  Vitals:   07/17/17 1329  BP: (!) 112/95  Pulse: 79  Resp: 18   Temp: 98.1 F (36.7 C)  SpO2: 96%   GENERAL: Alert, awake gentleman without distress. SKIN: No rashes or lesions.  Without ecchymosis or petechiae. OROPHARYNX:  No oral thrush or ulcers. NECK: no adenopathy.  LYMPH:  no palpable lymphadenopathy neck, inguinal and axillary area. LUNGS: Clear to auscultation. No wheezes or dullness to percussion. HEART: regular rate & rhythm without murmurs rubs or gallops. ABDOMEN:abdomen soft, non-tender, normal bowel sound.  No shifting dullness or ascites. BACK: no skin lesions, erythema or scars EXTREMITIES: 1+ edema.      LABORATORY DATA: CBC    Component Value Date/Time   WBC 13.9 (H) 07/17/2017 1259   WBC 13.4 (H) 06/02/2017 1232   RBC 5.06 07/17/2017 1259   RBC 4.57 06/02/2017 1232   HGB 14.6 07/17/2017 1259   HCT 43.4 07/17/2017 1259   PLT 154 07/17/2017 1259   MCV 85.8 07/17/2017 1259   MCH 28.9 07/17/2017 1259   MCH 28.4 06/02/2017 1232   MCHC 33.6 07/17/2017 1259   MCHC 34.8 06/02/2017 1232   RDW 14.2 07/17/2017 1259   LYMPHSABS 9.6 (H) 07/17/2017 1259   MONOABS 0.5 07/17/2017 1259   EOSABS 0.1 07/17/2017 1259   BASOSABS 0.0 07/17/2017 1259      ASSESSMENT AND PLAN:  76 year old gentleman with:  1. Stage 0 CLL diagnosed in 2002. He continues to be asymptomatic since his diagnosis. His laboratory testing and physical examination remain unchanged.  His CBC from 07/17/2017 showed no changes chest progression of disease.  The plan is to continue with active surveillance on an annual basis and  repeat laboratory testing in one year.  2. Lower extremity edema: He uses Lasix intermittently. These have not changed since last visit.  3. Follow-up: Will be in one year.    Zola Button MD 07/17/17

## 2017-11-11 ENCOUNTER — Other Ambulatory Visit: Payer: Self-pay | Admitting: Internal Medicine

## 2018-01-05 ENCOUNTER — Other Ambulatory Visit: Payer: Self-pay | Admitting: Internal Medicine

## 2018-02-17 ENCOUNTER — Ambulatory Visit (INDEPENDENT_AMBULATORY_CARE_PROVIDER_SITE_OTHER): Payer: Medicare Other | Admitting: Internal Medicine

## 2018-02-17 ENCOUNTER — Encounter: Payer: Self-pay | Admitting: Internal Medicine

## 2018-02-17 VITALS — BP 138/80 | HR 81 | Temp 97.9°F | Wt 196.0 lb

## 2018-02-17 DIAGNOSIS — N183 Chronic kidney disease, stage 3 unspecified: Secondary | ICD-10-CM

## 2018-02-17 DIAGNOSIS — E785 Hyperlipidemia, unspecified: Secondary | ICD-10-CM | POA: Diagnosis not present

## 2018-02-17 DIAGNOSIS — R7302 Impaired glucose tolerance (oral): Secondary | ICD-10-CM

## 2018-02-17 DIAGNOSIS — Z860100 Personal history of colon polyps, unspecified: Secondary | ICD-10-CM

## 2018-02-17 DIAGNOSIS — Z0000 Encounter for general adult medical examination without abnormal findings: Secondary | ICD-10-CM

## 2018-02-17 LAB — LIPID PANEL
CHOLESTEROL: 195 mg/dL (ref 0–200)
HDL: 52.6 mg/dL (ref >39.00)
LDL CALC: 126 mg/dL — AB (ref 0–99)
NONHDL: 142.85
Total CHOL/HDL Ratio: 4
Triglycerides: 86 mg/dL (ref 0.0–149.0)
VLDL: 17.2 mg/dL (ref 0.0–40.0)

## 2018-02-17 LAB — TSH: TSH: 2.46 u[IU]/mL (ref 0.35–4.50)

## 2018-02-17 NOTE — Patient Instructions (Signed)
It is important that you exercise regularly, at least 20 minutes 3 to 4 times per week.  If you develop chest pain or shortness of breath seek  medical attention.  Hematology follow-up as scheduled  Return in 1 year for follow-up

## 2018-02-17 NOTE — Progress Notes (Signed)
Subjective:    Patient ID: Samuel Stephens, male    DOB: March 27, 1941, 77 y.o.   MRN: [id number]  HPI  77 year old patient who is seen today for an annual health assessment and subsequent Medicare wellness visit He is followed by hematology for stable CLL.  He has chronic kidney disease and a history of dyslipidemia.  Remains on statin therapy.  He has a history of cerebrovascular disease and remains on Plavix. Also a history of colonic polyps.  Last colonoscopy was spring 2015.  Subsequent Medicare wellness visit  Past Medical History:  Diagnosis Date  . CKD (chronic kidney disease)   . CLL (chronic lymphoblastic leukemia) 2002  . 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 Glucose intolerance (impaired glucose tolerance)   . Hyperlipidemia   . Pedal edema      Social History   Socioeconomic History  . Marital status: Married    Spouse name: Not on file  . Number of children: 3  . Years of education: masters  . Highest education level: Not on file  Occupational History  . Occupation: retired    Fish farm manager: RETIRED  Social Needs  . Financial resource strain: Not on file  . Food insecurity:    Worry: Not on file    Inability: Not on file  . Transportation needs:    Medical: Not on file    Non-medical: Not on file  Tobacco Use  . Smoking status: Never Smoker  . Smokeless tobacco: Never Used  Substance and Sexual Activity  . Alcohol use: Yes    Alcohol/week: 0.0 oz    Comment: occassional/ a beer with every meal  . Drug use: No  . Sexual activity: Yes  Lifestyle  . Physical activity:    Days per week: Not on file    Minutes per session: Not on file  . Stress: Not on file  Relationships  . Social connections:    Talks on phone: Not on file    Gets together: Not on file    Attends religious service: Not on file    Active member of club or organization: Not on file    Attends meetings of clubs or organizations: Not on file    Relationship status: Not on  file  . Intimate partner violence:    Fear of current or ex partner: Not on file    Emotionally abused: Not on file    Physically abused: Not on file    Forced sexual activity: Not on file  Other Topics Concern  . Not on file  Social History Narrative  . Not on file    Past Surgical History:  Procedure Laterality Date  . COLONOSCOPY W/ BIOPSIES     multiple  . TEE WITHOUT CARDIOVERSION  09/16/2012   Procedure: TRANSESOPHAGEAL ECHOCARDIOGRAM (TEE);  Surgeon: Larey Dresser, MD;  Location: [facility] ENDOSCOPY;  Service: Cardiovascular;  Laterality: N/A;    Family History  Problem Relation Age of Onset  . Stroke Mother   . Uterine cancer Mother   . Thyroid cancer Mother   . Stroke Father   . Diabetes Father   . Colon cancer Neg Hx   . Stomach cancer Neg Hx     No Known Allergies  Current Outpatient Medications on File Prior to Visit  Medication Sig Dispense Refill  . clopidogrel (PLAVIX) 75 MG tablet TAKE 1 TABLET BY MOUTH EVERY DAY WITH BREAKFAST 90 tablet 1  . Omega-3 Fatty Acids (FISH OIL)  1200 MG CAPS Take 1,200 mg by mouth every evening.    . simvastatin (ZOCOR) 40 MG tablet TAKE 1 TABLET (40 MG TOTAL) BY MOUTH AT BEDTIME. 90 tablet 1   No current facility-administered medications on file prior to visit.     BP 138/80 (BP Location: Right Arm, Patient Position: Sitting, Cuff Size: Large)   Pulse 81   Temp 97.9 F (36.6 C) (Oral)   Wt 196 lb (88.9 kg)   SpO2 96%   BMI 28.94 kg/m   Subsequent Medicare wellness visit  1. Risk factors, based on past  M,S,F history.  Patient has a history of cerebrovascular disease.  Cardiovascular risk factors include history of impaired glucose tolerance  2.  Physical activities: Remains quite active with yard work.  His health plan dropped silver sneakers anywhere else no longer participates at a health club no exercise limitation  3.  Depression/mood: No history major depression or mood disorder  4.  Hearing: Uses hearing  aids  5.  ADL's: Independent  6.  Fall risk: Low  7.  Home safety: No problems identified  8.  Height weight, and visual acuity; height and weight stable no change in visual acuity  9.  Counseling: Continue active lifestyle and heart healthy diet  10. Lab orders based on risk factors: Laboratory update will be reviewed including lipid profile  11. Referral :  Follow-up hematology 12. Care plan: Consider follow-up colonoscopy in 1 year  13. Cognitive assessment: Alert and appropriate with normal affect.  No cognitive dysfunction  14. Screening: Patient provided with a written and personalized 5-10 year screening schedule in the AVS.    15. Provider List Update: Hematology primary care ophthalmology GI    Review of Systems  Constitutional: Negative for appetite change, chills, fatigue and fever.  HENT: Positive for hearing loss. Negative for congestion, dental problem, ear pain, sore throat, tinnitus, trouble swallowing and voice change.   Eyes: Negative for pain, discharge and visual disturbance.  Respiratory: Negative for cough, chest tightness, wheezing and stridor.   Cardiovascular: Negative for chest pain, palpitations and leg swelling.  Gastrointestinal: Negative for abdominal distention, abdominal pain, blood in stool, constipation, diarrhea, nausea and vomiting.  Genitourinary: Negative for difficulty urinating, discharge, flank pain, genital sores, hematuria and urgency.  Musculoskeletal: Negative for arthralgias, back pain, gait problem, joint swelling, myalgias and neck stiffness.  Skin: Negative for rash.  Neurological: Negative for dizziness, syncope, speech difficulty, weakness, numbness and headaches.  Hematological: Negative for adenopathy. Does not bruise/bleed easily.  Psychiatric/Behavioral: Negative for behavioral problems and dysphoric mood. The patient is not nervous/anxious.        Objective:   Physical Exam  Constitutional: He is oriented to person,  place, and time. He appears well-developed.  HENT:  Head: Normocephalic.  Right Ear: External ear normal.  Left Ear: External ear normal.  Nonocclusive cerumen in both canals.  Hearing aid on the right  Eyes: Conjunctivae and EOM are normal.  Neck: Normal range of motion.  Cardiovascular: Normal rate and normal heart sounds.  Pulmonary/Chest: Breath sounds normal.  Abdominal: Bowel sounds are normal.  Genitourinary: Rectal exam shows guaiac negative stool.  Genitourinary Comments: Prostate +2 enlarged  Musculoskeletal: Normal range of motion. He exhibits edema. He exhibits no tenderness.  +2 edema of the lower extremities  Neurological: He is alert and oriented to person, place, and time.  Psychiatric: He has a normal mood and affect. His behavior is normal.  Assessment & Plan:  Preventive health examination  Subsequent Medicare wellness visit History of chronic kidney disease.  Most recent indices revealed a GFR greater than 60 Dyslipidemia will review a lipid profile CLL stable.  Follow-up hematology History of cerebrovascular disease/TIA.  Continue Plavix BPH stable history of colonic polyps.  Consider follow-up colonoscopy in 1 year   Medications updated Hematology follow-up Follow-up 1 year  [name]

## 2018-05-31 ENCOUNTER — Telehealth: Payer: Self-pay | Admitting: Internal Medicine

## 2018-05-31 ENCOUNTER — Other Ambulatory Visit: Payer: Self-pay

## 2018-05-31 MED ORDER — FUROSEMIDE 40 MG PO TABS: 40.0000 mg | ORAL_TABLET | Freq: Every day | ORAL | 3 refills | Status: DC

## 2018-05-31 MED ORDER — SIMVASTATIN 40 MG PO TABS: ORAL_TABLET | ORAL | 1 refills | Status: DC

## 2018-05-31 MED ORDER — CLOPIDOGREL BISULFATE 75 MG PO TABS: ORAL_TABLET | ORAL | 1 refills | Status: DC

## 2018-05-31 NOTE — Telephone Encounter (Signed)
Copied from [id number]. Topic: Quick Communication - Rx Refill/Question >> May 31, 2018 11:41 AM Oliver Pila B wrote: Medication: furosemide (LASIX) 40 MG tablet [[id number]]  DISCONTINUED  simvastatin (ZOCOR) 40 MG tablet [[id number]]  clopidogrel (PLAVIX) 75 MG tablet [[id number]]   Has the patient contacted their pharmacy? Yes.   (Agent: If no, request that the patient contact the pharmacy for the refill.) (Agent: If yes, when and what did the pharmacy advise?)  Preferred Pharmacy (with phone number or street name): [facility]  Agent: Please be advised that RX refills may take up to 3 business days. We ask that you follow-up with your pharmacy.

## 2018-05-31 NOTE — Telephone Encounter (Signed)
Pt aware to follow up with new PCP for further refills due to Dr.Kwiakowski's retiring. Rx filled for 90 days.

## 2018-05-31 NOTE — Telephone Encounter (Signed)
Pt Furosemide has been refilled. Wasn't on recent med list. However, pt was on this Rx no document stated pt was suppose to stop this Rx. Pt stated that he is till on it.

## 2018-07-19 NOTE — Progress Notes (Signed)
Established Patient Office Visit     CC/Reason for Visit: Establish care with me in follow-up on chronic medical conditions  HPI: Samuel Stephens is a 77 y.o. male who is coming in today for the above mentioned reasons. 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 Last seen for his Medicare wellness visit in June 2019.  He has no acute complaints today, denies chest pain, shortness of breath, has minimal lower extremity edema but this is no different than his normal.  Denies orthopnea or dyspnea on exertion.  Saw his oncologist today for follow-up of his CLL and was told that everything is stable.  Had blood work done today at the oncology office.   Past Medical/Surgical History: Past Medical History:  Diagnosis Date  . CKD (chronic kidney disease)   . CLL (chronic lymphoblastic leukemia) 2002  . 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 Glucose intolerance (impaired glucose tolerance)   . Hyperlipidemia   . Pedal edema     Past Surgical History:  Procedure Laterality Date  . COLONOSCOPY W/ BIOPSIES     multiple  . TEE WITHOUT CARDIOVERSION  09/16/2012   Procedure: TRANSESOPHAGEAL ECHOCARDIOGRAM (TEE);  Surgeon: Larey Dresser, MD;  Location: [facility];  Service: Cardiovascular;  Laterality: N/A;   Social History:  reports that he has never smoked. He has never used smokeless tobacco. He reports that he drinks alcohol. He reports that he does not use drugs.  Allergies: No Known Allergies  Family History:  Family History  Problem Relation Age of Onset  . Stroke Mother   . Uterine cancer Mother   . Thyroid cancer Mother   . Stroke Father   . Diabetes Father   . Colon cancer Neg Hx   . Stomach cancer Neg Hx      Current Outpatient Medications:  .  clopidogrel (PLAVIX) 75 MG tablet, TAKE 1 TABLET BY MOUTH EVERY DAY WITH BREAKFAST, Disp: 90 tablet, Rfl: 1 .  furosemide  (LASIX) 40 MG tablet, Take 1 tablet (40 mg total) by mouth daily., Disp: 30 tablet, Rfl: 3 .  Omega-3 Fatty Acids (FISH OIL) 1200 MG CAPS, Take 1,200 mg by mouth every evening., Disp: , Rfl:  .  simvastatin (ZOCOR) 40 MG tablet, TAKE 1 TABLET (40 MG TOTAL) BY MOUTH AT BEDTIME., Disp: 90 tablet, Rfl: 1  Review of Systems:  Constitutional: Denies fever, chills, diaphoresis, appetite change and fatigue.  HEENT: Denies photophobia, eye pain, redness, hearing loss, ear pain, congestion, sore throat, rhinorrhea, sneezing, mouth sores, trouble swallowing, neck pain, neck stiffness and tinnitus.   Respiratory: Denies SOB, DOE, cough, chest tightness,  and wheezing.   Cardiovascular: Denies chest pain, palpitations and leg swelling.  Gastrointestinal: Denies nausea, vomiting, abdominal pain, diarrhea, constipation, blood in stool and abdominal distention.  Genitourinary: Denies dysuria, urgency, frequency, hematuria, flank pain and difficulty urinating.  Endocrine: Denies: hot or cold intolerance, sweats, changes in hair or nails, polyuria, polydipsia. Musculoskeletal: Denies myalgias, back pain, joint swelling, arthralgias and gait problem.  Skin: Denies pallor, rash and wound.  Neurological: Denies dizziness, seizures, syncope, weakness, light-headedness, numbness and headaches.  Hematological: Denies adenopathy. Easy bruising, personal or family bleeding history  Psychiatric/Behavioral: Denies suicidal ideation, mood changes, confusion, nervousness, sleep disturbance and agitation    Physical Exam: Vitals:   07/20/18 1611  BP: 110/70  Pulse: 79  Temp: 97.7 F (36.5 C)  TempSrc: Oral  SpO2: 97%  Weight: 197 lb 11.2 oz (89.7 kg)    Body mass index is 29.2 kg/m.   Constitutional: NAD, calm, comfortable Eyes: PERRL, lids and conjunctivae normal ENMT: Mucous membranes are moist. Posterior pharynx clear of any exudate or lesions. Normal dentition.  Neck: normal, supple, no masses, no  thyromegaly Respiratory: clear to auscultation bilaterally, no wheezing, no crackles. Normal respiratory effort. No accessory muscle use.  Cardiovascular: Regular rate and rhythm, no murmurs / rubs / gallops.  Trace bilateral pitting edema bilaterally. 2+ pedal pulses. No carotid bruits.  Abdomen: no tenderness, no masses palpated. No hepatosplenomegaly. Bowel sounds positive.  Musculoskeletal: no clubbing / cyanosis. No joint deformity upper and lower extremities. Good ROM, no contractures. Normal muscle tone.  Skin: no rashes, lesions, ulcers. No induration Neurologic: CN 2-12 grossly intact. Sensation intact, DTR normal. Strength 5/5 in all 4.  Psychiatric: Normal judgment and insight. Alert and oriented x 3. Normal mood.    Impression and Plan:  Chronic lymphocytic leukemia ([place]) -Followed by Dr. Alen Blew.  Last seen 07/20/2018 -Stable. Followed q 1 year.  Dyslipidemia -On simvastatin 40, fish oil 1200 -LDL 126 in 6/19.  Benign prostatic hyperplasia without lower urinary tract symptoms  Stage 3 chronic kidney disease ([place]) -Creatinine trend is as follows: 1.2 in 2016, 1.2-1.4 in 2017, 1.1 in 2018, 1.41 in November 2019. -He has Lasix on his medication profile, however states he uses this very infrequently for lower extremity edema and has not had any doses in the last week. -As he has not seen a nephrologist before and has at least stage II-III chronic kidney disease, I believe it is important for him to establish care with nephrology.  Will refer today.  Impaired glucose tolerance -Discussed diet and lifestyle modifications. -Will request A1c at next visit.     Patient Instructions   -We placed a referral for you as discussed with the nephrologist for discussion of your chronic kidney disease. It usually takes about 1-2 weeks to process and schedule this referral. If you have not heard from [place] regarding this appointment in 2 weeks please contact our office.  -Please follow-up  in 4 to 5 months.      Lelon Frohlich, MD [name]

## 2018-07-20 ENCOUNTER — Inpatient Hospital Stay: Payer: Medicare Other | Attending: Oncology | Admitting: Oncology

## 2018-07-20 ENCOUNTER — Inpatient Hospital Stay: Payer: Medicare Other

## 2018-07-20 ENCOUNTER — Encounter: Payer: Self-pay | Admitting: Internal Medicine

## 2018-07-20 ENCOUNTER — Ambulatory Visit (INDEPENDENT_AMBULATORY_CARE_PROVIDER_SITE_OTHER): Payer: Medicare Other | Admitting: Internal Medicine

## 2018-07-20 ENCOUNTER — Telehealth: Payer: Self-pay | Admitting: Oncology

## 2018-07-20 VITALS — BP 110/70 | HR 79 | Temp 97.7°F | Wt 197.7 lb

## 2018-07-20 VITALS — BP 143/88 | HR 92 | Temp 98.1°F | Resp 17 | Wt 196.5 lb

## 2018-07-20 DIAGNOSIS — N400 Enlarged prostate without lower urinary tract symptoms: Secondary | ICD-10-CM

## 2018-07-20 DIAGNOSIS — N183 Chronic kidney disease, stage 3 unspecified: Secondary | ICD-10-CM

## 2018-07-20 DIAGNOSIS — E785 Hyperlipidemia, unspecified: Secondary | ICD-10-CM

## 2018-07-20 DIAGNOSIS — R7302 Impaired glucose tolerance (oral): Secondary | ICD-10-CM

## 2018-07-20 DIAGNOSIS — C9110 Chronic lymphocytic leukemia of B-cell type not having achieved remission: Secondary | ICD-10-CM

## 2018-07-20 LAB — CBC WITH DIFFERENTIAL/PLATELET
Abs Immature Granulocytes: 0.04 10*3/uL (ref 0.00–0.07)
BASOS ABS: 0.1 10*3/uL (ref 0.0–0.1)
BASOS PCT: 0 %
EOS ABS: 0.1 10*3/uL (ref 0.0–0.5)
EOS PCT: 1 %
HCT: 47.7 % (ref 39.0–52.0)
Hemoglobin: 15.9 g/dL (ref 13.0–17.0)
Immature Granulocytes: 0 %
Lymphocytes Relative: 67 %
Lymphs Abs: 11.2 10*3/uL — ABNORMAL HIGH (ref 0.7–4.0)
MCH: 28.8 pg (ref 26.0–34.0)
MCHC: 33.3 g/dL (ref 30.0–36.0)
MCV: 86.4 fL (ref 80.0–100.0)
Monocytes Absolute: 1 10*3/uL (ref 0.1–1.0)
Monocytes Relative: 6 %
NEUTROS PCT: 26 %
Neutro Abs: 4.4 10*3/uL (ref 1.7–7.7)
PLATELETS: 170 10*3/uL (ref 150–400)
RBC: 5.52 MIL/uL (ref 4.22–5.81)
RDW: 12.7 % (ref 11.5–15.5)
WBC: 16.7 10*3/uL — AB (ref 4.0–10.5)
nRBC: 0 % (ref 0.0–0.2)

## 2018-07-20 LAB — COMPREHENSIVE METABOLIC PANEL
ALK PHOS: 75 U/L (ref 38–126)
ALT: 19 U/L (ref 0–44)
ANION GAP: 10 (ref 5–15)
AST: 20 U/L (ref 15–41)
Albumin: 3.9 g/dL (ref 3.5–5.0)
BILIRUBIN TOTAL: 0.9 mg/dL (ref 0.3–1.2)
BUN: 18 mg/dL (ref 8–23)
CO2: 23 mmol/L (ref 22–32)
CREATININE: 1.41 mg/dL — AB (ref 0.61–1.24)
Calcium: 8.9 mg/dL (ref 8.9–10.3)
Chloride: 109 mmol/L (ref 98–111)
GFR, EST AFRICAN AMERICAN: 54 mL/min — AB (ref >60)
GFR, EST NON AFRICAN AMERICAN: 47 mL/min — AB (ref >60)
Glucose, Bld: 110 mg/dL — ABNORMAL HIGH (ref 70–99)
Potassium: 4.5 mmol/L (ref 3.5–5.1)
SODIUM: 142 mmol/L (ref 135–145)
TOTAL PROTEIN: 6.7 g/dL (ref 6.5–8.1)

## 2018-07-20 NOTE — Progress Notes (Signed)
[facility] OFFICE PROGRESS NOTE 07/20/18  [name], MD [address] [place] [address]    DIAGNOSIS: 77 year old man with CLL diagnosed in 2002.  He was found to have leukocytosis without any lymphadenopathy.  CURRENT THERAPY: Active surveillance.  INTERVAL HISTORY: Samuel Stephens is here for a repeat evaluation.  Since the last visit, he reports no major changes in his health.  He continues to enjoy excellent quality of life without any recent complaints.  He denies any recurrent infections or excessive fatigue.  He denies any painful adenopathy or recent hospitalizations.  He is quality of life and performance status is unchanged.  He does not report any headaches, blurry vision, syncope or seizures.  He denies any alteration in mental status or confusion.  He denies any fevers, chills or sweats.  He does not report any chest pain, shortness of breath or difficulty breathing. He does not report any cough or hemoptysis or wheezing. He does not report any nausea, vomiting, abdominal pain or abdominal pain.  He denies any changes in bowel habits.  He denies any ecchymosis petechiae.  He denies any lymphadenopathy or easy bruising. He does not report any frequency urgency or hesitancy. Rest of his review of his negative.  MEDICAL HISTORY: Past Medical History:  Diagnosis Date  . CKD (chronic kidney disease)   . CLL (chronic lymphoblastic leukemia) 2002  . 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 Glucose intolerance (impaired glucose tolerance)   . Hyperlipidemia   . Pedal edema    sease); CVA (cerebral infarction); and Impaired glucose tolerance on his problem list.    ALLERGIES:  has No Known Allergies.  MEDICATIONS: Samuel Labrador "Bill" had no medications administered during this visit.  PHYSICAL EXAMINATION: Blood pressure (!) 143/88, pulse 92, temperature 98.1 F (36.7 C), temperature source Oral, resp. rate 17, weight 196 lb  8 oz (89.1 kg), SpO2 97 %.   ECOG PERFORMANCE STATUS: 0 - Asymptomatic   General appearance: Comfortable appearing without any discomfort Head: Normocephalic without any trauma Oropharynx: Mucous membranes are moist and pink without any thrush or ulcers. Eyes: Pupils are equal and round reactive to light. Lymph nodes: No cervical, supraclavicular, inguinal or axillary lymphadenopathy.   Heart:regular rate and rhythm.  S1 and S2 without leg edema. Lung: Clear without any rhonchi or wheezes.  No dullness to percussion. Abdomin: Soft, nontender, nondistended with good bowel sounds.  No hepatosplenomegaly. Musculoskeletal: No joint deformity or effusion.  Full range of motion noted. Neurological: No deficits noted on motor, sensory and deep tendon reflex exam. Skin: No petechial rash or dryness.  Appeared moist.  Psychiatric: Mood and affect appeared appropriate.       LABORATORY DATA: CBC    Component Value Date/Time   WBC 13.9 (H) 07/17/2017 1259   WBC 13.4 (H) 06/02/2017 1232   RBC 5.06 07/17/2017 1259   RBC 4.57 06/02/2017 1232   HGB 14.6 07/17/2017 1259   HCT 43.4 07/17/2017 1259   PLT 154 07/17/2017 1259   MCV 85.8 07/17/2017 1259   MCH 28.9 07/17/2017 1259   MCH 28.4 06/02/2017 1232   MCHC 33.6 07/17/2017 1259   MCHC 34.8 06/02/2017 1232   RDW 14.2 07/17/2017 1259   LYMPHSABS 9.6 (H) 07/17/2017 1259   MONOABS 0.5 07/17/2017 1259   EOSABS 0.1 07/17/2017 1259   BASOSABS 0.0 07/17/2017 1259      ASSESSMENT AND PLAN:  77 year old gentleman with:  1.  CLL  diagnosed in 2002 after presenting with lymphocytosis.  He has been on active surveillance since that time without any indication for treatment.  The natural course of this disease was reviewed today with the patient and indication for treatments were discussed.  His laboratory data from today showed mild leukocytosis without any constitutional symptoms.  At this time unless he develops painful adenopathy,  cytopenias, recurrent infections or other constitutional symptoms there be no indication for treatment.  2.  Autoimmune cytopenias: No evidence to show autoimmune process at this time.  3. Follow-up: Will be in one year.   15  minutes was spent with the patient face-to-face today.  More than 50% of time was dedicated to reviewing his disease process, indication for treatment and coordinating plan of care.   Zola Button MD 07/20/18

## 2018-07-20 NOTE — Patient Instructions (Signed)
-  We placed a referral for you as discussed with the nephrologist for discussion of your chronic kidney disease. It usually takes about 1-2 weeks to process and schedule this referral. If you have not heard from [place] regarding this appointment in 2 weeks please contact our office.  -Please follow-up in 4 to 5 months.

## 2018-07-20 NOTE — Telephone Encounter (Signed)
Gave pt avs and calendar  °

## 2018-07-27 ENCOUNTER — Encounter: Payer: Self-pay | Admitting: Internal Medicine

## 2018-08-11 ENCOUNTER — Other Ambulatory Visit: Payer: Self-pay | Admitting: *Deleted

## 2018-08-11 MED ORDER — FUROSEMIDE 40 MG PO TABS: 40.0000 mg | ORAL_TABLET | Freq: Every day | ORAL | 1 refills | Status: DC

## 2019-01-19 ENCOUNTER — Encounter: Payer: Self-pay | Admitting: Internal Medicine

## 2019-02-06 IMAGING — US US ABDOMEN LIMITED
1 series · 14 of 25 positions shown · non-contrast
Comparison: None.

CLINICAL DATA: Right upper quadrant pain

EXAM:
ULTRASOUND ABDOMEN LIMITED RIGHT UPPER QUADRANT

[Series 1: us abdomen limited · 0.26mm/px · 14 of 51 slices shown]
[im 1/51]
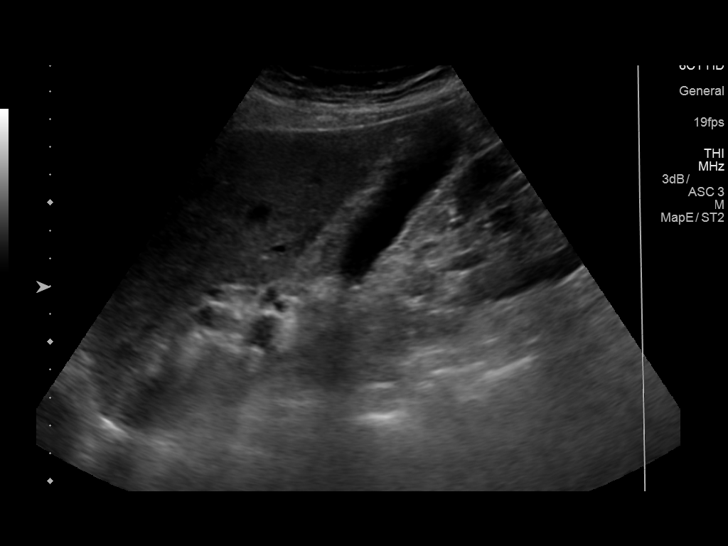
[im 5/51]
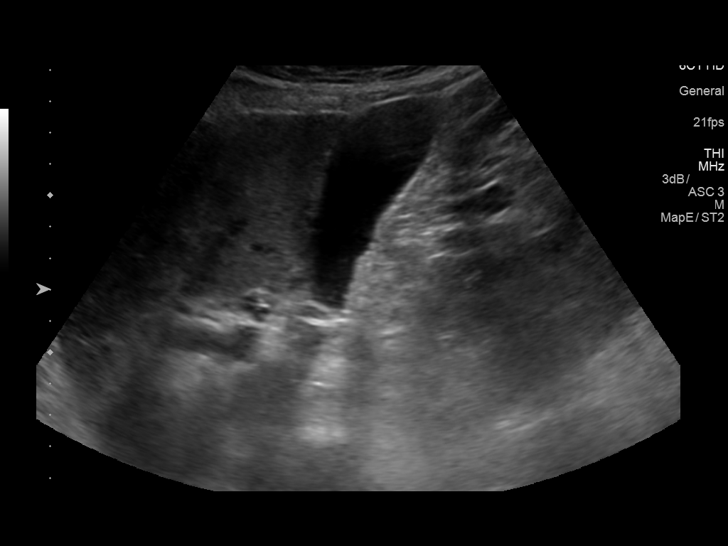
[im 9/51]
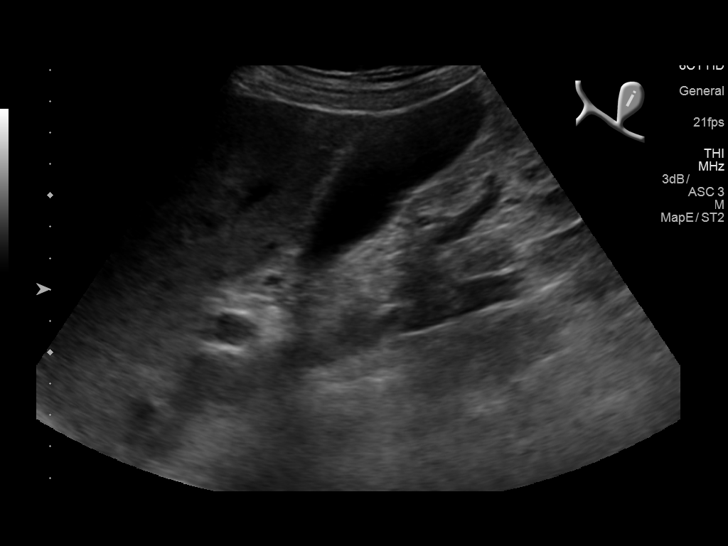
[im 13/51]
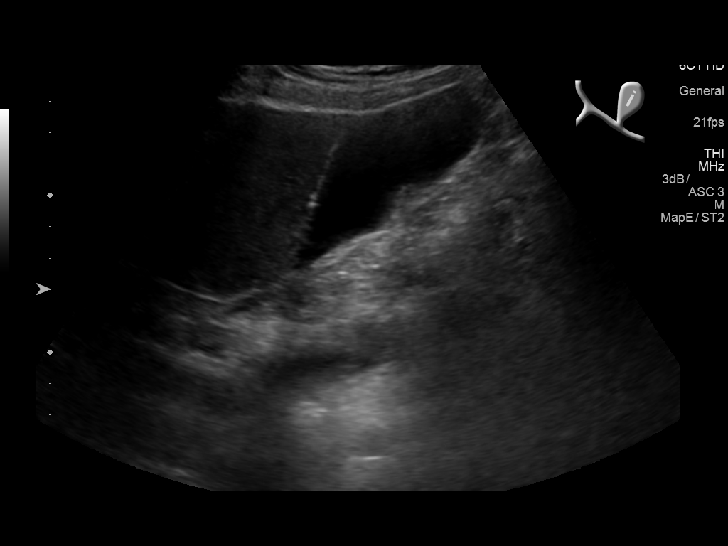
[im 17/51]
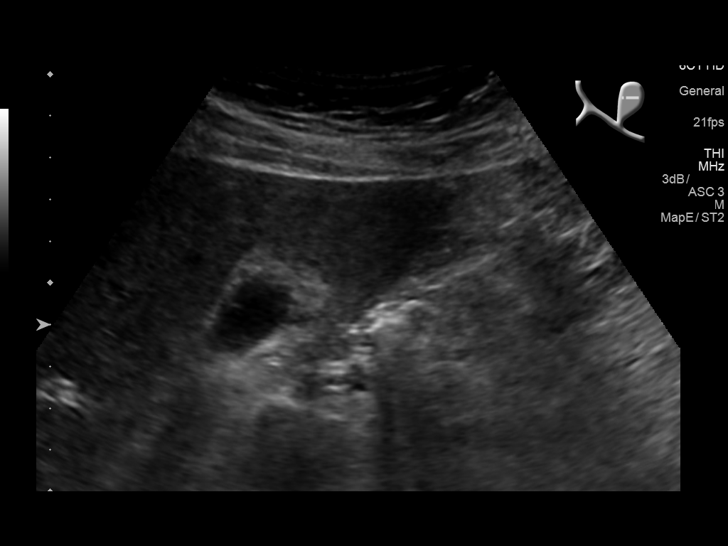
[im 19/51]
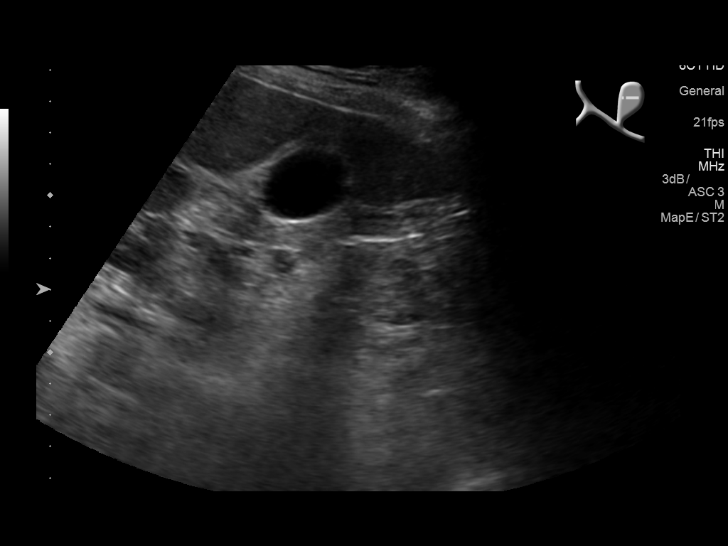
[im 23/51]
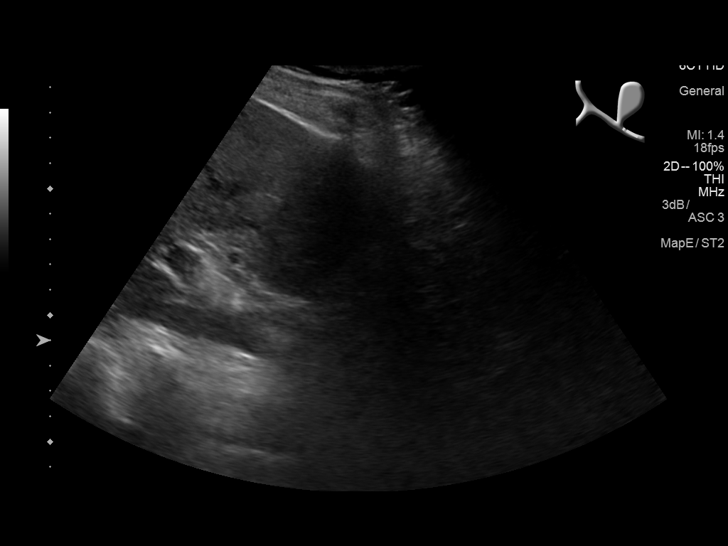
[im 28/51]
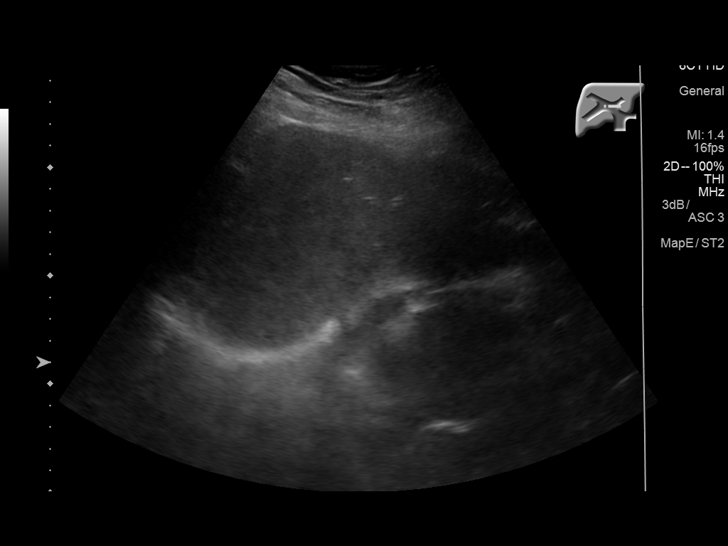
[im 32/51]
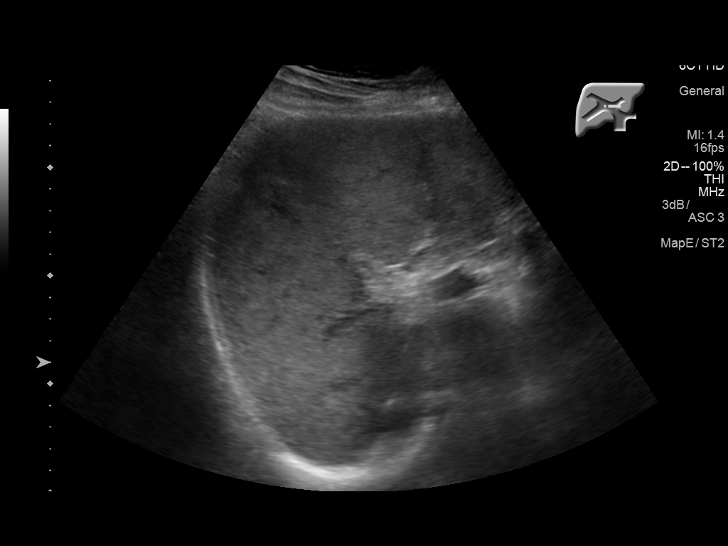
[im 34/51]
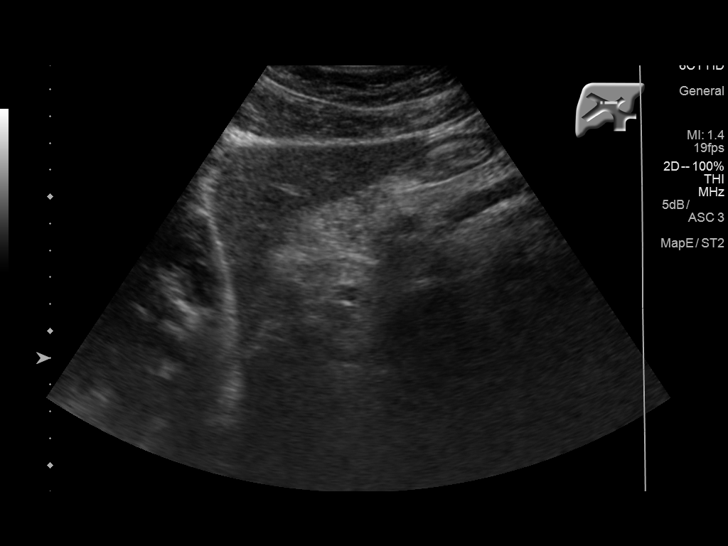
[im 38/51]
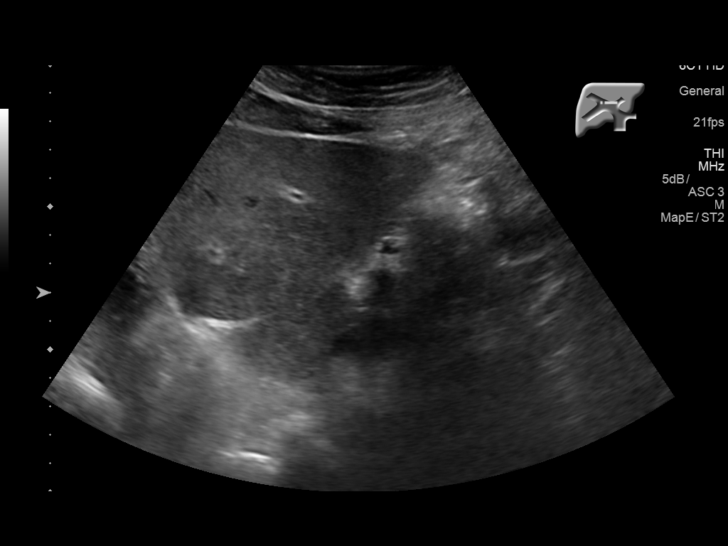
[im 42/51]
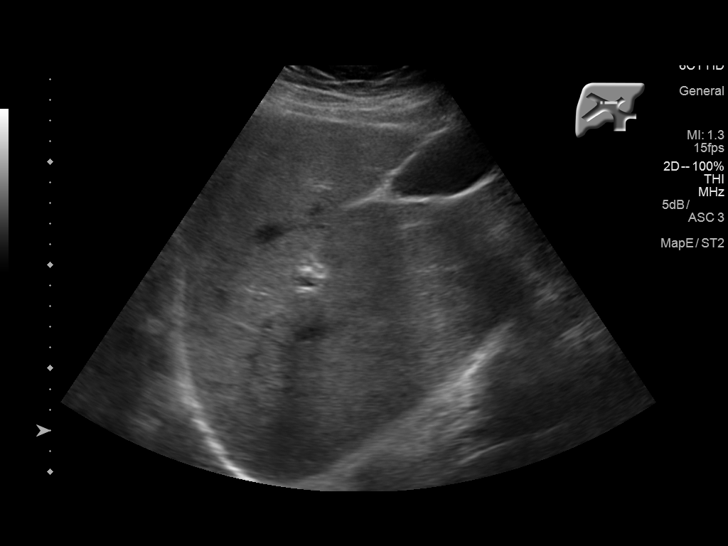
[im 46/51]
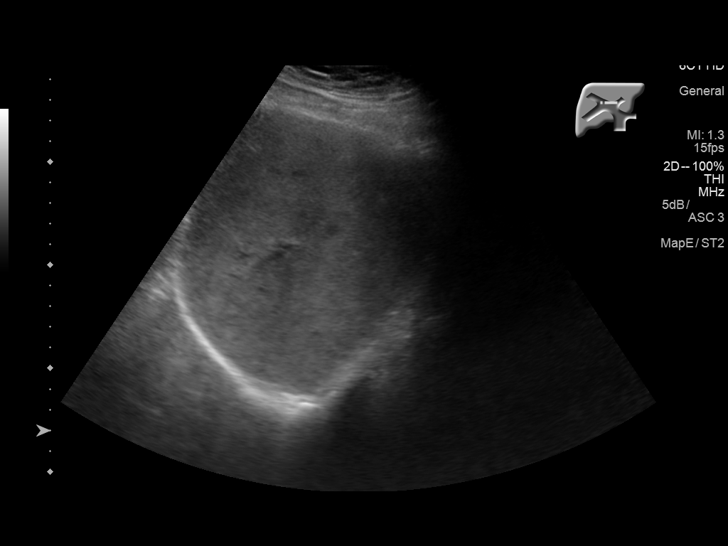
[im 51/51]
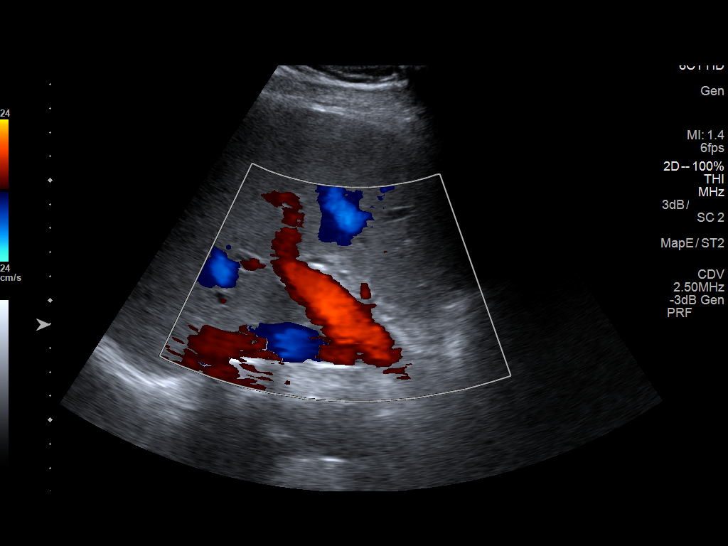

[14 of 25 positions shown; findings below may reference images not displayed]

FINDINGS: Gallbladder:

No gallstones or wall thickening visualized. No sonographic Murphy
sign noted by sonographer. A gallbladder polyp is noted measuring
1.4 mm.

Common bile duct:

Diameter: 4.6 mm

Liver:

Increased liver parenchymal echogenicity is identified. No focal
liver abnormality. Portal vein is patent on color Doppler imaging
with normal direction of blood flow towards the liver.
IMPRESSION: 1. Hepatic steatosis.
2. Tiny gallbladder polyp

## 2019-03-17 ENCOUNTER — Telehealth: Payer: Self-pay | Admitting: Internal Medicine

## 2019-03-17 NOTE — Telephone Encounter (Signed)
Medication Refill - Medication: clopidogrel (PLAVIX) 75 MG tablet  Has the patient contacted their pharmacy? Yes - they sent a request on 07/06 (I don't see in Epic).  Pt is now out of medication. (Agent: If no, request that the patient contact the pharmacy for the refill.) (Agent: If yes, when and what did the pharmacy advise?)  Preferred Pharmacy (with phone number or street name):  [id number] [address], [place] [phone] (Phone) [phone] (Fax)     Agent: Please be advised that RX refills may take up to 3 business days. We ask that you follow-up with your pharmacy.

## 2019-03-18 MED ORDER — CLOPIDOGREL BISULFATE 75 MG PO TABS: ORAL_TABLET | ORAL | 1 refills | Status: DC

## 2019-03-18 NOTE — Telephone Encounter (Signed)
Refill sent.

## 2019-06-23 ENCOUNTER — Encounter: Payer: Self-pay | Admitting: Internal Medicine

## 2019-07-19 ENCOUNTER — Other Ambulatory Visit: Payer: Self-pay

## 2019-07-19 ENCOUNTER — Telehealth: Payer: Self-pay | Admitting: Oncology

## 2019-07-19 ENCOUNTER — Inpatient Hospital Stay: Payer: Medicare Other | Admitting: Oncology

## 2019-07-19 ENCOUNTER — Inpatient Hospital Stay: Payer: Medicare Other | Attending: Oncology

## 2019-07-19 VITALS — BP 136/79 | HR 90 | Temp 98.3°F | Resp 18 | Ht 69.0 in | Wt 190.4 lb

## 2019-07-19 DIAGNOSIS — D759 Disease of blood and blood-forming organs, unspecified: Secondary | ICD-10-CM | POA: Insufficient documentation

## 2019-07-19 DIAGNOSIS — C9110 Chronic lymphocytic leukemia of B-cell type not having achieved remission: Secondary | ICD-10-CM

## 2019-07-19 LAB — CBC WITH DIFFERENTIAL (CANCER CENTER ONLY)
Abs Immature Granulocytes: 0.04 10*3/uL (ref 0.00–0.07)
Basophils Absolute: 0 10*3/uL (ref 0.0–0.1)
Basophils Relative: 0 %
Eosinophils Absolute: 0.2 10*3/uL (ref 0.0–0.5)
Eosinophils Relative: 1 %
HCT: 44.9 % (ref 39.0–52.0)
Hemoglobin: 15 g/dL (ref 13.0–17.0)
Immature Granulocytes: 0 %
Lymphocytes Relative: 68 %
Lymphs Abs: 9 10*3/uL — ABNORMAL HIGH (ref 0.7–4.0)
MCH: 29.6 pg (ref 26.0–34.0)
MCHC: 33.4 g/dL (ref 30.0–36.0)
MCV: 88.7 fL (ref 80.0–100.0)
Monocytes Absolute: 0.7 10*3/uL (ref 0.1–1.0)
Monocytes Relative: 5 %
Neutro Abs: 3.4 10*3/uL (ref 1.7–7.7)
Neutrophils Relative %: 26 %
Platelet Count: 172 10*3/uL (ref 150–400)
RBC: 5.06 MIL/uL (ref 4.22–5.81)
RDW: 13.2 % (ref 11.5–15.5)
WBC Count: 13.3 10*3/uL — ABNORMAL HIGH (ref 4.0–10.5)
nRBC: 0 % (ref 0.0–0.2)

## 2019-07-19 LAB — CMP (CANCER CENTER ONLY)
ALT: 17 U/L (ref 0–44)
AST: 18 U/L (ref 15–41)
Albumin: 4.2 g/dL (ref 3.5–5.0)
Alkaline Phosphatase: 69 U/L (ref 38–126)
Anion gap: 12 (ref 5–15)
BUN: 21 mg/dL (ref 8–23)
CO2: 21 mmol/L — ABNORMAL LOW (ref 22–32)
Calcium: 8.9 mg/dL (ref 8.9–10.3)
Chloride: 109 mmol/L (ref 98–111)
Creatinine: 1.39 mg/dL — ABNORMAL HIGH (ref 0.61–1.24)
GFR, Est AFR Am: 56 mL/min — ABNORMAL LOW (ref >60)
GFR, Estimated: 49 mL/min — ABNORMAL LOW (ref >60)
Glucose, Bld: 114 mg/dL — ABNORMAL HIGH (ref 70–99)
Potassium: 4.1 mmol/L (ref 3.5–5.1)
Sodium: 142 mmol/L (ref 135–145)
Total Bilirubin: 1.2 mg/dL (ref 0.3–1.2)
Total Protein: 6.8 g/dL (ref 6.5–8.1)

## 2019-07-19 NOTE — Progress Notes (Signed)
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   CURRENT THERAPY: Observation and surveillance.  INTERVAL HISTORY: [name] is here for a follow-up visit.  Since the last visit, he reports no major changes in his health.  He continues to be active and attends to activities of daily living.  He denies any fevers, chills or sweats.  Denies any nausea or abdominal distention.  He denies any early satiety.  His performance status and quality of life is unchanged.  Patient denied any alteration mental status, neuropathy, confusion or dizziness.  Denies any headaches or lethargy.  Denies any night sweats, weight loss or changes in appetite.  Denied orthopnea, dyspnea on exertion or chest discomfort.  Denies shortness of breath, difficulty breathing hemoptysis or cough.  Denies any abdominal distention, nausea, early satiety or dyspepsia.  Denies any hematuria, frequency, dysuria or nocturia.  Denies any skin irritation, dryness or rash.  Denies any ecchymosis or petechiae.  Denies any lymphadenopathy or clotting.  Denies any heat or cold intolerance.  Denies any anxiety or depression.  Remaining review of system is negative.       ALLERGIES:  has No Known Allergies.  MEDICATIONS: Reviewed without any changes.  Current Outpatient Medications on File Prior to Visit  Medication Sig Dispense Refill  . clopidogrel (PLAVIX) 75 MG tablet TAKE 1 TABLET BY MOUTH EVERY DAY WITH BREAKFAST 90 tablet 1  . furosemide (LASIX) 40 MG tablet Take 1 tablet (40 mg total) by mouth daily. 90 tablet 1  . Omega-3 Fatty Acids (FISH OIL) 1200 MG CAPS Take 1,200 mg by mouth every evening.    . simvastatin (ZOCOR) 40 MG tablet TAKE 1 TABLET (40 MG TOTAL) BY MOUTH AT BEDTIME. 90 tablet 1   No current facility-administered  medications on file prior to visit.      PHYSICAL EXAMINATION: Blood pressure 136/79, pulse 90, temperature 98.3 F (36.8 C), temperature source Temporal, resp. rate 18, height 5\' 9"  (1.753 m), weight 190 lb 6.4 oz (86.4 kg), SpO2 96 %.   ECOG PERFORMANCE STATUS: 0 - Asymptomatic     General appearance: Alert, awake without any distress. Head: Atraumatic without abnormalities Oropharynx: Without any thrush or ulcers. Eyes: No scleral icterus. Lymph nodes: No lymphadenopathy noted in the cervical, supraclavicular, or axillary nodes Heart:regular rate and rhythm, without any murmurs or gallops.   Lung: Clear to auscultation without any rhonchi, wheezes or dullness to percussion. Abdomin: Soft, nontender without any shifting dullness or ascites. Musculoskeletal: No clubbing or cyanosis. Neurological: No motor or sensory deficits. Skin: No rashes or lesions. Psychiatric: Mood and affect appeared normal.      LABORATORY DATA: CBC    Component Value Date/Time   WBC 13.3 (H) 07/19/2019 0948   WBC 16.7 (H) 07/20/2018 1313   RBC 5.06 07/19/2019 0948   HGB 15.0 07/19/2019 0948   HGB 14.6 07/17/2017 1259   HCT 44.9 07/19/2019 0948   HCT 43.4 07/17/2017 1259   PLT 172 07/19/2019 0948   PLT 154 07/17/2017 1259   MCV 88.7 07/19/2019 0948   MCV 85.8 07/17/2017 1259   MCH 29.6 07/19/2019 0948   MCHC 33.4 07/19/2019 0948   RDW 13.2 07/19/2019 0948   RDW 14.2 07/17/2017 1259   LYMPHSABS PENDING 07/19/2019 0948   LYMPHSABS 9.6 (H) 07/17/2017 1259   MONOABS PENDING 07/19/2019  0948   MONOABS 0.5 07/17/2017 1259   EOSABS PENDING 07/19/2019 0948   EOSABS 0.1 07/17/2017 1259   BASOSABS PENDING 07/19/2019 0948   BASOSABS 0.0 07/17/2017 1259      ASSESSMENT AND PLAN:  78 year old gentleman with:  1.  CLL presented with lymphocytosis and mild adenopathy in 2002 and has not required treatment since that time.  Continues to be on active surveillance.    His CBC was personally  reviewed today and continues to show stable white cell count without any evidence to suggest bone marrow involvement.  The natural course of this disease as well as indication for treatment were reiterated.  These would include painful adenopathy, bone marrow failure as well as constitutional symptoms.  At this time he is not exhibiting any signs or symptoms.  I recommended continued active surveillance with follow-up in 12 months.  2.  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  3. Follow-up: He will return in 1 year.  15  minutes was spent with the patient face-to-face today.  More than 50% of time was spent on updating his disease status, treatment options and answering questions regarding future plan of care.  Zola Button MD 07/19/19

## 2019-07-19 NOTE — Telephone Encounter (Signed)
Scheduled appt per 11/17 los.  Spoke with pt and he is aware of his appt date and time.

## 2019-08-09 ENCOUNTER — Other Ambulatory Visit: Payer: Self-pay | Admitting: Internal Medicine

## 2019-08-10 ENCOUNTER — Other Ambulatory Visit: Payer: Self-pay | Admitting: Internal Medicine

## 2019-08-10 MED ORDER — SIMVASTATIN 40 MG PO TABS: ORAL_TABLET | ORAL | 1 refills | Status: DC

## 2019-08-10 NOTE — Telephone Encounter (Signed)
Last filled 05/31/2018 by Dr. Sherren Mocha Last OV 07/20/2018

## 2019-08-10 NOTE — Telephone Encounter (Signed)
Patient needs Simvastatin refilled.  Last pill will be tomorrow, 08/11/19.  This request came in per MyChart.

## 2019-08-23 IMAGING — DX DG CHEST 2V
2 series · 2 of 2 positions shown · non-contrast
Comparison: Portable chest x-ray September 14, 2012

CLINICAL DATA: Ten days of cough, chest congestion, shortness of
breath, fever, and chills. History of chronic lymphoblastic leukemia

EXAM:
CHEST  2 VIEW

[chest pa]
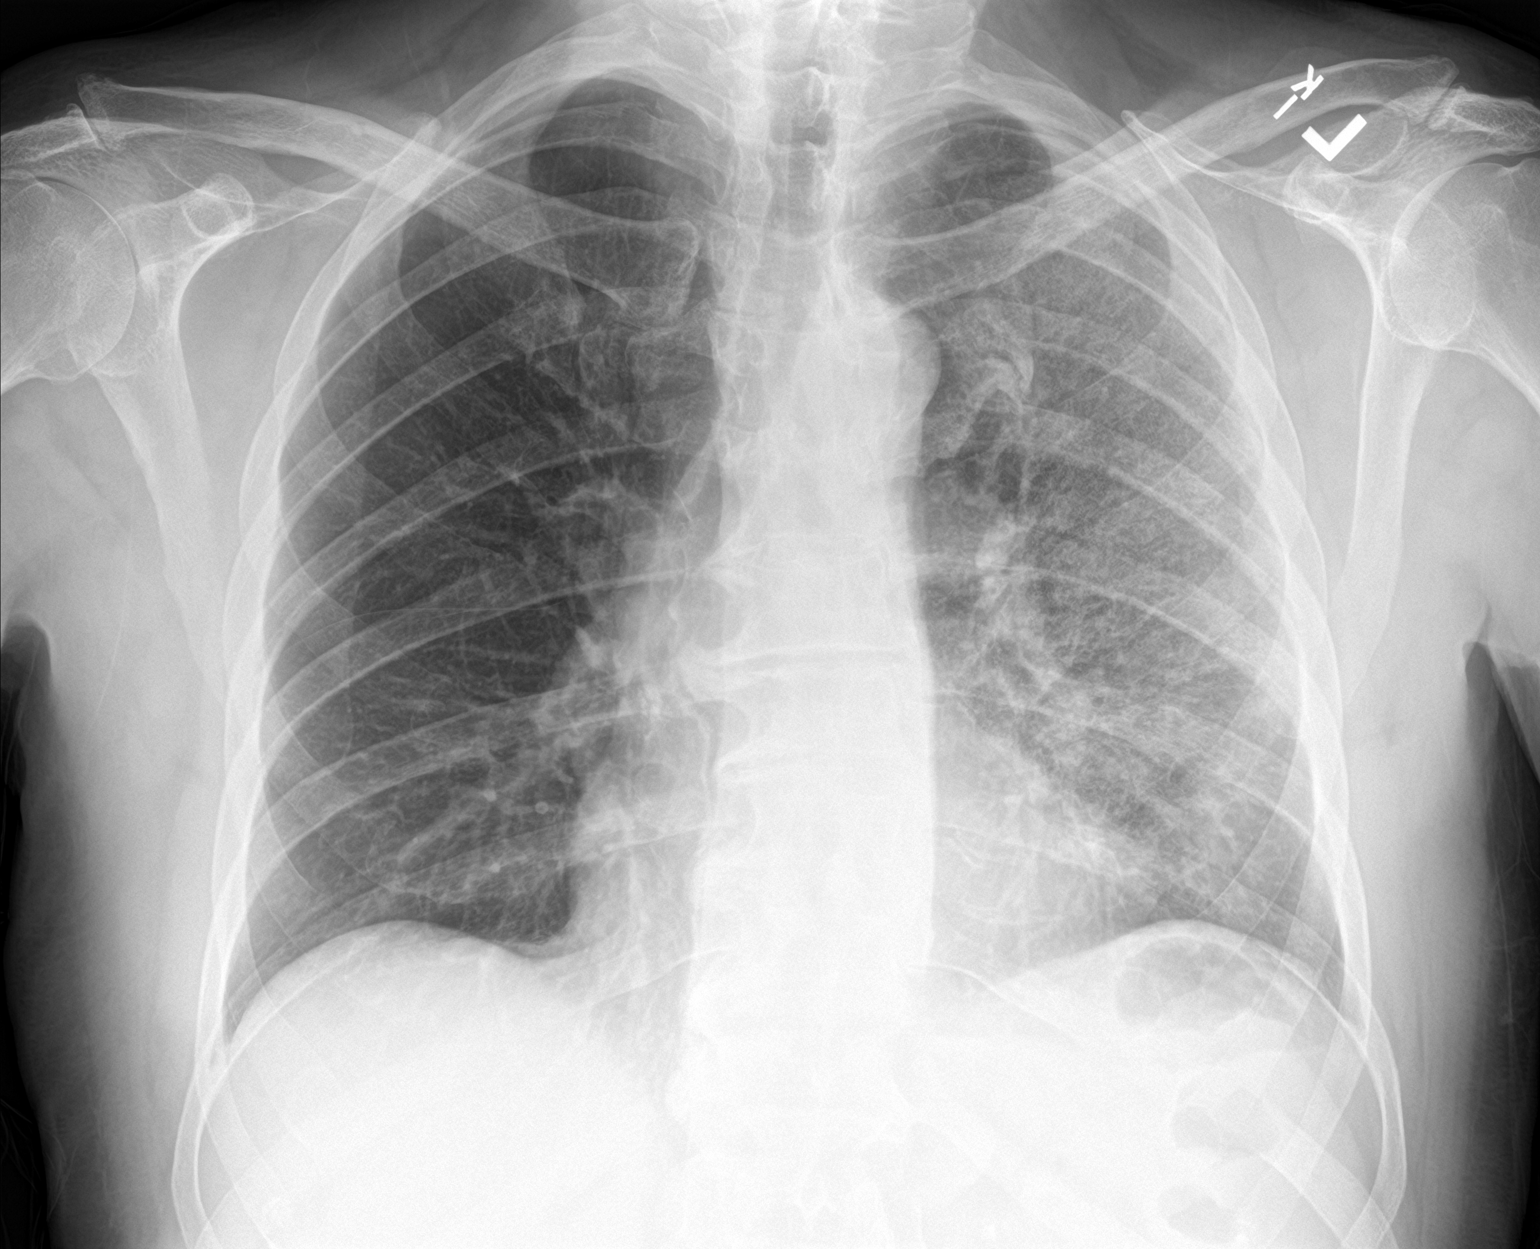

[chest lat]
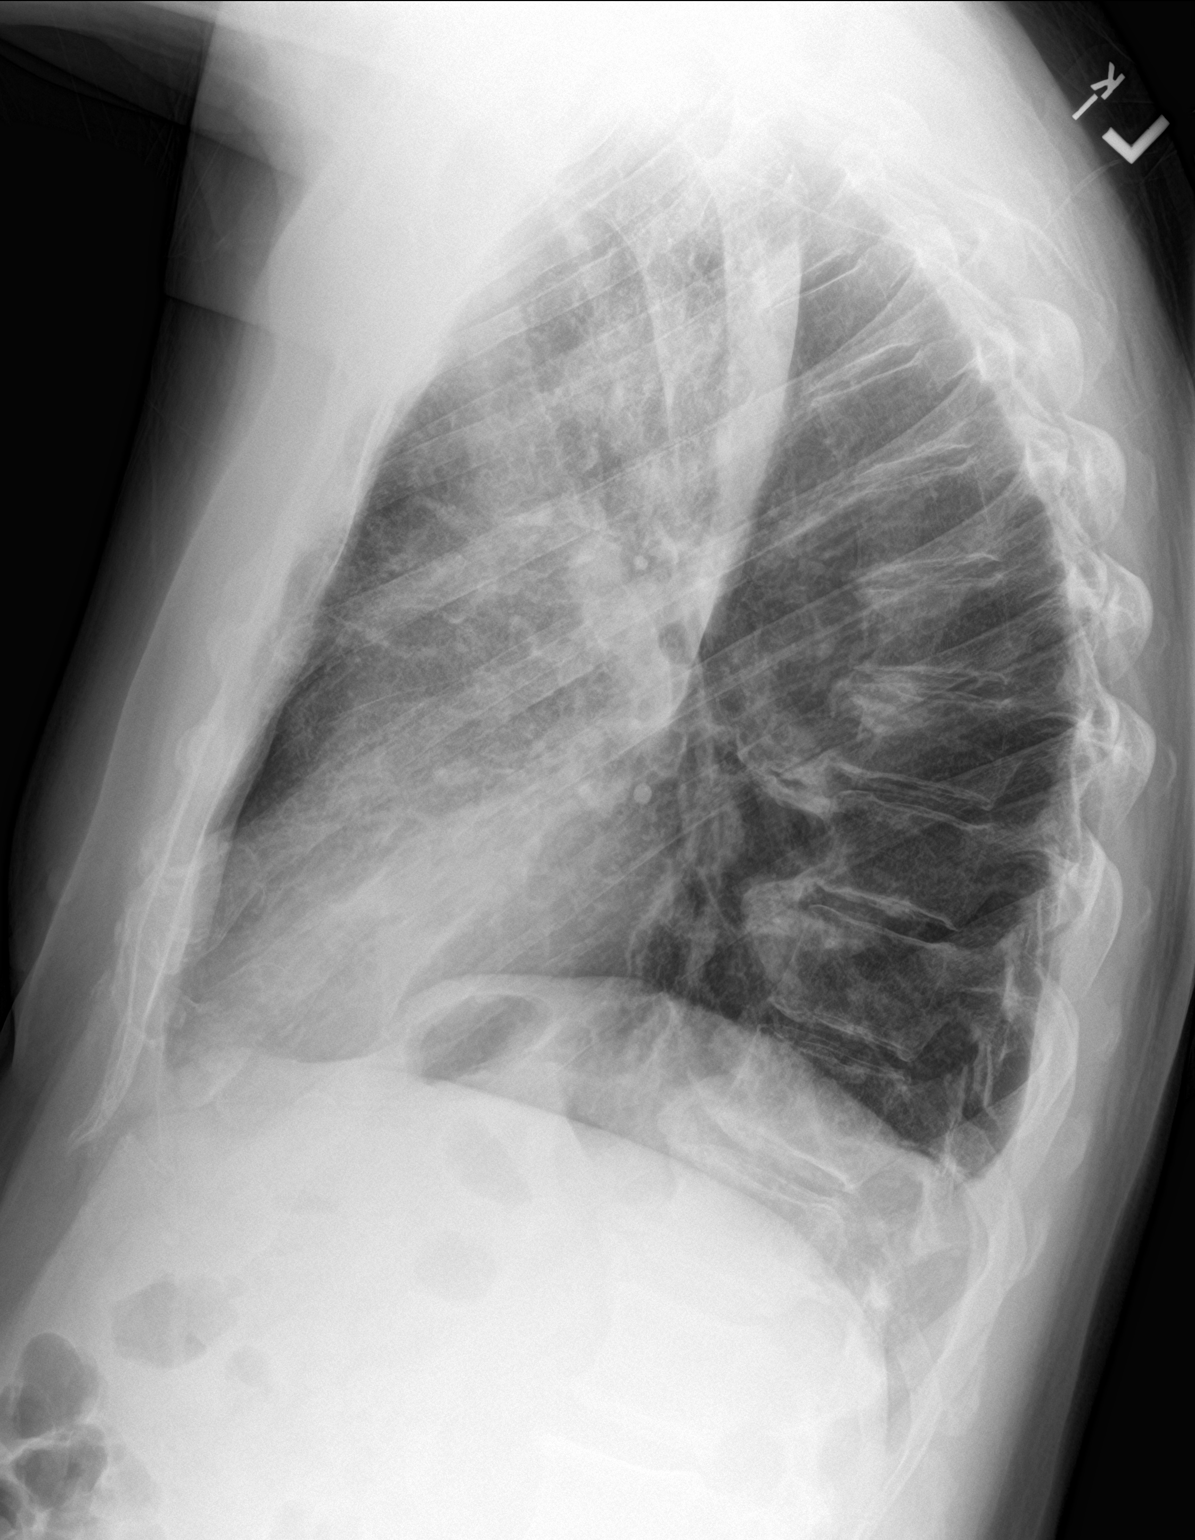

[2 of 2 positions shown; findings below may reference images not displayed]

FINDINGS: There is diffuse interstitial infiltrate throughout the left upper
lobe. The left lower lobe is clear. The right lung is clear. There
may be a trace of pleural fluid on the left. The heart and pulmonary
vascularity are normal. There is calcification in the wall of the
aortic arch. The bony thorax exhibits no acute abnormality. There
are large anterior bridging osteophytes in the in the mid and lower
thoracic spine.
IMPRESSION: Interstitial infiltrate consistent with pneumonia throughout the
left upper lobe. Followup PA and lateral chest X-ray is recommended
in 3-4 weeks following trial of antibiotic therapy to ensure
resolution and exclude underlying malignancy.

Thoracic aortic atherosclerosis.

## 2019-09-02 ENCOUNTER — Encounter: Payer: Self-pay | Admitting: Internal Medicine

## 2019-09-12 ENCOUNTER — Encounter: Payer: Self-pay | Admitting: Internal Medicine

## 2019-09-16 ENCOUNTER — Ambulatory Visit (INDEPENDENT_AMBULATORY_CARE_PROVIDER_SITE_OTHER): Payer: Medicare Other | Admitting: Internal Medicine

## 2019-09-16 ENCOUNTER — Other Ambulatory Visit: Payer: Self-pay

## 2019-09-16 ENCOUNTER — Encounter: Payer: Self-pay | Admitting: Internal Medicine

## 2019-09-16 ENCOUNTER — Other Ambulatory Visit: Payer: Self-pay | Admitting: Internal Medicine

## 2019-09-16 VITALS — BP 130/80 | HR 93 | Temp 97.0°F | Ht 69.0 in | Wt 190.4 lb

## 2019-09-16 DIAGNOSIS — N400 Enlarged prostate without lower urinary tract symptoms: Secondary | ICD-10-CM | POA: Diagnosis not present

## 2019-09-16 DIAGNOSIS — E785 Hyperlipidemia, unspecified: Secondary | ICD-10-CM | POA: Diagnosis not present

## 2019-09-16 DIAGNOSIS — H6123 Impacted cerumen, bilateral: Secondary | ICD-10-CM

## 2019-09-16 DIAGNOSIS — Z86010 Personal history of colonic polyps: Secondary | ICD-10-CM

## 2019-09-16 DIAGNOSIS — Z0000 Encounter for general adult medical examination without abnormal findings: Secondary | ICD-10-CM | POA: Diagnosis not present

## 2019-09-16 DIAGNOSIS — N1831 Chronic kidney disease, stage 3a: Secondary | ICD-10-CM | POA: Diagnosis not present

## 2019-09-16 DIAGNOSIS — R7302 Impaired glucose tolerance (oral): Secondary | ICD-10-CM | POA: Diagnosis not present

## 2019-09-16 DIAGNOSIS — E559 Vitamin D deficiency, unspecified: Secondary | ICD-10-CM

## 2019-09-16 DIAGNOSIS — C9110 Chronic lymphocytic leukemia of B-cell type not having achieved remission: Secondary | ICD-10-CM

## 2019-09-16 LAB — COMPREHENSIVE METABOLIC PANEL
ALT: 13 U/L (ref 0–53)
AST: 16 U/L (ref 0–37)
Albumin: 4.3 g/dL (ref 3.5–5.2)
Alkaline Phosphatase: 72 U/L (ref 39–117)
BUN: 19 mg/dL (ref 6–23)
CO2: 25 mEq/L (ref 19–32)
Calcium: 9.1 mg/dL (ref 8.4–10.5)
Chloride: 105 mEq/L (ref 96–112)
Creatinine, Ser: 1.24 mg/dL (ref 0.40–1.50)
GFR: 56.41 mL/min — ABNORMAL LOW (ref >60.00)
Glucose, Bld: 116 mg/dL — ABNORMAL HIGH (ref 70–99)
Potassium: 4 mEq/L (ref 3.5–5.1)
Sodium: 140 mEq/L (ref 135–145)
Total Bilirubin: 1.3 mg/dL — ABNORMAL HIGH (ref 0.2–1.2)
Total Protein: 6.4 g/dL (ref 6.0–8.3)

## 2019-09-16 LAB — CBC WITH DIFFERENTIAL/PLATELET
Basophils Absolute: 0 10*3/uL (ref 0.0–0.1)
Basophils Relative: 0.3 % (ref 0.0–3.0)
Eosinophils Absolute: 0.1 10*3/uL (ref 0.0–0.7)
Eosinophils Relative: 1.1 % (ref 0.0–5.0)
HCT: 46.3 % (ref 39.0–52.0)
Hemoglobin: 15.4 g/dL (ref 13.0–17.0)
Lymphocytes Relative: 66 % — ABNORMAL HIGH (ref 12.0–46.0)
Lymphs Abs: 8.3 10*3/uL — ABNORMAL HIGH (ref 0.7–4.0)
MCHC: 33.4 g/dL (ref 30.0–36.0)
MCV: 87.3 fl (ref 78.0–100.0)
Monocytes Absolute: 0.6 10*3/uL (ref 0.1–1.0)
Monocytes Relative: 4.7 % (ref 3.0–12.0)
Neutro Abs: 3.5 10*3/uL (ref 1.4–7.7)
Neutrophils Relative %: 27.9 % — ABNORMAL LOW (ref 43.0–77.0)
Platelets: 154 10*3/uL (ref 150.0–400.0)
RBC: 5.31 Mil/uL (ref 4.22–5.81)
RDW: 13.6 % (ref 11.5–15.5)
WBC: 12.5 10*3/uL — ABNORMAL HIGH (ref 4.0–10.5)

## 2019-09-16 LAB — LIPID PANEL
Cholesterol: 171 mg/dL (ref 0–200)
HDL: 45.9 mg/dL (ref >39.00)
LDL Cholesterol: 104 mg/dL — ABNORMAL HIGH (ref 0–99)
NonHDL: 125.28
Total CHOL/HDL Ratio: 4
Triglycerides: 104 mg/dL (ref 0.0–149.0)
VLDL: 20.8 mg/dL (ref 0.0–40.0)

## 2019-09-16 LAB — PSA: PSA: 1.93 ng/mL (ref 0.10–4.00)

## 2019-09-16 LAB — VITAMIN B12: Vitamin B-12: 243 pg/mL (ref 211–911)

## 2019-09-16 LAB — HEMOGLOBIN A1C: Hgb A1c MFr Bld: 5.7 % (ref 4.6–6.5)

## 2019-09-16 LAB — TSH: TSH: 3.05 u[IU]/mL (ref 0.35–4.50)

## 2019-09-16 LAB — VITAMIN D 25 HYDROXY (VIT D DEFICIENCY, FRACTURES): VITD: 24.53 ng/mL — ABNORMAL LOW (ref 30.00–100.00)

## 2019-09-16 MED ORDER — VITAMIN D (ERGOCALCIFEROL) 1.25 MG (50000 UNIT) PO CAPS: 50000.0000 [IU] | ORAL_CAPSULE | ORAL | 0 refills | Status: AC

## 2019-09-16 MED ORDER — FUROSEMIDE 40 MG PO TABS: 40.0000 mg | ORAL_TABLET | Freq: Every day | ORAL | 1 refills | Status: DC

## 2019-09-16 NOTE — Patient Instructions (Signed)
-  Nice seeing you today!!  -Lab work today; will notify you once results are available.  -Tetanus vaccine at the pharmacy.  -Remember to schedule your eye and dental appointments.

## 2019-09-16 NOTE — Addendum Note (Signed)
Addended by: Suzette Battiest on: 09/16/2019 09:38 AM   Modules accepted: Orders

## 2019-09-16 NOTE — Progress Notes (Signed)
Established Patient Office Visit     This visit occurred during the SARS-CoV-2 public health emergency.  Safety protocols were in place, including screening questions prior to the visit, additional usage of staff PPE, and extensive cleaning of exam room while observing appropriate contact time as indicated for disinfecting solutions.    CC/Reason for Visit: Annual preventive exam and subsequent Medicare wellness visit  HPI: Samuel Stephens is a 79 y.o. male who is coming in today for the above mentioned reasons. 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  He has been doing well, has no complaints.  He has deferred eye and dental care this past year due to Covid concerns, he is wearing hearing aids and feels like his right hearing aid may be malfunctioning.  He exercises by walking daily and doing garden work.  He is due [facility]  He has been going through a difficult situation at home.  His wife was diagnosed with ovarian cancer this past year and she has been going through chemotherapy and surgery.  She is doing well per reports.   Past Medical/Surgical History: Past Medical History:  Diagnosis Date  . CKD (chronic kidney disease)   . CLL (chronic lymphoblastic leukemia) 2002  . 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 Glucose intolerance (impaired glucose tolerance)   . Hyperlipidemia   . Pedal edema     Past Surgical History:  Procedure Laterality Date  . COLONOSCOPY W/ BIOPSIES     multiple  . TEE WITHOUT CARDIOVERSION  09/16/2012   Procedure: TRANSESOPHAGEAL ECHOCARDIOGRAM (TEE);  Surgeon: Larey Dresser, MD;  Location: [facility];  Service: Cardiovascular;  Laterality: N/A;    Social History:  reports that he has never smoked. He has never used smokeless tobacco. He reports current alcohol use. He reports that he does not use drugs.   Allergies: No Known Allergies  Family History:  Family History  Problem Relation Age of Onset  . Stroke Mother   . Uterine cancer Mother   . Thyroid cancer Mother   . Stroke Father   . Diabetes Father   . Colon cancer Neg Hx   . Stomach cancer Neg Hx      Current Outpatient Medications:  .  clopidogrel (PLAVIX) 75 MG tablet, TAKE 1 TABLET BY MOUTH EVERY DAY WITH BREAKFAST, Disp: 90 tablet, Rfl: 1 .  furosemide (LASIX) 40 MG tablet, Take 1 tablet (40 mg total) by mouth daily., Disp: 90 tablet, Rfl: 1 .  Omega-3 Fatty Acids (FISH OIL) 1200 MG CAPS, Take 1,200 mg by mouth every evening., Disp: , Rfl:  .  simvastatin (ZOCOR) 40 MG tablet, TAKE 1 TABLET (40 MG TOTAL) BY MOUTH AT BEDTIME., Disp: 90 tablet, Rfl: 1  Review of Systems:  Constitutional: Denies fever, chills, diaphoresis, appetite change and fatigue.  HEENT: Denies photophobia, eye pain, redness, hearing loss, ear pain, congestion, sore throat, rhinorrhea, sneezing, mouth sores, trouble swallowing, neck pain, neck stiffness and tinnitus.   Respiratory: Denies SOB, DOE, cough, chest tightness,  and wheezing.   Cardiovascular: Denies chest pain, palpitations and leg swelling.  Gastrointestinal: Denies nausea, vomiting, abdominal pain, diarrhea, constipation, blood in stool and abdominal distention.  Genitourinary: Denies dysuria, urgency, frequency, hematuria, flank pain and difficulty urinating.  Endocrine: Denies: hot or cold intolerance, sweats, changes in hair or nails, polyuria, polydipsia. Musculoskeletal: Denies  myalgias, back pain, joint swelling, arthralgias and gait problem.  Skin: Denies pallor, rash and wound.  Neurological: Denies dizziness, seizures, syncope, weakness, light-headedness, numbness and headaches.  Hematological: Denies adenopathy. Easy bruising, personal or family bleeding history  Psychiatric/Behavioral: Denies suicidal ideation, mood changes, confusion, nervousness, sleep disturbance and agitation     Physical Exam: Vitals:   09/16/19 0838  BP: 130/80  Pulse: 93  Temp: (!) 97 F (36.1 C)  TempSrc: Temporal  SpO2: 95%  Weight: 190 lb 6.4 oz (86.4 kg)  Height: 5\' 9"  (1.753 m)    Body mass index is 28.12 kg/m.   Constitutional: NAD, calm, comfortable Eyes: PERRL, lids and conjunctivae normal, hard of hearing even with hearing aids ENMT: Mucous membranes are moist. Tympanic membrane is obstructed by cerumen bilaterally.   Neck: normal, supple, no masses, no thyromegaly Respiratory: clear to auscultation bilaterally, no wheezing, no crackles. Normal respiratory effort. No accessory muscle use.  Cardiovascular: Regular rate and rhythm, no murmurs / rubs / gallops. No extremity edema. 2+ pedal pulses. .  Abdomen: no tenderness, no masses palpated. No hepatosplenomegaly. Bowel sounds positive.  Musculoskeletal: no clubbing / cyanosis. No joint deformity upper and lower extremities. Good ROM, no contractures. Normal muscle tone.  Skin: no rashes, lesions, ulcers. No induration Neurologic: CN 2-12 grossly intact. Sensation intact, DTR normal. Strength 5/5 in all 4.  Psychiatric: Normal judgment and insight. Alert and oriented x 3. Normal mood.    Subsequent Medicare wellness visit   1. Risk factors, based on past  M,S,F -cardiovascular disease risk factors include age, gender, history of hyperlipidemia.   2.  Physical activities: Walks daily and yard work   3.  Depression/mood:  Mood is stable, not depressed   4.  Hearing:  He feels his hearing has deteriorated   5.  ADL's: Independent in all ADLs   6.  Fall risk:  Low fall risk   7.  Home safety: No problems identified   8.  Height weight, and visual acuity: Height and weight as above, visual acuity is 20/40 with the left eye, 20/50 with the right eye and 20/32 with eyes together   9.  Counseling:  Advised to follow-up with his audiology center to check his hearing aids.   10. Lab orders based on risk factors:  Laboratory update will be reviewed   11. Referral :  None today   12. Care plan:  Follow-up with me in 6 months   13. Cognitive assessment:  No cognitive impairment   14. Screening: Patient provided with a written and personalized 5-10 year screening schedule in the AVS.   yes   15. Provider List Update:   PCP  16. Advance Directives: Full code     Office Visit from 09/16/2019 in [facility] at [facility]  PHQ-9 Total Score  0      Fall Risk  09/16/2019 07/20/2018 02/17/2018 02/10/2017 02/01/2016  Falls in the past year? 0 0 No No No  Number falls in past yr: 0 0 - - -  Injury with Fall? 0 0 - - -     Impression and Plan:  Encounter for preventive health examination  -He will resume his eye and dental care this year that he had placed on hold last year due to the pandemic. -He is due for Tdap which he will receive at his pharmacy, otherwise immunizations are up-to-date. -Screening labs today. -Healthy lifestyle discussed in detail. -He is due for his repeat colonoscopy, last was in 2015, he  received a letter for an appointment last year, however he deferred due to pandemic. -Check PSA for prostate cancer screening.  Chronic lymphocytic leukemia ([place])  -Follows with heme-onc, Dr. Alen Blew on schedule.  Dyslipidemia  - Plan: Lipid panel -Last LDL was 126 in 2019.  He is on simvastatin 40 mg.  Benign prostatic hyperplasia without lower urinary tract symptoms -Noted  Stage 3a chronic kidney disease -Has been stable, recheck kidney function today, was last 1.39 in November.  History of colonic polyps -Overdue for repeat screening colonoscopy, he will schedule  Impaired glucose tolerance  - Plan: Hemoglobin A1c  Bilateral impacted cerumen Cerumen Desimpaction  After obtaining patient  consent , warm water was applied and gentle ear lavage performed on bilateral ears. There were no complications and following the desimpaction the tympanic membranes were visible.  Tympanic membranes are intact following the procedure. Auditory canals are normal. The patient reported relief of symptoms after removal of cerumen.     Patient Instructions  -Nice seeing you today!!  -Lab work today; will notify you once results are available.  -Tetanus vaccine at the pharmacy.  -Remember to schedule your eye and dental appointments.     Lelon Frohlich, MD [facility]

## 2019-09-21 ENCOUNTER — Other Ambulatory Visit: Payer: Self-pay | Admitting: Internal Medicine

## 2019-09-21 DIAGNOSIS — E559 Vitamin D deficiency, unspecified: Secondary | ICD-10-CM

## 2019-09-22 DIAGNOSIS — H524 Presbyopia: Secondary | ICD-10-CM | POA: Diagnosis not present

## 2019-09-22 DIAGNOSIS — H353132 Nonexudative age-related macular degeneration, bilateral, intermediate dry stage: Secondary | ICD-10-CM | POA: Diagnosis not present

## 2019-09-22 DIAGNOSIS — H2513 Age-related nuclear cataract, bilateral: Secondary | ICD-10-CM | POA: Diagnosis not present

## 2019-09-22 DIAGNOSIS — H5203 Hypermetropia, bilateral: Secondary | ICD-10-CM | POA: Diagnosis not present

## 2019-09-22 DIAGNOSIS — H52223 Regular astigmatism, bilateral: Secondary | ICD-10-CM | POA: Diagnosis not present

## 2019-10-02 ENCOUNTER — Ambulatory Visit: Payer: Medicare Other

## 2019-10-09 ENCOUNTER — Ambulatory Visit: Payer: Medicare Other | Attending: Internal Medicine

## 2019-10-09 DIAGNOSIS — Z23 Encounter for immunization: Secondary | ICD-10-CM | POA: Insufficient documentation

## 2019-10-09 NOTE — Progress Notes (Signed)
   Covid-19 Vaccination Clinic  Name:  NICKOLAOS BOGARD    MRN: [id number] DOB: 04/06/1941  10/09/2019  Mr. Banner was observed post Covid-19 immunization for 15 minutes without incidence. He was provided with Vaccine Information Sheet and instruction to access the V-Safe system.   Mr. Bott was instructed to call 911 with any severe reactions post vaccine: Marland Kitchen Difficulty breathing  . Swelling of your face and throat  . A fast heartbeat  . A bad rash all over your body  . Dizziness and weakness    Immunizations Administered    Name Date Dose VIS Date Route   Pfizer COVID-19 Vaccine 10/09/2019  9:34 AM 0.3 mL 08/12/2019 Intramuscular   Manufacturer: [place]   Lot: [id number]   [place]: [id number]

## 2019-10-13 ENCOUNTER — Ambulatory Visit: Payer: Medicare Other

## 2019-11-02 ENCOUNTER — Ambulatory Visit: Payer: Medicare Other | Attending: Internal Medicine

## 2019-11-02 DIAGNOSIS — Z23 Encounter for immunization: Secondary | ICD-10-CM

## 2019-11-02 NOTE — Progress Notes (Signed)
   Covid-19 Vaccination Clinic  Name:  Samuel Stephens    MRN: [id number] DOB: 09/11/1940  11/02/2019  [name] was observed post Covid-19 immunization for 15 minutes without incident. He was provided with Vaccine Information Sheet and instruction to access the V-Safe system.   [name] was instructed to call 911 with any severe reactions post vaccine: Marland Kitchen Difficulty breathing  . Swelling of face and throat  . A fast heartbeat  . A bad rash all over body  . Dizziness and weakness   Immunizations Administered    Name Date Dose VIS Date Route   Pfizer COVID-19 Vaccine 11/02/2019  3:52 PM 0.3 mL 08/12/2019 Intramuscular   Manufacturer: [place]   Lot: [id number]   [place]: [id number]

## 2019-11-07 ENCOUNTER — Encounter: Payer: Self-pay | Admitting: Internal Medicine

## 2019-11-09 ENCOUNTER — Other Ambulatory Visit: Payer: Self-pay

## 2019-11-09 ENCOUNTER — Telehealth (INDEPENDENT_AMBULATORY_CARE_PROVIDER_SITE_OTHER): Payer: Medicare Other | Admitting: Adult Health

## 2019-11-09 DIAGNOSIS — R05 Cough: Secondary | ICD-10-CM

## 2019-11-09 DIAGNOSIS — R509 Fever, unspecified: Secondary | ICD-10-CM | POA: Diagnosis not present

## 2019-11-09 DIAGNOSIS — R059 Cough, unspecified: Secondary | ICD-10-CM

## 2019-11-09 NOTE — Progress Notes (Signed)
Virtual Visit via Telephone Note  I connected with Samuel Stephens on 11/09/19 at  4:00 PM EST by telephone and verified that I am speaking with the correct person using two identifiers.   I discussed the limitations, risks, security and privacy concerns of performing an evaluation and management service by telephone and the availability of in person appointments. I also discussed with the patient that there may be a patient responsible charge related to this service. The patient expressed understanding and agreed to proceed.  Location patient: home Location provider: work or home office Participants present for the call: patient, provider Patient did not have a visit in the prior 7 days to address this/these issue(s).   History of Present Illness: 79 year old male who  has a past medical history of CKD (chronic kidney disease), CLL (chronic lymphoblastic leukemia) (2002), COLONIC POLYPS, HX OF- Adenomas (03/26/2007), Glucose intolerance (impaired glucose tolerance), Hyperlipidemia, and Pedal edema.  He was originally placed on the schedule to be seen for 1 week of low-grade fever up to 99 degrees that started after COVID vaccination. .  During this visit he reports that low-grade fever has resolved.  His biggest concern today is that of a sleep dry cough that happens every morning when he wakes up.  He reports that he will cough for 5 times and then be done for the rest of the day.  He denies shortness of breath, chest pain, abdominal pain, or feeling acutely ill.  Has been present for approximately 1 year.  He would like to have a chest x-ray for "peace of mind".   Observations/Objective: Patient sounds cheerful and well on the phone. I do not appreciate any SOB. Speech and thought processing are grossly intact. Patient reported vitals:  Assessment and Plan: 1. Cough  - DG Chest 2 View; Future  2. Low grade fever -Has resolved.  Likely from immune response from Covid vaccination.  He  was advised to follow-up if fever returns.   Follow Up Instructions:   I did not refer this patient for an OV in the next 24 hours for this/these issue(s).  I discussed the assessment and treatment plan with the patient. The patient was provided an opportunity to ask questions and all were answered. The patient agreed with the plan and demonstrated an understanding of the instructions.   The patient was advised to call back or seek an in-person evaluation if the symptoms worsen or if the condition fails to improve as anticipated.  I provided 12 minutes of non-face-to-face time during this encounter.   Samuel Peng, NP

## 2019-11-15 ENCOUNTER — Other Ambulatory Visit: Payer: Self-pay

## 2019-11-16 ENCOUNTER — Other Ambulatory Visit: Payer: Medicare Other

## 2019-11-16 ENCOUNTER — Ambulatory Visit (INDEPENDENT_AMBULATORY_CARE_PROVIDER_SITE_OTHER): Payer: Medicare Other

## 2019-11-16 DIAGNOSIS — E559 Vitamin D deficiency, unspecified: Secondary | ICD-10-CM

## 2019-11-16 DIAGNOSIS — R059 Cough, unspecified: Secondary | ICD-10-CM

## 2019-11-16 DIAGNOSIS — R05 Cough: Secondary | ICD-10-CM

## 2019-12-06 ENCOUNTER — Other Ambulatory Visit: Payer: Self-pay | Admitting: Internal Medicine

## 2019-12-06 DIAGNOSIS — E559 Vitamin D deficiency, unspecified: Secondary | ICD-10-CM

## 2019-12-19 ENCOUNTER — Other Ambulatory Visit: Payer: Self-pay

## 2019-12-20 ENCOUNTER — Other Ambulatory Visit (INDEPENDENT_AMBULATORY_CARE_PROVIDER_SITE_OTHER): Payer: Medicare Other

## 2019-12-20 DIAGNOSIS — R05 Cough: Secondary | ICD-10-CM | POA: Diagnosis not present

## 2019-12-20 DIAGNOSIS — E559 Vitamin D deficiency, unspecified: Secondary | ICD-10-CM

## 2019-12-20 DIAGNOSIS — R059 Cough, unspecified: Secondary | ICD-10-CM

## 2019-12-20 LAB — VITAMIN D 25 HYDROXY (VIT D DEFICIENCY, FRACTURES): VITD: 43.81 ng/mL (ref 30.00–100.00)

## 2019-12-22 ENCOUNTER — Encounter: Payer: Self-pay | Admitting: Internal Medicine

## 2020-01-03 ENCOUNTER — Encounter: Payer: Self-pay | Admitting: Internal Medicine

## 2020-01-03 ENCOUNTER — Other Ambulatory Visit: Payer: Self-pay | Admitting: Internal Medicine

## 2020-01-31 ENCOUNTER — Other Ambulatory Visit: Payer: Self-pay | Admitting: Internal Medicine

## 2020-03-13 ENCOUNTER — Other Ambulatory Visit: Payer: Self-pay | Admitting: Internal Medicine

## 2020-03-13 DIAGNOSIS — N1831 Chronic kidney disease, stage 3a: Secondary | ICD-10-CM

## 2020-03-27 ENCOUNTER — Encounter: Payer: Self-pay | Admitting: Adult Health

## 2020-03-27 MED ORDER — SIMVASTATIN 40 MG PO TABS: ORAL_TABLET | ORAL | 1 refills | Status: DC

## 2020-04-09 ENCOUNTER — Encounter: Payer: Self-pay | Admitting: Adult Health

## 2020-04-09 DIAGNOSIS — Z1283 Encounter for screening for malignant neoplasm of skin: Secondary | ICD-10-CM

## 2020-04-10 MED ORDER — CLOPIDOGREL BISULFATE 75 MG PO TABS: 75.0000 mg | ORAL_TABLET | Freq: Every day | ORAL | 1 refills | Status: DC

## 2020-04-11 DIAGNOSIS — H2513 Age-related nuclear cataract, bilateral: Secondary | ICD-10-CM | POA: Diagnosis not present

## 2020-04-11 DIAGNOSIS — H353132 Nonexudative age-related macular degeneration, bilateral, intermediate dry stage: Secondary | ICD-10-CM | POA: Diagnosis not present

## 2020-04-18 ENCOUNTER — Other Ambulatory Visit: Payer: Self-pay | Admitting: Internal Medicine

## 2020-04-18 DIAGNOSIS — E559 Vitamin D deficiency, unspecified: Secondary | ICD-10-CM

## 2020-05-02 DIAGNOSIS — D485 Neoplasm of uncertain behavior of skin: Secondary | ICD-10-CM | POA: Diagnosis not present

## 2020-05-02 DIAGNOSIS — L821 Other seborrheic keratosis: Secondary | ICD-10-CM | POA: Diagnosis not present

## 2020-05-16 DIAGNOSIS — Z23 Encounter for immunization: Secondary | ICD-10-CM | POA: Diagnosis not present

## 2020-05-29 ENCOUNTER — Encounter: Payer: Self-pay | Admitting: Internal Medicine

## 2020-05-29 DIAGNOSIS — M25551 Pain in right hip: Secondary | ICD-10-CM

## 2020-06-18 ENCOUNTER — Other Ambulatory Visit: Payer: Self-pay | Admitting: Internal Medicine

## 2020-06-18 DIAGNOSIS — N1831 Chronic kidney disease, stage 3a: Secondary | ICD-10-CM

## 2020-06-21 DIAGNOSIS — M7062 Trochanteric bursitis, left hip: Secondary | ICD-10-CM | POA: Diagnosis not present

## 2020-07-12 DIAGNOSIS — M7062 Trochanteric bursitis, left hip: Secondary | ICD-10-CM | POA: Diagnosis not present

## 2020-07-17 ENCOUNTER — Other Ambulatory Visit: Payer: Self-pay

## 2020-07-17 ENCOUNTER — Inpatient Hospital Stay: Payer: Medicare Other | Admitting: Oncology

## 2020-07-17 ENCOUNTER — Inpatient Hospital Stay: Payer: Medicare Other | Attending: Oncology

## 2020-07-17 VITALS — BP 169/81 | HR 92 | Temp 98.6°F | Resp 18 | Ht 69.0 in | Wt 197.0 lb

## 2020-07-17 DIAGNOSIS — C9110 Chronic lymphocytic leukemia of B-cell type not having achieved remission: Secondary | ICD-10-CM

## 2020-07-17 DIAGNOSIS — D696 Thrombocytopenia, unspecified: Secondary | ICD-10-CM | POA: Diagnosis not present

## 2020-07-17 DIAGNOSIS — D589 Hereditary hemolytic anemia, unspecified: Secondary | ICD-10-CM | POA: Diagnosis not present

## 2020-07-17 DIAGNOSIS — Z79899 Other long term (current) drug therapy: Secondary | ICD-10-CM | POA: Diagnosis not present

## 2020-07-17 LAB — CBC WITH DIFFERENTIAL (CANCER CENTER ONLY)
Abs Immature Granulocytes: 0.16 10*3/uL — ABNORMAL HIGH (ref 0.00–0.07)
Basophils Absolute: 0.1 10*3/uL (ref 0.0–0.1)
Basophils Relative: 1 %
Eosinophils Absolute: 0.2 10*3/uL (ref 0.0–0.5)
Eosinophils Relative: 1 %
HCT: 46.1 % (ref 39.0–52.0)
Hemoglobin: 15.6 g/dL (ref 13.0–17.0)
Immature Granulocytes: 1 %
Lymphocytes Relative: 65 %
Lymphs Abs: 10.8 10*3/uL — ABNORMAL HIGH (ref 0.7–4.0)
MCH: 29.4 pg (ref 26.0–34.0)
MCHC: 33.8 g/dL (ref 30.0–36.0)
MCV: 87 fL (ref 80.0–100.0)
Monocytes Absolute: 0.9 10*3/uL (ref 0.1–1.0)
Monocytes Relative: 5 %
Neutro Abs: 4.5 10*3/uL (ref 1.7–7.7)
Neutrophils Relative %: 27 %
Platelet Count: 188 10*3/uL (ref 150–400)
RBC: 5.3 MIL/uL (ref 4.22–5.81)
RDW: 13 % (ref 11.5–15.5)
WBC Count: 16.5 10*3/uL — ABNORMAL HIGH (ref 4.0–10.5)
nRBC: 0 % (ref 0.0–0.2)

## 2020-07-17 LAB — CMP (CANCER CENTER ONLY)
ALT: 29 U/L (ref 0–44)
AST: 22 U/L (ref 15–41)
Albumin: 3.7 g/dL (ref 3.5–5.0)
Alkaline Phosphatase: 80 U/L (ref 38–126)
Anion gap: 6 (ref 5–15)
BUN: 18 mg/dL (ref 8–23)
CO2: 27 mmol/L (ref 22–32)
Calcium: 9.3 mg/dL (ref 8.9–10.3)
Chloride: 107 mmol/L (ref 98–111)
Creatinine: 1.25 mg/dL — ABNORMAL HIGH (ref 0.61–1.24)
GFR, Estimated: 59 mL/min — ABNORMAL LOW (ref >60)
Glucose, Bld: 106 mg/dL — ABNORMAL HIGH (ref 70–99)
Potassium: 4.2 mmol/L (ref 3.5–5.1)
Sodium: 140 mmol/L (ref 135–145)
Total Bilirubin: 0.8 mg/dL (ref 0.3–1.2)
Total Protein: 6.6 g/dL (ref 6.5–8.1)

## 2020-07-17 NOTE — Progress Notes (Signed)
Samuel Stephens 07/17/20  Samuel Stephens, Samuel Halsted, MD [address] [place] [address]    DIAGNOSIS: 79 year old man with CLL diagnosed in 2002.  He was found to have lymphocytosis and adenopathy at that time.   CURRENT THERAPY: Active surveillance.  He has not required treatment.  INTERVAL HISTORY: Samuel Stephens presents today for routine evaluation.  Since the last visit, he reports no major changes in his health.  He does report chronic lower extremity swelling and uses furosemide as needed.  He denies any recent hospitalization or illnesses.  He denies any lymphadenopathy or constitutional symptoms.       ALLERGIES:  has No Known Allergies.  MEDICATIONS: Unchanged on review.  Current Outpatient Medications on File Prior to Visit  Medication Sig Dispense Refill  . clopidogrel (PLAVIX) 75 MG tablet Take 1 tablet (75 mg total) by mouth daily. 90 tablet 1  . furosemide (LASIX) 40 MG tablet TAKE 1 TABLET BY MOUTH EVERY DAY 90 tablet 0  . Omega-3 Fatty Acids (FISH OIL) 1200 MG CAPS Take 1,200 mg by mouth every evening.    . simvastatin (ZOCOR) 40 MG tablet TAKE 1 TABLET BY MOUTH EVERYDAY AT BEDTIME 90 tablet 1   No current facility-administered medications on file prior to visit.     PHYSICAL EXAMINATION: Blood pressure (!) 169/81, pulse 92, temperature 98.6 F (37 C), temperature source Tympanic, resp. rate 18, height 5\' 9"  (1.753 m), weight 197 lb (89.4 kg), SpO2 98 %.    ECOG PERFORMANCE STATUS: 0 - Asymptomatic   General appearance: Comfortable appearing without any discomfort Head: Normocephalic without any trauma Oropharynx: Mucous membranes are moist and pink without any thrush or ulcers. Eyes: Pupils are equal and round reactive to light. Lymph nodes: No cervical, supraclavicular, inguinal or axillary lymphadenopathy.   Heart:regular rate and rhythm.  S1 and S2.  Ankle edema noted bilaterally. Lung: Clear without  any rhonchi or wheezes.  No dullness to percussion. Abdomin: Soft, nontender, nondistended with good bowel sounds.  No hepatosplenomegaly. Musculoskeletal: No joint deformity or effusion.  Full range of motion noted. Neurological: No deficits noted on motor, sensory and deep tendon reflex exam. Skin: No petechial rash or dryness.  Appeared moist.        LABORATORY DATA: CBC    Component Value Date/Time   WBC 12.5 (H) 09/16/2019 0925   RBC 5.31 09/16/2019 0925   HGB 15.4 09/16/2019 0925   HGB 15.0 07/19/2019 0948   HGB 14.6 07/17/2017 1259   HCT 46.3 09/16/2019 0925   HCT 43.4 07/17/2017 1259   PLT 154.0 09/16/2019 0925   PLT 172 07/19/2019 0948   PLT 154 07/17/2017 1259   MCV 87.3 09/16/2019 0925   MCV 85.8 07/17/2017 1259   MCH 29.6 07/19/2019 0948   MCHC 33.4 09/16/2019 0925   RDW 13.6 09/16/2019 0925   RDW 14.2 07/17/2017 1259   LYMPHSABS 8.3 (H) 09/16/2019 0925   LYMPHSABS 9.6 (H) 07/17/2017 1259   MONOABS 0.6 09/16/2019 0925   MONOABS 0.5 07/17/2017 1259   EOSABS 0.1 09/16/2019 0925   EOSABS 0.1 07/17/2017 1259   BASOSABS 0.0 09/16/2019 0925   BASOSABS 0.0 07/17/2017 1259      ASSESSMENT AND PLAN:  79 year old man with:  1.  CLL diagnosed in 2002 after presenting with lymphocytosis and adenopathy.   He continues to be on active surveillance and has not required treatment since his diagnosis.  The natural course of this disease was reviewed and indication  for treatment were discussed.  These indications that include worsening cytopenia, painful adenopathy and autoimmune considerations.  Laboratory data for the last 20 years were reviewed and showed overall stable counts and lymphocytosis.  I see no indication for treatment at this time for treatment and annual surveillance is recommended.  Options of treatment if it is necessary in the future include oral targeted therapy, chemotherapy among others.  At this time I recommended continued active  surveillance.  2.  Autoimmune cytopenias: Thrombocytopenia and hemolytic anemia could be consequences of CLL.  He has no issues reported at this time.  3.  Infectious disease considerations: It would be at increased risk for opportunistic infection because of CLL and continue to educate him about 12 signs and symptoms of infection.  4.  Covid vaccination considerations: He is up-to-date on his vaccination series.   5. Follow-up: In 1 year for a follow-up evaluation.  30  minutes were dedicated to this visit. The time was spent on reviewing laboratory data, discussing treatment options, discussing indication for treatment and answering questions regarding future plan.   Zola Button MD 07/17/20

## 2020-08-06 ENCOUNTER — Encounter: Payer: Self-pay | Admitting: Internal Medicine

## 2020-08-07 DIAGNOSIS — M6281 Muscle weakness (generalized): Secondary | ICD-10-CM | POA: Diagnosis not present

## 2020-08-07 DIAGNOSIS — M7062 Trochanteric bursitis, left hip: Secondary | ICD-10-CM | POA: Diagnosis not present

## 2020-08-07 NOTE — Telephone Encounter (Signed)
Pt called and has been scheduled

## 2020-08-09 ENCOUNTER — Other Ambulatory Visit: Payer: Self-pay

## 2020-08-09 ENCOUNTER — Ambulatory Visit (INDEPENDENT_AMBULATORY_CARE_PROVIDER_SITE_OTHER): Payer: Medicare Other | Admitting: Internal Medicine

## 2020-08-09 ENCOUNTER — Encounter: Payer: Self-pay | Admitting: Internal Medicine

## 2020-08-09 VITALS — BP 140/70 | HR 96 | Temp 98.2°F | Wt 197.3 lb

## 2020-08-09 DIAGNOSIS — R351 Nocturia: Secondary | ICD-10-CM | POA: Diagnosis not present

## 2020-08-09 DIAGNOSIS — N401 Enlarged prostate with lower urinary tract symptoms: Secondary | ICD-10-CM | POA: Diagnosis not present

## 2020-08-09 DIAGNOSIS — R300 Dysuria: Secondary | ICD-10-CM | POA: Diagnosis not present

## 2020-08-09 LAB — POCT URINALYSIS DIPSTICK
Bilirubin, UA: NEGATIVE
Blood, UA: NEGATIVE
Glucose, UA: NEGATIVE
Ketones, UA: NEGATIVE
Leukocytes, UA: NEGATIVE
Nitrite, UA: NEGATIVE
Protein, UA: NEGATIVE
Spec Grav, UA: 1.025 (ref 1.010–1.025)
Urobilinogen, UA: 0.2 E.U./dL
pH, UA: 5.5 (ref 5.0–8.0)

## 2020-08-09 MED ORDER — TAMSULOSIN HCL 0.4 MG PO CAPS: 0.4000 mg | ORAL_CAPSULE | Freq: Every day | ORAL | 1 refills | Status: DC

## 2020-08-09 NOTE — Patient Instructions (Signed)
-  Nice seeing you today!!  -Lab work today; will notify you once results are available.  -Start flomax 0.4 mg at bedtime.  -Schedule follow up in 3 months or sooner if possible for your physical. Please come in fasting that day.

## 2020-08-09 NOTE — Progress Notes (Signed)
Established Patient Office Visit     This visit occurred during the SARS-CoV-2 public health emergency.  Safety protocols were in place, including screening questions prior to the visit, additional usage of staff PPE, and extensive cleaning of exam room while observing appropriate contact time as indicated for disinfecting solutions.    CC/Reason for Visit: Dysuria, frequency, nocturia  HPI: Samuel Stephens is a 79 y.o. male who is coming in today for the above mentioned reasons. 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  Since last week he has noticed an increase in nocturnal voiding sessions.  It used to be once or twice a night and is now around 3-4 times a night.  Over the weekend he started noticing some burning and thought he needed to come in to make sure he did not have a UTI.  He does have a history of BPH in his chart although he has never been on medication for it.  He denies any fever, abdominal or flank pain.  He has started noticing a weaker urinary stream with dribbling and hesitancy.   Past Medical/Surgical History: Past Medical History:  Diagnosis Date  . CKD (chronic kidney disease)   . CLL (chronic lymphoblastic leukemia) 2002  . 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 Glucose intolerance (impaired glucose tolerance)   . Hyperlipidemia   . Pedal edema     Past Surgical History:  Procedure Laterality Date  . COLONOSCOPY W/ BIOPSIES     multiple  . TEE WITHOUT CARDIOVERSION  09/16/2012   Procedure: TRANSESOPHAGEAL ECHOCARDIOGRAM (TEE);  Surgeon: Larey Dresser, MD;  Location: [place];  Service: Cardiovascular;  Laterality: N/A;    Social History:  reports that he has never smoked. He has never used smokeless tobacco. He reports current alcohol use. He reports that he does not use drugs.  Allergies: No Known Allergies  Family History:  Family History   Problem Relation Age of Onset  . Stroke Mother   . Uterine cancer Mother   . Thyroid cancer Mother   . Stroke Father   . Diabetes Father   . Colon cancer Neg Hx   . Stomach cancer Neg Hx      Current Outpatient Medications:  .  clopidogrel (PLAVIX) 75 MG tablet, Take 1 tablet (75 mg total) by mouth daily., Disp: 90 tablet, Rfl: 1 .  furosemide (LASIX) 40 MG tablet, TAKE 1 TABLET BY MOUTH EVERY DAY, Disp: 90 tablet, Rfl: 0 .  Omega-3 Fatty Acids (FISH OIL) 1200 MG CAPS, Take 1,200 mg by mouth every evening., Disp: , Rfl:  .  simvastatin (ZOCOR) 40 MG tablet, TAKE 1 TABLET BY MOUTH EVERYDAY AT BEDTIME, Disp: 90 tablet, Rfl: 1 .  tamsulosin (FLOMAX) 0.4 MG CAPS capsule, Take 1 capsule (0.4 mg total) by mouth daily., Disp: 90 capsule, Rfl: 1  Review of Systems:  Constitutional: Denies fever, chills, diaphoresis, appetite change and fatigue.  HEENT: Denies photophobia, eye pain, redness, hearing loss, ear pain, congestion, sore throat, rhinorrhea, sneezing, mouth sores, trouble swallowing, neck pain, neck stiffness and tinnitus.   Respiratory: Denies SOB, DOE, cough, chest tightness,  and wheezing.   Cardiovascular: Denies chest pain, palpitations and leg swelling.  Gastrointestinal: Denies nausea, vomiting, abdominal pain, diarrhea, constipation, blood in stool and abdominal distention.  Genitourinary: Denies and difficulty urinating.  Endocrine: Denies: hot or cold intolerance, sweats, changes in hair or  nails, polyuria, polydipsia. Musculoskeletal: Denies myalgias, back pain, joint swelling, arthralgias and gait problem.  Skin: Denies pallor, rash and wound.  Neurological: Denies dizziness, seizures, syncope, weakness, light-headedness, numbness and headaches.  Hematological: Denies adenopathy. Easy bruising, personal or family bleeding history  Psychiatric/Behavioral: Denies suicidal ideation, mood changes, confusion, nervousness, sleep disturbance and agitation    Physical  Exam: Vitals:   08/09/20 0801  BP: 140/70  Pulse: 96  Temp: 98.2 F (36.8 C)  TempSrc: Oral  SpO2: 99%  Weight: 197 lb 4.8 oz (89.5 kg)    Body mass index is 29.14 kg/m.   Constitutional: NAD, calm, comfortable Eyes: PERRL, lids and conjunctivae normal, wears corrective lenses ENMT: Mucous membranes are moist. Abdomen: no tenderness, no masses palpated. No hepatosplenomegaly. Bowel sounds positive.   Neurologic: Grossly intact and nonfocal Psychiatric: Normal judgment and insight. Alert and oriented x 3. Normal mood.    Impression and Plan:  Dysuria Benign prostatic hyperplasia with nocturia -Urine dipstick in office negative for infection. -Symptoms sound more consistent with BPH. -Check PSA. -Have recommended we start Flomax 0.4 mg at bedtime. -He will let us know in a few weeks how he is doing.   Patient Instructions  -Nice seeing you today!!  -Lab work today; will notify you once results are available.  -Start flomax 0.4 mg at bedtime.  -Schedule follow up in 3 months or sooner if possible for your physical. Please come in fasting that day.     Lelon Frohlich, MD [facility]

## 2020-08-15 ENCOUNTER — Encounter: Payer: Self-pay | Admitting: Internal Medicine

## 2020-08-21 ENCOUNTER — Other Ambulatory Visit: Payer: Self-pay

## 2020-08-22 ENCOUNTER — Other Ambulatory Visit: Payer: Self-pay

## 2020-08-22 ENCOUNTER — Encounter: Payer: Self-pay | Admitting: Family Medicine

## 2020-08-22 ENCOUNTER — Ambulatory Visit (INDEPENDENT_AMBULATORY_CARE_PROVIDER_SITE_OTHER): Payer: Medicare Other | Admitting: Family Medicine

## 2020-08-22 VITALS — BP 142/78 | HR 102 | Temp 98.0°F | Ht 69.0 in | Wt 197.5 lb

## 2020-08-22 DIAGNOSIS — H6121 Impacted cerumen, right ear: Secondary | ICD-10-CM | POA: Diagnosis not present

## 2020-08-22 NOTE — Progress Notes (Signed)
Established Patient Office Visit  Subjective:  Patient ID: Samuel Stephens, male    DOB: Mar 18, 1941  Age: 79 y.o. MRN: [id number]  CC:  Chief Complaint  Patient presents with  . Cerumen Impaction    Ear wax removed both ears.     HPI [name] presents for concern for cerumen in both ears.  He has longstanding history of hearing loss and has bilateral hearing aids.  He went to his audiologist back in September and they had recommended he have his ears cleaned out.  He has not done so until now.  He has had these irrigated previously.  He does though apparently have chronic perforation of the left eardrum.  No drainage.  No ear pain.  Past Medical History:  Diagnosis Date  . CKD (chronic kidney disease)   . CLL (chronic lymphoblastic leukemia) 2002  . 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 Glucose intolerance (impaired glucose tolerance)   . Hyperlipidemia   . Pedal edema     Past Surgical History:  Procedure Laterality Date  . COLONOSCOPY W/ BIOPSIES     multiple  . TEE WITHOUT CARDIOVERSION  09/16/2012   Procedure: TRANSESOPHAGEAL ECHOCARDIOGRAM (TEE);  Surgeon: Larey Dresser, MD;  Location: [facility];  Service: Cardiovascular;  Laterality: N/A;    Family History  Problem Relation Age of Onset  . Stroke Mother   . Uterine cancer Mother   . Thyroid cancer Mother   . Stroke Father   . Diabetes Father   . Colon cancer Neg Hx   . Stomach cancer Neg Hx     Social History   Socioeconomic History  . Marital status: Married    Spouse name: Not on file  . Number of children: 3  . Years of education: masters  . Highest education level: Not on file  Occupational History  . Occupation: retired    Fish farm manager: RETIRED  Tobacco Use  . Smoking status: Never Smoker  . Smokeless tobacco: Never Used  Vaping Use  . Vaping Use: Never used  Substance and Sexual Activity  . Alcohol use: Yes    Alcohol/week: 0.0 standard drinks    Comment:  occassional/ a beer with every meal  . Drug use: No  . Sexual activity: Yes  Other Topics Concern  . Not on file  Social History Narrative  . Not on file   Social Determinants of Health   Financial Resource Strain: Not on file  Food Insecurity: Not on file  Transportation Needs: Not on file  Physical Activity: Not on file  Stress: Not on file  Social Connections: Not on file  Intimate Partner Violence: Not on file    Outpatient Medications Prior to Visit  Medication Sig Dispense Refill  . clopidogrel (PLAVIX) 75 MG tablet Take 1 tablet (75 mg total) by mouth daily. 90 tablet 1  . furosemide (LASIX) 40 MG tablet TAKE 1 TABLET BY MOUTH EVERY DAY 90 tablet 0  . Omega-3 Fatty Acids (FISH OIL) 1200 MG CAPS Take 1,200 mg by mouth every evening.    . simvastatin (ZOCOR) 40 MG tablet TAKE 1 TABLET BY MOUTH EVERYDAY AT BEDTIME 90 tablet 1  . tamsulosin (FLOMAX) 0.4 MG CAPS capsule Take 1 capsule (0.4 mg total) by mouth daily. (Patient not taking: Reported on 08/22/2020) 90 capsule 1   No facility-administered medications prior to visit.    No Known Allergies  ROS Review of Systems  Constitutional: Negative for chills  and fever.  HENT: Negative for ear discharge and ear pain.   Neurological: Negative for dizziness.      Objective:    Physical Exam Vitals reviewed.  HENT:     Ears:     Comments: He does have significant cerumen impacting the right canal.  He has minimal cerumen in the left canal this is mostly soft to almost liquid consistency.  He has evidence for chronic left eardrum perforation. Cardiovascular:     Rate and Rhythm: Normal rate and regular rhythm.     BP (!) 142/78   Pulse (!) 102   Temp 98 F (36.7 C) (Temporal)   Ht _0  (1.753 m)   Wt 197 lb 8 oz (89.6 kg)   SpO2 98%   BMI 29.17 kg/m  Wt Readings from Last 3 Encounters:  08/22/20 197 lb 8 oz (89.6 kg)  08/09/20 197 lb 4.8 oz (89.5 kg)  07/17/20 197 lb (89.4 kg)     Health Maintenance Due   Topic Date Due  . COLONOSCOPY  01/28/2019    There are no preventive care reminders to display for this patient.  Lab Results  Component Value Date   TSH 3.05 09/16/2019   Lab Results  Component Value Date   WBC 16.5 (H) 07/17/2020   HGB 15.6 07/17/2020   HCT 46.1 07/17/2020   MCV 87.0 07/17/2020   PLT 188 07/17/2020   Lab Results  Component Value Date   NA 140 07/17/2020   K 4.2 07/17/2020   CHLORIDE 110 (H) 07/17/2017   CO2 27 07/17/2020   GLUCOSE 106 (H) 07/17/2020   BUN 18 07/17/2020   CREATININE 1.25 (H) 07/17/2020   BILITOT 0.8 07/17/2020   ALKPHOS 80 07/17/2020   AST 22 07/17/2020   ALT 29 07/17/2020   PROT 6.6 07/17/2020   ALBUMIN 3.7 07/17/2020   CALCIUM 9.3 07/17/2020   ANIONGAP 6 07/17/2020   EGFR >60 07/17/2017   GFR 56.41 (L) 09/16/2019   Lab Results  Component Value Date   CHOL 171 09/16/2019   Lab Results  Component Value Date   HDL 45.90 09/16/2019   Lab Results  Component Value Date   LDLCALC 104 (H) 09/16/2019   Lab Results  Component Value Date   TRIG 104.0 09/16/2019   Lab Results  Component Value Date   CHOLHDL 4 09/16/2019   Lab Results  Component Value Date   HGBA1C 5.7 09/16/2019      Assessment & Plan:   Cerumen impaction right canal.  We discussed removal with curette including risk of pain, bleeding, low risk of eardrum trauma.  Patient consented.  Were able to remove several large pieces of cerumen right canal and patient tolerated well.  No orders of the defined types were placed in this encounter.   Follow-up: No follow-ups on file.    Carolann Littler, MD

## 2020-08-22 NOTE — Patient Instructions (Signed)
Earwax Buildup, Adult The ears produce a substance called earwax that helps keep bacteria out of the ear and protects the skin in the ear canal. Occasionally, earwax can build up in the ear and cause discomfort or hearing loss. What increases the risk? This condition is more likely to develop in people who:  Are male.  Are elderly.  Naturally produce more earwax.  Clean their ears often with cotton swabs.  Use earplugs often.  Use in-ear headphones often.  Wear hearing aids.  Have narrow ear canals.  Have earwax that is overly thick or sticky.  Have eczema.  Are dehydrated.  Have excess hair in the ear canal. What are the signs or symptoms? Symptoms of this condition include:  Reduced or muffled hearing.  A feeling of fullness in the ear or feeling that the ear is plugged.  Fluid coming from the ear.  Ear pain.  Ear itch.  Ringing in the ear.  Coughing.  An obvious piece of earwax that can be seen inside the ear canal. How is this diagnosed? This condition may be diagnosed based on:  Your symptoms.  Your medical history.  An ear exam. During the exam, your health care provider will look into your ear with an instrument called an otoscope. You may have tests, including a hearing test. How is this treated? This condition may be treated by:  Using ear drops to soften the earwax.  Having the earwax removed by a health care provider. The health care provider may: ? Flush the ear with water. ? Use an instrument that has a loop on the end (curette). ? Use a suction device.  Surgery to remove the wax buildup. This may be done in severe cases. Follow these instructions at home:   Take over-the-counter and prescription medicines only as told by your health care provider.  Do not put any objects, including cotton swabs, into your ear. You can clean the opening of your ear canal with a washcloth or facial tissue.  Follow instructions from your health care  provider about cleaning your ears. Do not over-clean your ears.  Drink enough fluid to keep your urine clear or pale yellow. This will help to thin the earwax.  Keep all follow-up visits as told by your health care provider. If earwax builds up in your ears often or if you use hearing aids, consider seeing your health care provider for routine, preventive ear cleanings. Ask your health care provider how often you should schedule your cleanings.  If you have hearing aids, clean them according to instructions from the manufacturer and your health care provider. Contact a health care provider if:  You have ear pain.  You develop a fever.  You have blood, pus, or other fluid coming from your ear.  You have hearing loss.  You have ringing in your ears that does not go away.  Your symptoms do not improve with treatment.  You feel like the room is spinning (vertigo). Summary  Earwax can build up in the ear and cause discomfort or hearing loss.  The most common symptoms of this condition include reduced or muffled hearing and a feeling of fullness in the ear or feeling that the ear is plugged.  This condition may be diagnosed based on your symptoms, your medical history, and an ear exam.  This condition may be treated by using ear drops to soften the earwax or by having the earwax removed by a health care provider.  Do not put any   objects, including cotton swabs, into your ear. You can clean the opening of your ear canal with a washcloth or facial tissue. This information is not intended to replace advice given to you by your health care provider. Make sure you discuss any questions you have with your health care provider. Document Revised: 07/31/2017 Document Reviewed: 10/29/2016 [employer]vier Patient Education  2020 [employer]  

## 2020-09-27 ENCOUNTER — Other Ambulatory Visit: Payer: Self-pay | Admitting: Internal Medicine

## 2020-09-27 DIAGNOSIS — N1831 Chronic kidney disease, stage 3a: Secondary | ICD-10-CM

## 2020-10-06 ENCOUNTER — Encounter: Payer: Self-pay | Admitting: Internal Medicine

## 2020-10-10 DIAGNOSIS — H2513 Age-related nuclear cataract, bilateral: Secondary | ICD-10-CM | POA: Diagnosis not present

## 2020-10-10 DIAGNOSIS — H353132 Nonexudative age-related macular degeneration, bilateral, intermediate dry stage: Secondary | ICD-10-CM | POA: Diagnosis not present

## 2020-10-16 DIAGNOSIS — M7062 Trochanteric bursitis, left hip: Secondary | ICD-10-CM | POA: Diagnosis not present

## 2020-10-16 DIAGNOSIS — M25552 Pain in left hip: Secondary | ICD-10-CM | POA: Diagnosis not present

## 2020-10-18 ENCOUNTER — Other Ambulatory Visit: Payer: Self-pay | Admitting: Internal Medicine

## 2020-10-24 ENCOUNTER — Ambulatory Visit (INDEPENDENT_AMBULATORY_CARE_PROVIDER_SITE_OTHER): Payer: Medicare Other | Admitting: Internal Medicine

## 2020-10-24 ENCOUNTER — Other Ambulatory Visit: Payer: Self-pay

## 2020-10-24 ENCOUNTER — Encounter: Payer: Self-pay | Admitting: Internal Medicine

## 2020-10-24 VITALS — BP 120/78 | HR 89 | Temp 97.8°F | Ht 69.0 in | Wt 193.8 lb

## 2020-10-24 DIAGNOSIS — E559 Vitamin D deficiency, unspecified: Secondary | ICD-10-CM | POA: Diagnosis not present

## 2020-10-24 DIAGNOSIS — C9110 Chronic lymphocytic leukemia of B-cell type not having achieved remission: Secondary | ICD-10-CM | POA: Diagnosis not present

## 2020-10-24 DIAGNOSIS — N1831 Chronic kidney disease, stage 3a: Secondary | ICD-10-CM | POA: Diagnosis not present

## 2020-10-24 DIAGNOSIS — R7302 Impaired glucose tolerance (oral): Secondary | ICD-10-CM

## 2020-10-24 DIAGNOSIS — E785 Hyperlipidemia, unspecified: Secondary | ICD-10-CM | POA: Diagnosis not present

## 2020-10-24 DIAGNOSIS — N400 Enlarged prostate without lower urinary tract symptoms: Secondary | ICD-10-CM

## 2020-10-24 DIAGNOSIS — Z0000 Encounter for general adult medical examination without abnormal findings: Secondary | ICD-10-CM | POA: Diagnosis not present

## 2020-10-24 DIAGNOSIS — I639 Cerebral infarction, unspecified: Secondary | ICD-10-CM

## 2020-10-24 LAB — CBC WITH DIFFERENTIAL/PLATELET
Basophils Absolute: 0 10*3/uL (ref 0.0–0.1)
Basophils Relative: 0.3 % (ref 0.0–3.0)
Eosinophils Absolute: 0.2 10*3/uL (ref 0.0–0.7)
Eosinophils Relative: 1.3 % (ref 0.0–5.0)
HCT: 45.6 % (ref 39.0–52.0)
Hemoglobin: 15.3 g/dL (ref 13.0–17.0)
Lymphocytes Relative: 60.9 % — ABNORMAL HIGH (ref 12.0–46.0)
Lymphs Abs: 8.3 10*3/uL — ABNORMAL HIGH (ref 0.7–4.0)
MCHC: 33.5 g/dL (ref 30.0–36.0)
MCV: 86.3 fl (ref 78.0–100.0)
Monocytes Absolute: 0.7 10*3/uL (ref 0.1–1.0)
Monocytes Relative: 5.4 % (ref 3.0–12.0)
Neutro Abs: 4.4 10*3/uL (ref 1.4–7.7)
Neutrophils Relative %: 32.1 % — ABNORMAL LOW (ref 43.0–77.0)
Platelets: 186 10*3/uL (ref 150.0–400.0)
RBC: 5.29 Mil/uL (ref 4.22–5.81)
RDW: 13.6 % (ref 11.5–15.5)
WBC: 13.6 10*3/uL — ABNORMAL HIGH (ref 4.0–10.5)

## 2020-10-24 LAB — LIPID PANEL
Cholesterol: 198 mg/dL (ref 0–200)
HDL: 59.8 mg/dL (ref >39.00)
LDL Cholesterol: 114 mg/dL — ABNORMAL HIGH (ref 0–99)
NonHDL: 138.19
Total CHOL/HDL Ratio: 3
Triglycerides: 119 mg/dL (ref 0.0–149.0)
VLDL: 23.8 mg/dL (ref 0.0–40.0)

## 2020-10-24 LAB — COMPREHENSIVE METABOLIC PANEL
ALT: 17 U/L (ref 0–53)
AST: 16 U/L (ref 0–37)
Albumin: 4.2 g/dL (ref 3.5–5.2)
Alkaline Phosphatase: 69 U/L (ref 39–117)
BUN: 22 mg/dL (ref 6–23)
CO2: 29 mEq/L (ref 19–32)
Calcium: 9.4 mg/dL (ref 8.4–10.5)
Chloride: 103 mEq/L (ref 96–112)
Creatinine, Ser: 1.33 mg/dL (ref 0.40–1.50)
GFR: 51.06 mL/min — ABNORMAL LOW (ref >60.00)
Glucose, Bld: 108 mg/dL — ABNORMAL HIGH (ref 70–99)
Potassium: 4.2 mEq/L (ref 3.5–5.1)
Sodium: 138 mEq/L (ref 135–145)
Total Bilirubin: 0.9 mg/dL (ref 0.2–1.2)
Total Protein: 6.5 g/dL (ref 6.0–8.3)

## 2020-10-24 LAB — HEMOGLOBIN A1C: Hgb A1c MFr Bld: 5.9 % (ref 4.6–6.5)

## 2020-10-24 LAB — VITAMIN B12: Vitamin B-12: 559 pg/mL (ref 211–911)

## 2020-10-24 LAB — TSH: TSH: 3.24 u[IU]/mL (ref 0.35–4.50)

## 2020-10-24 LAB — VITAMIN D 25 HYDROXY (VIT D DEFICIENCY, FRACTURES): VITD: 57.51 ng/mL (ref 30.00–100.00)

## 2020-10-24 NOTE — Progress Notes (Signed)
Established Patient Office Visit     This visit occurred during the SARS-CoV-2 public health emergency.  Safety protocols were in place, including screening questions prior to the visit, additional usage of staff PPE, and extensive cleaning of exam room while observing appropriate contact time as indicated for disinfecting solutions.    CC/Reason for Visit: Annual preventive exam and subsequent Medicare wellness visit  HPI: Samuel Stephens is a 80 y.o. male who is coming in today for the above mentioned reasons. 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  He feels well and has no acute complaints today other than some chronic right hamstring pain for which she has been doing physical therapy.  He has routine eye and dental care.  He tells me that he is scheduled to have cataract surgery soon.  All immunizations are up-to-date.  He had a colonoscopy in 2015 and was told that was his last one.   Past Medical/Surgical History: Past Medical History:  Diagnosis Date  . CKD (chronic kidney disease)   . CLL (chronic lymphoblastic leukemia) 2002  . 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 Glucose intolerance (impaired glucose tolerance)   . Hyperlipidemia   . Pedal edema     Past Surgical History:  Procedure Laterality Date  . COLONOSCOPY W/ BIOPSIES     multiple  . TEE WITHOUT CARDIOVERSION  09/16/2012   Procedure: TRANSESOPHAGEAL ECHOCARDIOGRAM (TEE);  Surgeon: Larey Dresser, MD;  Location: [facility];  Service: Cardiovascular;  Laterality: N/A;    Social History:  reports that he has never smoked. He has never used smokeless tobacco. He reports current alcohol use. He reports that he does not use drugs.  Allergies: No Known Allergies  Family History:  Family History  Problem Relation Age of Onset  . Stroke Mother   . Uterine cancer Mother   . Thyroid cancer Mother   . Stroke  Father   . Diabetes Father   . Colon cancer Neg Hx   . Stomach cancer Neg Hx      Current Outpatient Medications:  .  clopidogrel (PLAVIX) 75 MG tablet, TAKE 1 TABLET BY MOUTH EVERY DAY, Disp: 90 tablet, Rfl: 0 .  furosemide (LASIX) 40 MG tablet, TAKE 1 TABLET BY MOUTH EVERY DAY, Disp: 90 tablet, Rfl: 0 .  Multiple Vitamins-Minerals (EYE VITAMINS PO), Take by mouth., Disp: , Rfl:  .  Omega-3 Fatty Acids (FISH OIL) 1200 MG CAPS, Take 1,200 mg by mouth every evening., Disp: , Rfl:  .  simvastatin (ZOCOR) 40 MG tablet, TAKE 1 TABLET BY MOUTH EVERYDAY AT BEDTIME, Disp: 90 tablet, Rfl: 1 .  tamsulosin (FLOMAX) 0.4 MG CAPS capsule, Take 1 capsule (0.4 mg total) by mouth daily., Disp: 90 capsule, Rfl: 1  Review of Systems:  Constitutional: Denies fever, chills, diaphoresis, appetite change and fatigue.  HEENT: Denies photophobia, eye pain, redness, hearing loss, ear pain, congestion, sore throat, rhinorrhea, sneezing, mouth sores, trouble swallowing, neck pain, neck stiffness and tinnitus.   Respiratory: Denies SOB, DOE, cough, chest tightness,  and wheezing.   Cardiovascular: Denies chest pain, palpitations and leg swelling.  Gastrointestinal: Denies nausea, vomiting, abdominal pain, diarrhea, constipation, blood in stool and abdominal distention.  Genitourinary: Denies dysuria, urgency, frequency, hematuria, flank pain and difficulty urinating.  Endocrine: Denies: hot or cold intolerance, sweats, changes in hair or nails, polyuria, polydipsia. Musculoskeletal: Denies myalgias, back pain, joint  swelling, arthralgias and gait problem.  Skin: Denies pallor, rash and wound.  Neurological: Denies dizziness, seizures, syncope, weakness, light-headedness, numbness and headaches.  Hematological: Denies adenopathy. Easy bruising, personal or family bleeding history  Psychiatric/Behavioral: Denies suicidal ideation, mood changes, confusion, nervousness, sleep disturbance and agitation    Physical  Exam: Vitals:   10/24/20 0825  BP: 120/78  Pulse: 89  Temp: 97.8 F (36.6 C)  TempSrc: Oral  SpO2: 94%  Weight: 193 lb 12.8 oz (87.9 kg)  Height: 5\' 9"  (1.753 m)    Body mass index is 28.62 kg/m.   Constitutional: NAD, calm, comfortable Eyes: PERRL, lids and conjunctivae normal, wears corrective lenses ENMT: Mucous membranes are moist. Posterior pharynx clear of any exudate or lesions. Normal dentition. Tympanic membrane is pearly white, no erythema or bulging. Neck: normal, supple, no masses, no thyromegaly Respiratory: clear to auscultation bilaterally, no wheezing, no crackles. Normal respiratory effort. No accessory muscle use.  Cardiovascular: Regular rate and rhythm, no murmurs / rubs / gallops. No extremity edema. 2+ pedal pulses. No carotid bruits.  Abdomen: no tenderness, no masses palpated. No hepatosplenomegaly. Bowel sounds positive.  Musculoskeletal: no clubbing / cyanosis. No joint deformity upper and lower extremities. Good ROM, no contractures. Normal muscle tone.  Skin: no rashes, lesions, ulcers. No induration Neurologic: CN 2-12 grossly intact. Sensation intact, DTR normal. Strength 5/5 in all 4.  Psychiatric: Normal judgment and insight. Alert and oriented x 3. Normal mood.    Subsequent Medicare wellness visit   1. Risk factors, based on past  M,S,F -cardiovascular disease risk factors include age, gender, history of hyperlipidemia, prior CVA   2.  Physical activities: He does resistance bands and bicycle at least 3 times a week.   3.  Depression/mood:  Stable, not depressed   4.  Hearing:  Wears bilateral hearing aids, sees his audiologist every 6 months   5.  ADL's: Independent in all ADLs   6.  Fall risk:  Low fall risk   7.  Home safety: No problems identified   8.  Height weight, and visual acuity: height and weight as above, vision:   Visual Acuity Screening   Right eye Left eye Both eyes  Without correction: 20/40 20/32 20/40   With  correction:        9.  Counseling:  Advised that he increase his physical activity to at least 30 minutes 3 to 4 days a week.   10. Lab orders based on risk factors: Laboratory update will be reviewed   11. Referral :  None today   12. Care plan:  Follow-up with me in 6 months   13. Cognitive assessment:  No cognitive impairment   14. Screening: Patient provided with a written and personalized 5-10 year screening schedule in the AVS.   yes   15. Provider List Update:   PCP, ophthalmologist  16. Advance Directives: Full code   17. Opioids: Patient is not on any opioid prescriptions and has no risk factors for a substance use disorder.   [place] Office Visit from 10/24/2020 in [facility] at [place]  PHQ-9 Total Score 1      Fall Risk  10/24/2020 08/22/2020 09/16/2019 07/20/2018 02/17/2018  Falls in the past year? 0 0 0 0 No  Number falls in past yr: 0 - 0 0 -  Injury with Fall? 0 - 0 0 -     Impression and Plan:  Encounter for preventive health examination  -He has routine eye and dental care. -  All immunizations are up-to-date including COVID and influenza. -Screening labs today. -Healthy lifestyle discussed in detail. -He had a colonoscopy in 2015, that was his last one.  Vitamin D deficiency  - Plan: VITAMIN D 25 Hydroxy (Vit-D Deficiency, Fractures)  Impaired glucose tolerance  - Plan: Hemoglobin A1c -Last A1c was 5.7.  Cerebral infarction, unspecified mechanism ([place]) -History of, noted.  Dyslipidemia  - Plan: Lipid panel -He is on simvastatin at that time.  Benign prostatic hyperplasia without lower urinary tract symptoms -Is no longer taking Flomax, he only has 1-2 episodes of nocturia.  Chronic lymphocytic leukemia ([place]) -Followed by hematology.  Stage 3a chronic kidney disease ([place]) -Stable, last known creatinine was 1.250 in November 2021.   Patient Instructions   -Nice seeing you today!!  -Lab work today; will notify you once  results are available.  -Schedule follow up in 6 months or sooner as needed.   Preventive Care 66 Years and Older, Male Preventive care refers to lifestyle choices and visits with your health care provider that can promote health and wellness. This includes:  A yearly physical exam. This is also called an annual wellness visit.  Regular dental and eye exams.  Immunizations.  Screening for certain conditions.  Healthy lifestyle choices, such as: ? Eating a healthy diet. ? Getting regular exercise. ? Not using drugs or products that contain nicotine and tobacco. ? Limiting alcohol use. What can I expect for my preventive care visit? Physical exam Your health care provider will check your:  Height and weight. These may be used to calculate your BMI (body mass index). BMI is a measurement that tells if you are at a healthy weight.  Heart rate and blood pressure.  Body temperature.  Skin for abnormal spots. Counseling Your health care provider may ask you questions about your:  Past medical problems.  Family's medical history.  Alcohol, tobacco, and drug use.  Emotional well-being.  Home life and relationship well-being.  Sexual activity.  Diet, exercise, and sleep habits.  History of falls.  Memory and ability to understand (cognition).  Work and work Statistician.  Access to firearms. What immunizations do I need? Vaccines are usually given at various ages, according to a schedule. Your health care provider will recommend vaccines for you based on your age, medical history, and lifestyle or other factors, such as travel or where you work.   What tests do I need? Blood tests  Lipid and cholesterol levels. These may be checked every 5 years, or more often depending on your overall health.  Hepatitis C test.  Hepatitis B test. Screening  Lung cancer screening. You may have this screening every year starting at age 15 if you have a 30-pack-year history of  smoking and currently smoke or have quit within the past 15 years.  Colorectal cancer screening. ? All adults should have this screening starting at age 45 and continuing until age 66. ? Your health care provider may recommend screening at age 22 if you are at increased risk. ? You will have tests every 1-10 years, depending on your results and the type of screening test.  Prostate cancer screening. Recommendations will vary depending on your family history and other risks.  Genital exam to check for testicular cancer or hernias.  Diabetes screening. ? This is done by checking your blood sugar (glucose) after you have not eaten for a while (fasting). ? You may have this done every 1-3 years.  Abdominal aortic aneurysm (AAA) screening. You may need  this if you are a current or former smoker.  STD (sexually transmitted disease) testing, if you are at risk. Follow these instructions at home: Eating and drinking  Eat a diet that includes fresh fruits and vegetables, whole grains, lean protein, and low-fat dairy products. Limit your intake of foods with high amounts of sugar, saturated fats, and salt.  Take vitamin and mineral supplements as recommended by your health care provider.  Do not drink alcohol if your health care provider tells you not to drink.  If you drink alcohol: ? Limit how much you have to 0-2 drinks a day. ? Be aware of how much alcohol is in your drink. In the [place] one drink equals one 12 oz bottle of beer (355 mL), one 5 oz glass of wine (148 mL), or one 1 oz glass of hard liquor (44 mL).   Lifestyle  Take daily care of your teeth and gums. Brush your teeth every morning and night with fluoride toothpaste. Floss one time each day.  Stay active. Exercise for at least 30 minutes 5 or more days each week.  Do not use any products that contain nicotine or tobacco, such as cigarettes, e-cigarettes, and chewing tobacco. If you need help quitting, ask your health care  provider.  Do not use drugs.  If you are sexually active, practice safe sex. Use a condom or other form of protection to prevent STIs (sexually transmitted infections).  Talk with your health care provider about taking a low-dose aspirin or statin.  Find healthy ways to cope with stress, such as: ? Meditation, yoga, or listening to music. ? Journaling. ? Talking to a trusted person. ? Spending time with friends and family. Safety  Always wear your seat belt while driving or riding in a vehicle.  Do not drive: ? If you have been drinking alcohol. Do not ride with someone who has been drinking. ? When you are tired or distracted. ? While texting.  Wear a helmet and other protective equipment during sports activities.  If you have firearms in your house, make sure you follow all gun safety procedures. What's next?  Visit your health care provider once a year for an annual wellness visit.  Ask your health care provider how often you should have your eyes and teeth checked.  Stay up to date on all vaccines. This information is not intended to replace advice given to you by your health care provider. Make sure you discuss any questions you have with your health care provider. Document Revised: 05/17/2019 Document Reviewed: 08/12/2018 Elsevier Patient Education  2021 [place], [place] [facility]

## 2020-10-24 NOTE — Addendum Note (Signed)
Addended by: Marrion Coy on: 10/24/2020 08:56 AM   Modules accepted: Orders

## 2020-10-24 NOTE — Patient Instructions (Signed)
-Nice seeing you today!!  -Lab work today; will notify you once results are available.  -Schedule follow up in 6 months or sooner as needed.   Preventive Care 79 Years and Older, Male Preventive care refers to lifestyle choices and visits with your health care provider that can promote health and wellness. This includes:  A yearly physical exam. This is also called an annual wellness visit.  Regular dental and eye exams.  Immunizations.  Screening for certain conditions.  Healthy lifestyle choices, such as: ? Eating a healthy diet. ? Getting regular exercise. ? Not using drugs or products that contain nicotine and tobacco. ? Limiting alcohol use. What can I expect for my preventive care visit? Physical exam Your health care provider will check your:  Height and weight. These may be used to calculate your BMI (body mass index). BMI is a measurement that tells if you are at a healthy weight.  Heart rate and blood pressure.  Body temperature.  Skin for abnormal spots. Counseling Your health care provider may ask you questions about your:  Past medical problems.  Family's medical history.  Alcohol, tobacco, and drug use.  Emotional well-being.  Home life and relationship well-being.  Sexual activity.  Diet, exercise, and sleep habits.  History of falls.  Memory and ability to understand (cognition).  Work and work environment.  Access to firearms. What immunizations do I need? Vaccines are usually given at various ages, according to a schedule. Your health care provider will recommend vaccines for you based on your age, medical history, and lifestyle or other factors, such as travel or where you work.   What tests do I need? Blood tests  Lipid and cholesterol levels. These may be checked every 5 years, or more often depending on your overall health.  Hepatitis C test.  Hepatitis B test. Screening  Lung cancer screening. You may have this screening  every year starting at age 55 if you have a 30-pack-year history of smoking and currently smoke or have quit within the past 15 years.  Colorectal cancer screening. ? All adults should have this screening starting at age 50 and continuing until age 75. ? Your health care provider may recommend screening at age 45 if you are at increased risk. ? You will have tests every 1-10 years, depending on your results and the type of screening test.  Prostate cancer screening. Recommendations will vary depending on your family history and other risks.  Genital exam to check for testicular cancer or hernias.  Diabetes screening. ? This is done by checking your blood sugar (glucose) after you have not eaten for a while (fasting). ? You may have this done every 1-3 years.  Abdominal aortic aneurysm (AAA) screening. You may need this if you are a current or former smoker.  STD (sexually transmitted disease) testing, if you are at risk. Follow these instructions at home: Eating and drinking  Eat a diet that includes fresh fruits and vegetables, whole grains, lean protein, and low-fat dairy products. Limit your intake of foods with high amounts of sugar, saturated fats, and salt.  Take vitamin and mineral supplements as recommended by your health care provider.  Do not drink alcohol if your health care provider tells you not to drink.  If you drink alcohol: ? Limit how much you have to 0-2 drinks a day. ? Be aware of how much alcohol is in your drink. In the [place] one drink equals one 12 oz bottle of beer (355 mL),   one 5 oz glass of wine (148 mL), or one 1 oz glass of hard liquor (44 mL).   Lifestyle  Take daily care of your teeth and gums. Brush your teeth every morning and night with fluoride toothpaste. Floss one time each day.  Stay active. Exercise for at least 30 minutes 5 or more days each week.  Do not use any products that contain nicotine or tobacco, such as cigarettes, e-cigarettes,  and chewing tobacco. If you need help quitting, ask your health care provider.  Do not use drugs.  If you are sexually active, practice safe sex. Use a condom or other form of protection to prevent STIs (sexually transmitted infections).  Talk with your health care provider about taking a low-dose aspirin or statin.  Find healthy ways to cope with stress, such as: ? Meditation, yoga, or listening to music. ? Journaling. ? Talking to a trusted person. ? Spending time with friends and family. Safety  Always wear your seat belt while driving or riding in a vehicle.  Do not drive: ? If you have been drinking alcohol. Do not ride with someone who has been drinking. ? When you are tired or distracted. ? While texting.  Wear a helmet and other protective equipment during sports activities.  If you have firearms in your house, make sure you follow all gun safety procedures. What's next?  Visit your health care provider once a year for an annual wellness visit.  Ask your health care provider how often you should have your eyes and teeth checked.  Stay up to date on all vaccines. This information is not intended to replace advice given to you by your health care provider. Make sure you discuss any questions you have with your health care provider. Document Revised: 05/17/2019 Document Reviewed: 08/12/2018 Elsevier Patient Education  2021 [employer]  

## 2020-12-11 ENCOUNTER — Ambulatory Visit (INDEPENDENT_AMBULATORY_CARE_PROVIDER_SITE_OTHER): Payer: Medicare Other | Admitting: Otolaryngology

## 2020-12-11 ENCOUNTER — Other Ambulatory Visit: Payer: Self-pay

## 2020-12-11 DIAGNOSIS — H7202 Central perforation of tympanic membrane, left ear: Secondary | ICD-10-CM | POA: Diagnosis not present

## 2020-12-11 DIAGNOSIS — H90A12 Conductive hearing loss, unilateral, left ear with restricted hearing on the contralateral side: Secondary | ICD-10-CM

## 2020-12-11 DIAGNOSIS — H6123 Impacted cerumen, bilateral: Secondary | ICD-10-CM

## 2020-12-11 DIAGNOSIS — H903 Sensorineural hearing loss, bilateral: Secondary | ICD-10-CM

## 2020-12-11 NOTE — Progress Notes (Signed)
HPI: Samuel Stephens is a 80 y.o. male who presents for evaluation of gradual decline of hearing in the left ear..  He has a chronic left TM perforation.  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  He is [facility] in [place].  He brings with him to hearing test from [facility] performed in April 2021 and April 2022.  On review of these.  He has had slight progression of the conductive component in the left ear compared to the previous hearing test a year ago.  His SRT progressed from 55 dB a year ago to 75 dB on the left side only.  His right SRT was 55 dB on both test.  In addition his WR S worsened from 80% to 40%. He denies any drainage from his left ear over the past year.  He has noticed little bit more difficulty hearing on the left side with his present hearing aid.  Past Medical History:  Diagnosis Date  . CKD (chronic kidney disease)   . CLL (chronic lymphoblastic leukemia) 2002  . 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 Glucose intolerance (impaired glucose tolerance)   . Hyperlipidemia   . Pedal edema    Past Surgical History:  Procedure Laterality Date  . COLONOSCOPY W/ BIOPSIES     multiple  . TEE WITHOUT CARDIOVERSION  09/16/2012   Procedure: TRANSESOPHAGEAL ECHOCARDIOGRAM (TEE);  Surgeon: Larey Dresser, MD;  Location: [facility] ENDOSCOPY;  Service: Cardiovascular;  Laterality: N/A;   Social History   Socioeconomic History  . Marital status: Married    Spouse name: Not on file  . Number of children: 3  . Years of education: masters  . Highest education level: Not on file  Occupational History  . Occupation: retired    Fish farm manager: RETIRED  Tobacco Use  . Smoking status: Never Smoker  . Smokeless tobacco: Never Used  Vaping Use  . Vaping Use: Never used  Substance and Sexual Activity  . Alcohol use: Yes    Alcohol/week: 0.0 standard drinks    Comment: occassional/ a beer with  every meal  . Drug use: No  . Sexual activity: Yes  Other Topics Concern  . Not on file  Social History Narrative  . Not on file   Social Determinants of Health   Financial Resource Strain: Not on file  Food Insecurity: Not on file  Transportation Needs: Not on file  Physical Activity: Not on file  Stress: Not on file  Social Connections: Not on file   Family History  Problem Relation Age of Onset  . Stroke Mother   . Uterine cancer Mother   . Thyroid cancer Mother   . Stroke Father   . Diabetes Father   . Colon cancer Neg Hx   . Stomach cancer Neg Hx    No Known Allergies Prior to Admission medications   Medication Sig Start Date End Date Taking? Authorizing Provider  clopidogrel (PLAVIX) 75 MG tablet TAKE 1 TABLET BY MOUTH EVERY DAY 10/18/20   [name], [name], MD  furosemide (LASIX) 40 MG tablet TAKE 1 TABLET BY MOUTH EVERY DAY 09/27/20   [name], [name], MD  Multiple Vitamins-Minerals (EYE VITAMINS PO) Take by mouth.    [provider]  Omega-3 Fatty Acids (FISH OIL) 1200 MG CAPS Take 1,200 mg by mouth every evening.    [provider]  simvastatin (ZOCOR)  40 MG tablet TAKE 1 TABLET BY MOUTH EVERYDAY AT BEDTIME 03/27/20   [name], [name], MD  tamsulosin (FLOMAX) 0.4 MG CAPS capsule Take 1 capsule (0.4 mg total) by mouth daily. 08/09/20   [name], [name], MD     Positive ROS: Otherwise negative  All other systems have been reviewed and were otherwise negative with the exception of those mentioned in the HPI and as above.  Physical Exam: Constitutional: Alert, well-appearing, no acute distress Ears: External ears without lesions or tenderness.  On microscopic exam of his ear canals he had wax buildup that was really not obstructing but worse on the left side.  This was cleaned with curettes and forceps.  On examination of the TMs the right TM is retracted with the TM draped over the incus and stapes on the right  side posteriorly.  On the left side the TM is very thickened with an anterior TM perforation but no active drainage noted.  With his hearing aid out of the right ear I am able to converse with him and he is able to understand me talking with just his left hearing aid in place. Nasal: External nose without lesions. Clear nasal passages Oral: Oropharynx clear. Neck: No palpable adenopathy or masses Respiratory: Breathing comfortably  Skin: No facial/neck lesions or rash noted.  Cerumen impaction removal  Date/Time: 12/11/2020 4:01 PM Performed by: Rozetta Nunnery, MD Authorized by: Rozetta Nunnery, MD   Consent:    Consent obtained:  Verbal   Consent given by:  Patient   Risks discussed:  Pain and bleeding Procedure details:    Location:  L ear and R ear   Procedure type: curette and forceps   Post-procedure details:    Inspection:  TM intact and canal normal   Hearing quality:  Improved   Patient tolerance of procedure:  Tolerated well, no immediate complications Comments:     Both ear canals were cleaned with curette and forceps.  The right TM is retracted but no obvious middle ear effusion noted.  The left TM is thickened with an anterior left TM perforation but no active drainage noted.    Assessment: On recent audiologic testing patient has had progressive conductive hearing loss more so in the left ear compared to the right which has been stable.  He has also had additional progression of poor quality of his word recognition scores on the left side. He has also had an additional drop of a bone score at the 1000 frequency on the left side more so than the right but other bone scores have also dropped slightly.  Plan: I reviewed the hearing test with the patient in the office today. I cleaned his ears. No further evaluation or treatment is warranted at this time as I suspect this is mostly age-related progression of his hearing loss. Discussed with him concerning  keeping all liquid and water out of the left ear. He will follow-up if he has any pain discomfort or significant change in his hearing.  Radene Journey, MD

## 2020-12-12 ENCOUNTER — Encounter (INDEPENDENT_AMBULATORY_CARE_PROVIDER_SITE_OTHER): Payer: Self-pay

## 2021-01-03 DIAGNOSIS — M25552 Pain in left hip: Secondary | ICD-10-CM | POA: Diagnosis not present

## 2021-01-16 ENCOUNTER — Other Ambulatory Visit: Payer: Self-pay | Admitting: Internal Medicine

## 2021-01-24 ENCOUNTER — Other Ambulatory Visit: Payer: Self-pay | Admitting: Internal Medicine

## 2021-01-24 DIAGNOSIS — N1831 Chronic kidney disease, stage 3a: Secondary | ICD-10-CM

## 2021-01-25 DIAGNOSIS — H25811 Combined forms of age-related cataract, right eye: Secondary | ICD-10-CM | POA: Diagnosis not present

## 2021-01-25 DIAGNOSIS — H25812 Combined forms of age-related cataract, left eye: Secondary | ICD-10-CM | POA: Diagnosis not present

## 2021-01-25 DIAGNOSIS — H353131 Nonexudative age-related macular degeneration, bilateral, early dry stage: Secondary | ICD-10-CM | POA: Diagnosis not present

## 2021-02-21 DIAGNOSIS — H2512 Age-related nuclear cataract, left eye: Secondary | ICD-10-CM | POA: Diagnosis not present

## 2021-02-21 DIAGNOSIS — H25812 Combined forms of age-related cataract, left eye: Secondary | ICD-10-CM | POA: Diagnosis not present

## 2021-02-24 ENCOUNTER — Encounter: Payer: Self-pay | Admitting: Internal Medicine

## 2021-03-14 DIAGNOSIS — H2511 Age-related nuclear cataract, right eye: Secondary | ICD-10-CM | POA: Diagnosis not present

## 2021-03-14 DIAGNOSIS — H25811 Combined forms of age-related cataract, right eye: Secondary | ICD-10-CM | POA: Diagnosis not present

## 2021-04-10 ENCOUNTER — Other Ambulatory Visit: Payer: Self-pay | Admitting: Internal Medicine

## 2021-04-10 DIAGNOSIS — N1831 Chronic kidney disease, stage 3a: Secondary | ICD-10-CM

## 2021-06-15 ENCOUNTER — Other Ambulatory Visit: Payer: Self-pay | Admitting: Internal Medicine

## 2021-07-06 ENCOUNTER — Other Ambulatory Visit: Payer: Self-pay | Admitting: Internal Medicine

## 2021-07-06 DIAGNOSIS — N1831 Chronic kidney disease, stage 3a: Secondary | ICD-10-CM

## 2021-07-16 ENCOUNTER — Other Ambulatory Visit: Payer: Self-pay

## 2021-07-16 ENCOUNTER — Inpatient Hospital Stay: Payer: Medicare Other | Admitting: Oncology

## 2021-07-16 ENCOUNTER — Inpatient Hospital Stay: Payer: Medicare Other | Attending: Oncology

## 2021-07-16 VITALS — BP 162/91 | HR 88 | Temp 98.1°F | Resp 17 | Ht 69.0 in | Wt 199.3 lb

## 2021-07-16 DIAGNOSIS — C9110 Chronic lymphocytic leukemia of B-cell type not having achieved remission: Secondary | ICD-10-CM | POA: Diagnosis not present

## 2021-07-16 DIAGNOSIS — D759 Disease of blood and blood-forming organs, unspecified: Secondary | ICD-10-CM | POA: Diagnosis not present

## 2021-07-16 LAB — CBC WITH DIFFERENTIAL (CANCER CENTER ONLY)
Abs Immature Granulocytes: 0.02 10*3/uL (ref 0.00–0.07)
Basophils Absolute: 0 10*3/uL (ref 0.0–0.1)
Basophils Relative: 0 %
Eosinophils Absolute: 0.2 10*3/uL (ref 0.0–0.5)
Eosinophils Relative: 2 %
HCT: 44.7 % (ref 39.0–52.0)
Hemoglobin: 15.4 g/dL (ref 13.0–17.0)
Immature Granulocytes: 0 %
Lymphocytes Relative: 55 %
Lymphs Abs: 5.4 10*3/uL — ABNORMAL HIGH (ref 0.7–4.0)
MCH: 29.1 pg (ref 26.0–34.0)
MCHC: 34.5 g/dL (ref 30.0–36.0)
MCV: 84.5 fL (ref 80.0–100.0)
Monocytes Absolute: 0.7 10*3/uL (ref 0.1–1.0)
Monocytes Relative: 7 %
Neutro Abs: 3.5 10*3/uL (ref 1.7–7.7)
Neutrophils Relative %: 36 %
Platelet Count: 171 10*3/uL (ref 150–400)
RBC: 5.29 MIL/uL (ref 4.22–5.81)
RDW: 13.1 % (ref 11.5–15.5)
Smear Review: NORMAL
WBC Count: 9.8 10*3/uL (ref 4.0–10.5)
nRBC: 0 % (ref 0.0–0.2)

## 2021-07-16 LAB — CMP (CANCER CENTER ONLY)
ALT: 18 U/L (ref 0–44)
AST: 21 U/L (ref 15–41)
Albumin: 3.8 g/dL (ref 3.5–5.0)
Alkaline Phosphatase: 79 U/L (ref 38–126)
Anion gap: 10 (ref 5–15)
BUN: 15 mg/dL (ref 8–23)
CO2: 22 mmol/L (ref 22–32)
Calcium: 8.8 mg/dL — ABNORMAL LOW (ref 8.9–10.3)
Chloride: 109 mmol/L (ref 98–111)
Creatinine: 1.21 mg/dL (ref 0.61–1.24)
GFR, Estimated: >60 mL/min (ref >60)
Glucose, Bld: 127 mg/dL — ABNORMAL HIGH (ref 70–99)
Potassium: 3.9 mmol/L (ref 3.5–5.1)
Sodium: 141 mmol/L (ref 135–145)
Total Bilirubin: 0.8 mg/dL (ref 0.3–1.2)
Total Protein: 6.5 g/dL (ref 6.5–8.1)

## 2021-07-16 NOTE — Progress Notes (Signed)
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  He has not required treatment since that time.  CURRENT THERAPY: Active surveillance.   INTERVAL HISTORY: Samuel Stephens returns today for a follow-up visit.  Since the last visit, he reports no major changes in his health.  He denies any recent hospitalizations or illnesses.  He denies any lymphadenopathy or petechiae.  His performance status and quality of life remains unchanged.       ALLERGIES:  has No Known Allergies.  MEDICATIONS: Reviewed without changes.  Current Outpatient Medications on File Prior to Visit  Medication Sig Dispense Refill   clopidogrel (PLAVIX) 75 MG tablet TAKE 1 TABLET BY MOUTH EVERY DAY 90 tablet 1   furosemide (LASIX) 40 MG tablet TAKE 1 TABLET BY MOUTH EVERY DAY 90 tablet 0   Multiple Vitamins-Minerals (EYE VITAMINS PO) Take by mouth.     Omega-3 Fatty Acids (FISH OIL) 1200 MG CAPS Take 1,200 mg by mouth every evening.     simvastatin (ZOCOR) 40 MG tablet TAKE 1 TABLET BY MOUTH EVERYDAY AT BEDTIME 90 tablet 1   tamsulosin (FLOMAX) 0.4 MG CAPS capsule Take 1 capsule (0.4 mg total) by mouth daily. 90 capsule 1   No current facility-administered medications on file prior to visit.     PHYSICAL EXAMINATION:  Blood pressure (!) 162/91, pulse 88, temperature 98.1 F (36.7 C), temperature source Tympanic, resp. rate 17, height 5\' 9"  (1.753 m), weight 199 lb 4.8 oz (90.4 kg), SpO2 96 %.    ECOG PERFORMANCE STATUS: 0 - Asymptomatic   General appearance: Alert, awake without any distress. Head: Atraumatic without abnormalities Oropharynx: Without any thrush or ulcers. Eyes: No scleral icterus. Lymph nodes: No lymphadenopathy noted in the cervical, supraclavicular, or axillary nodes Heart:regular rate and rhythm,  without any murmurs or gallops.   Lung: Clear to auscultation without any rhonchi, wheezes or dullness to percussion. Abdomin: Soft, nontender without any shifting dullness or ascites. Musculoskeletal: No clubbing or cyanosis. Neurological: No motor or sensory deficits. Skin: No rashes or lesions.        LABORATORY DATA: CBC    Component Value Date/Time   WBC 13.6 (H) 10/24/2020 0855   RBC 5.29 10/24/2020 0855   HGB 15.3 10/24/2020 0855   HGB 15.6 07/17/2020 1006   HGB 14.6 07/17/2017 1259   HCT 45.6 10/24/2020 0855   HCT 43.4 07/17/2017 1259   PLT 186.0 10/24/2020 0855   PLT 188 07/17/2020 1006   PLT 154 07/17/2017 1259   MCV 86.3 10/24/2020 0855   MCV 85.8 07/17/2017 1259   MCH 29.4 07/17/2020 1006   MCHC 33.5 10/24/2020 0855   RDW 13.6 10/24/2020 0855   RDW 14.2 07/17/2017 1259   LYMPHSABS 8.3 (H) 10/24/2020 0855   LYMPHSABS 9.6 (H) 07/17/2017 1259   MONOABS 0.7 10/24/2020 0855   MONOABS 0.5 07/17/2017 1259   EOSABS 0.2 10/24/2020 0855   EOSABS 0.1 07/17/2017 1259   BASOSABS 0.0 10/24/2020 0855   BASOSABS 0.0 07/17/2017 1259      ASSESSMENT AND PLAN:  80 year old man with:  1.  CLL presented with lymphocytosis and adenopathy diagnosed in 2002.  The natural course of his disease was reviewed at this time and treatment choices were discussed.  He continues to be on active surveillance without any indication for treatment.  Indication treatment CLL will  be symptomatic lymphadenopathy, constitutional symptoms, cytopenias among others.  Treatment choices including oral targeted therapy, systemic chemotherapy among others were reviewed and will be deferred at this time.  Laboratory data in the last 3 years noted to with a mild lymphocytosis which has not changed dramatically.  Laboratory data from today showed normal hematological parameters including white cell count, hemoglobin and platelet count.  2.  Autoimmune cytopenias: He has not developed any anemia or  thrombocytopenia we will continue to monitor.  3.  Infectious disease considerations: He has not experienced any opportunistic infections with increased risk related to CLL.  4.  Covid vaccination considerations: He remains up-to-date at this time.   5. Follow-up: He will return in 12 weeks for a follow-up.  30  minutes were spent on this encounter.  The time was spent on detailing the natural course of his disease, reviewing laboratory data and future plan of care treatment choices.   Zola Button MD 07/16/21

## 2021-07-17 ENCOUNTER — Other Ambulatory Visit: Payer: Self-pay | Admitting: Internal Medicine

## 2021-09-11 DIAGNOSIS — H353132 Nonexudative age-related macular degeneration, bilateral, intermediate dry stage: Secondary | ICD-10-CM | POA: Diagnosis not present

## 2021-09-11 DIAGNOSIS — H52223 Regular astigmatism, bilateral: Secondary | ICD-10-CM | POA: Diagnosis not present

## 2021-09-11 DIAGNOSIS — H5203 Hypermetropia, bilateral: Secondary | ICD-10-CM | POA: Diagnosis not present

## 2021-09-13 DIAGNOSIS — H6123 Impacted cerumen, bilateral: Secondary | ICD-10-CM | POA: Diagnosis not present

## 2021-09-13 DIAGNOSIS — H7202 Central perforation of tympanic membrane, left ear: Secondary | ICD-10-CM | POA: Diagnosis not present

## 2021-09-13 DIAGNOSIS — H7411 Adhesive right middle ear disease: Secondary | ICD-10-CM | POA: Diagnosis not present

## 2021-09-13 DIAGNOSIS — Z974 Presence of external hearing-aid: Secondary | ICD-10-CM | POA: Diagnosis not present

## 2021-09-20 ENCOUNTER — Encounter: Payer: Self-pay | Admitting: Internal Medicine

## 2021-10-07 DIAGNOSIS — H43823 Vitreomacular adhesion, bilateral: Secondary | ICD-10-CM | POA: Diagnosis not present

## 2021-10-07 DIAGNOSIS — H353132 Nonexudative age-related macular degeneration, bilateral, intermediate dry stage: Secondary | ICD-10-CM | POA: Diagnosis not present

## 2021-10-17 ENCOUNTER — Other Ambulatory Visit: Payer: Self-pay | Admitting: Internal Medicine

## 2021-10-17 DIAGNOSIS — N1831 Chronic kidney disease, stage 3a: Secondary | ICD-10-CM

## 2021-10-30 DIAGNOSIS — L814 Other melanin hyperpigmentation: Secondary | ICD-10-CM | POA: Diagnosis not present

## 2021-10-30 DIAGNOSIS — C4442 Squamous cell carcinoma of skin of scalp and neck: Secondary | ICD-10-CM | POA: Diagnosis not present

## 2021-10-30 DIAGNOSIS — L821 Other seborrheic keratosis: Secondary | ICD-10-CM | POA: Diagnosis not present

## 2021-11-04 DIAGNOSIS — H903 Sensorineural hearing loss, bilateral: Secondary | ICD-10-CM | POA: Diagnosis not present

## 2021-11-08 DIAGNOSIS — M542 Cervicalgia: Secondary | ICD-10-CM | POA: Diagnosis not present

## 2021-11-28 ENCOUNTER — Encounter: Payer: Self-pay | Admitting: Internal Medicine

## 2021-11-28 ENCOUNTER — Ambulatory Visit (INDEPENDENT_AMBULATORY_CARE_PROVIDER_SITE_OTHER): Payer: Medicare Other | Admitting: Internal Medicine

## 2021-11-28 VITALS — BP 130/80 | HR 85 | Temp 97.6°F | Ht 69.0 in | Wt 192.6 lb

## 2021-11-28 DIAGNOSIS — Z0000 Encounter for general adult medical examination without abnormal findings: Secondary | ICD-10-CM

## 2021-11-28 DIAGNOSIS — E559 Vitamin D deficiency, unspecified: Secondary | ICD-10-CM | POA: Diagnosis not present

## 2021-11-28 DIAGNOSIS — N1831 Chronic kidney disease, stage 3a: Secondary | ICD-10-CM

## 2021-11-28 DIAGNOSIS — Z1211 Encounter for screening for malignant neoplasm of colon: Secondary | ICD-10-CM

## 2021-11-28 DIAGNOSIS — C9110 Chronic lymphocytic leukemia of B-cell type not having achieved remission: Secondary | ICD-10-CM

## 2021-11-28 DIAGNOSIS — R7302 Impaired glucose tolerance (oral): Secondary | ICD-10-CM | POA: Diagnosis not present

## 2021-11-28 DIAGNOSIS — E785 Hyperlipidemia, unspecified: Secondary | ICD-10-CM

## 2021-11-28 DIAGNOSIS — N400 Enlarged prostate without lower urinary tract symptoms: Secondary | ICD-10-CM

## 2021-11-28 LAB — CBC WITH DIFFERENTIAL/PLATELET
Basophils Absolute: 0 10*3/uL (ref 0.0–0.1)
Basophils Relative: 0.4 % (ref 0.0–3.0)
Eosinophils Absolute: 0.2 10*3/uL (ref 0.0–0.7)
Eosinophils Relative: 2 % (ref 0.0–5.0)
HCT: 45.5 % (ref 39.0–52.0)
Hemoglobin: 15.2 g/dL (ref 13.0–17.0)
Lymphocytes Relative: 53 % — ABNORMAL HIGH (ref 12.0–46.0)
Lymphs Abs: 4.4 10*3/uL — ABNORMAL HIGH (ref 0.7–4.0)
MCHC: 33.3 g/dL (ref 30.0–36.0)
MCV: 87.7 fl (ref 78.0–100.0)
Monocytes Absolute: 0.6 10*3/uL (ref 0.1–1.0)
Monocytes Relative: 6.7 % (ref 3.0–12.0)
Neutro Abs: 3.1 10*3/uL (ref 1.4–7.7)
Neutrophils Relative %: 37.9 % — ABNORMAL LOW (ref 43.0–77.0)
Platelets: 170 10*3/uL (ref 150.0–400.0)
RBC: 5.19 Mil/uL (ref 4.22–5.81)
RDW: 13.7 % (ref 11.5–15.5)
WBC: 8.2 10*3/uL (ref 4.0–10.5)

## 2021-11-28 LAB — LIPID PANEL
Cholesterol: 156 mg/dL (ref 0–200)
HDL: 48 mg/dL (ref >39.00)
LDL Cholesterol: 94 mg/dL (ref 0–99)
NonHDL: 108.37
Total CHOL/HDL Ratio: 3
Triglycerides: 72 mg/dL (ref 0.0–149.0)
VLDL: 14.4 mg/dL (ref 0.0–40.0)

## 2021-11-28 LAB — COMPREHENSIVE METABOLIC PANEL
ALT: 18 U/L (ref 0–53)
AST: 22 U/L (ref 0–37)
Albumin: 4.4 g/dL (ref 3.5–5.2)
Alkaline Phosphatase: 78 U/L (ref 39–117)
BUN: 18 mg/dL (ref 6–23)
CO2: 26 mEq/L (ref 19–32)
Calcium: 9.3 mg/dL (ref 8.4–10.5)
Chloride: 105 mEq/L (ref 96–112)
Creatinine, Ser: 1.29 mg/dL (ref 0.40–1.50)
GFR: 52.55 mL/min — ABNORMAL LOW (ref >60.00)
Glucose, Bld: 132 mg/dL — ABNORMAL HIGH (ref 70–99)
Potassium: 4.4 mEq/L (ref 3.5–5.1)
Sodium: 140 mEq/L (ref 135–145)
Total Bilirubin: 1 mg/dL (ref 0.2–1.2)
Total Protein: 6.7 g/dL (ref 6.0–8.3)

## 2021-11-28 LAB — HEMOGLOBIN A1C: Hgb A1c MFr Bld: 5.9 % (ref 4.6–6.5)

## 2021-11-28 LAB — VITAMIN D 25 HYDROXY (VIT D DEFICIENCY, FRACTURES): VITD: 51 ng/mL (ref 30.00–100.00)

## 2021-11-28 NOTE — Patient Instructions (Signed)
-  Nice seeing you today!! ? ?-Lab work today; will notify you once results are available. ? ?-Schedule follow up in 1 year or sooner as needed. ? ?HAPPY BIRTHDAY!!!!! :) ?

## 2021-11-28 NOTE — Progress Notes (Signed)
? ? ? ?Established Patient Office Visit ? ? ? ? ?This visit occurred during the SARS-CoV-2 public health emergency.  Safety protocols were in place, including screening questions prior to the visit, additional usage of staff PPE, and extensive cleaning of exam room while observing appropriate contact time as indicated for disinfecting solutions.  ? ? ?CC/Reason for Visit: Annual preventive exam and subsequent Medicare wellness visit ? ?HPI: Samuel Stephens is a 81 y.o. male who is coming in today for the above mentioned reasons. Past Medical History is significant for:  CKD Stage II-III, HLD, CLL (stable and followed by oncology), h/o CVA/TIA, BPH.  Since I last saw him he had his annual follow-up with oncology in regards to his CLL and he remained stable without treatment.  He also had a visit with a retina specialist and was diagnosed with a dry macular edema plan is just for follow-up.  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  He is otherwise doing well and has no acute concerns or complaints.  Today is his 81th birthday.  All immunizations are up-to-date.  He last had a colonoscopy in 2015 and was advised that no further were needed.  He has routine eye and dental care.  He has been following with Dr. Lucia Gaskins in regards to his decreased hearing. ? ? ?Past Medical/Surgical History: ?Past Medical History:  ?Diagnosis Date  ? CKD (chronic kidney disease)   ? CLL (chronic lymphoblastic leukemia) 2002  ? COLONIC POLYPS, HX OF- Adenomas 03/26/2007  ?     ? Glucose intolerance (impaired glucose tolerance)   ? Hyperlipidemia   ? Pedal edema   ? ? ?Past Surgical History:  ?Procedure Laterality Date  ? COLONOSCOPY W/ BIOPSIES    ? multiple  ? TEE WITHOUT CARDIOVERSION  09/16/2012  ? Procedure: TRANSESOPHAGEAL ECHOCARDIOGRAM (TEE);  Surgeon: Larey Dresser, MD;  Location: [facility];  Service: Cardiovascular;  Laterality: N/A;   ? ? ?Social History: ? reports that he has never smoked. He has never used smokeless tobacco. He reports current alcohol use. He reports that he does not use drugs. ? ?Allergies: ?No Known Allergies ? ?Family History:  ?Family History  ?Problem Relation Age of Onset  ? Stroke Mother   ? Uterine cancer Mother   ? Thyroid cancer Mother   ? Stroke Father   ? Diabetes Father   ? Colon cancer Neg Hx   ? Stomach cancer Neg Hx   ? ? ? ?Current Outpatient Medications:  ?  clopidogrel (PLAVIX) 75 MG tablet, TAKE 1 TABLET BY MOUTH EVERY DAY, Disp: 90 tablet, Rfl: 1 ?  furosemide (LASIX) 40 MG tablet, TAKE 1 TABLET BY MOUTH EVERY DAY, Disp: 90 tablet, Rfl: 0 ?  Multiple Vitamins-Minerals (EYE VITAMINS PO), Take by mouth., Disp: , Rfl:  ?  Omega-3 Fatty Acids (FISH OIL) 1200 MG CAPS, Take 1,200 mg by mouth every evening., Disp: , Rfl:  ?  simvastatin (ZOCOR) 40 MG tablet, TAKE 1 TABLET BY MOUTH EVERYDAY AT BEDTIME, Disp: 90 tablet, Rfl: 1 ? ?Review of Systems:  ?Constitutional: Denies fever, chills, diaphoresis, appetite change and fatigue.  ?HEENT: Denies photophobia, eye pain, redness,  ear pain, congestion, sore throat, rhinorrhea, sneezing, mouth sores, trouble swallowing, neck pain, neck stiffness and tinnitus.   ?Respiratory: Denies SOB, DOE, cough, chest tightness,  and wheezing.   ?Cardiovascular: Denies chest pain, palpitations and leg swelling.  ?Gastrointestinal: Denies  nausea, vomiting, abdominal pain, diarrhea, constipation, blood in stool and abdominal distention.  ?Genitourinary: Denies dysuria, urgency, frequency, hematuria, flank pain and difficulty urinating.  ?Endocrine: Denies: hot or cold intolerance, sweats, changes in hair or nails, polyuria, polydipsia. ?Musculoskeletal: Denies myalgias, back pain, joint swelling, arthralgias and gait problem.  ?Skin: Denies pallor, rash and wound.  ?Neurological: Denies dizziness, seizures, syncope, weakness, light-headedness, numbness and headaches.  ?Hematological:  Denies adenopathy. Easy bruising, personal or family bleeding history  ?Psychiatric/Behavioral: Denies suicidal ideation, mood changes, confusion, nervousness, sleep disturbance and agitation ? ? ? ?Physical Exam: ?Vitals:  ? 11/28/21 0825  ?BP: 130/80  ?Pulse: 85  ?Temp: 97.6 ?F (36.4 ?C)  ?TempSrc: Oral  ?SpO2: 97%  ?Weight: 192 lb 9.6 oz (87.4 kg)  ?Height: '5\' 9"'$  (1.753 m)  ? ? ?Body mass index is 28.44 kg/m?. ? ? ?Constitutional: NAD, calm, comfortable ?Eyes: PERRL, lids and conjunctivae normal ?ENMT: Mucous membranes are moist. Posterior pharynx clear of any exudate or lesions. Normal dentition. Tympanic membrane is pearly white, no erythema or bulging. ?Neck: normal, supple, no masses, no thyromegaly ?Respiratory: clear to auscultation bilaterally, no wheezing, no crackles. Normal respiratory effort. No accessory muscle use.  ?Cardiovascular: Regular rate and rhythm, no murmurs / rubs / gallops. No extremity edema. 2+ pedal pulses. No carotid bruits.  ?Abdomen: no tenderness, no masses palpated. No hepatosplenomegaly. Bowel sounds positive.  ?Musculoskeletal: no clubbing / cyanosis. No joint deformity upper and lower extremities. Good ROM, no contractures. Normal muscle tone.  ?Skin: no rashes, lesions, ulcers. No induration ?Neurologic: CN 2-12 grossly intact. Sensation intact, DTR normal. Strength 5/5 in all 4.  ?Psychiatric: Normal judgment and insight. Alert and oriented x 3. Normal mood.  ? ? ?Subsequent Medicare wellness visit ?  ?1. Risk factors, based on past  M,S,F -cardiovascular disease risk factors include age, gender, history of hyperlipidemia. ?  ?2.  Physical activities: He remains active with activities of daily living but no actual exercise. ?  ?3.  Depression/mood: Stable, not depressed ?  ?4.  Hearing: Decreased hearing bilaterally ?  ?5.  ADL's: Independent in all ADLs ?  ?6.  Fall risk: Low fall risk ?  ?7.  Home safety: No problems identified ?  ?8.  Height weight, and visual acuity:  height and weight as above, vision: ? ?Vision Screening  ? Right eye Left eye Both eyes  ?Without correction     ?With correction '20/25 20/25 20/25 '$  ? ?  ?9.  Counseling: We have discussed healthy lifestyle changes including light to moderate exercising at least 3 days a week ?  ?10. Lab orders based on risk factors: Laboratory update will be reviewed ?  ?11. Referral : None today ?  ?12. Care plan: Follow-up with me in 6 to 12 months ?  ?13. Cognitive assessment: No cognitive impairment ?  ?14. Screening: Patient provided with a written and personalized 5-10 year screening schedule in the AVS. yes ?  ?15. Provider List Update: PCP, oncologist Dr. Alen Blew, dermatologist, ophthalmologist. ? ?16. Advance Directives: Full code ? ? ?17. Opioids: Patient is not on any opioid prescriptions and has no risk factors for a substance use disorder. ? ? ?[place] [facility] Office Visit from 10/24/2020 in [facility] at [place]  ?PHQ-9 Total Score 1  ? ?  ? ? ? ?  07/20/2018  ?  4:10 PM 09/16/2019  ?  8:37 AM 08/22/2020  ?  2:00 PM 10/24/2020  ?  8:23 AM 11/28/2021  ?  8:24 AM  ?  Fall Risk  ?Falls in the past year? 0 0 0 0 0  ?Was there an injury with Fall? 0 0  0 0  ?Fall Risk Category Calculator 0 0  0 0  ?Fall Risk Category Low Low  Low Low  ?Patient Fall Risk Level     Low fall risk  ?Patient at Risk for Falls Due to     No Fall Risks  ?Fall risk Follow up     Falls evaluation completed  ? ? ? ?Impression and Plan: ? ?Encounter for preventive health examination ?-Recommend routine eye and dental care. ?-Immunizations: All immunizations are up-to-date and age-appropriate ?-Healthy lifestyle discussed in detail. ?-Labs to be updated today. ?-Colon cancer screening: 12/2013, no further due to age ?-Breast cancer screening: Not applicable ?-Cervical cancer screening: Not applicable ?-Lung cancer screening: Never smoker, not applicable ?-Prostate cancer screening: No further due to age ?-DEXA: Not applicable ? ?Vitamin D  deficiency ? - Plan: VITAMIN D 25 Hydroxy (Vit-D Deficiency, Fractures), VITAMIN D 25 Hydroxy (Vit-D Deficiency, Fractures) ? ?Impaired glucose tolerance ? - Plan: Hemoglobin A1c, Hemoglobin A1c ? ?Stage 3a chronic kidney disea

## 2021-12-03 ENCOUNTER — Encounter: Payer: Self-pay | Admitting: Internal Medicine

## 2022-01-09 ENCOUNTER — Other Ambulatory Visit: Payer: Self-pay | Admitting: Internal Medicine

## 2022-01-13 ENCOUNTER — Other Ambulatory Visit: Payer: Self-pay | Admitting: Internal Medicine

## 2022-01-13 DIAGNOSIS — N1831 Chronic kidney disease, stage 3a: Secondary | ICD-10-CM

## 2022-01-30 DIAGNOSIS — L814 Other melanin hyperpigmentation: Secondary | ICD-10-CM | POA: Diagnosis not present

## 2022-01-30 DIAGNOSIS — S80862A Insect bite (nonvenomous), left lower leg, initial encounter: Secondary | ICD-10-CM | POA: Diagnosis not present

## 2022-01-30 DIAGNOSIS — L570 Actinic keratosis: Secondary | ICD-10-CM | POA: Diagnosis not present

## 2022-01-30 DIAGNOSIS — D225 Melanocytic nevi of trunk: Secondary | ICD-10-CM | POA: Diagnosis not present

## 2022-01-30 DIAGNOSIS — L821 Other seborrheic keratosis: Secondary | ICD-10-CM | POA: Diagnosis not present

## 2022-01-30 DIAGNOSIS — H00015 Hordeolum externum left lower eyelid: Secondary | ICD-10-CM | POA: Diagnosis not present

## 2022-01-31 DIAGNOSIS — H00025 Hordeolum internum left lower eyelid: Secondary | ICD-10-CM | POA: Diagnosis not present

## 2022-02-03 DIAGNOSIS — H353132 Nonexudative age-related macular degeneration, bilateral, intermediate dry stage: Secondary | ICD-10-CM | POA: Diagnosis not present

## 2022-02-03 DIAGNOSIS — H43823 Vitreomacular adhesion, bilateral: Secondary | ICD-10-CM | POA: Diagnosis not present

## 2022-02-06 IMAGING — DX DG CHEST 2V
2 series · 2 of 2 positions shown · non-contrast
Comparison: 06/01/2017

CLINICAL DATA: 1 year history of cough.

EXAM:
CHEST - 2 VIEW

[chest pa]
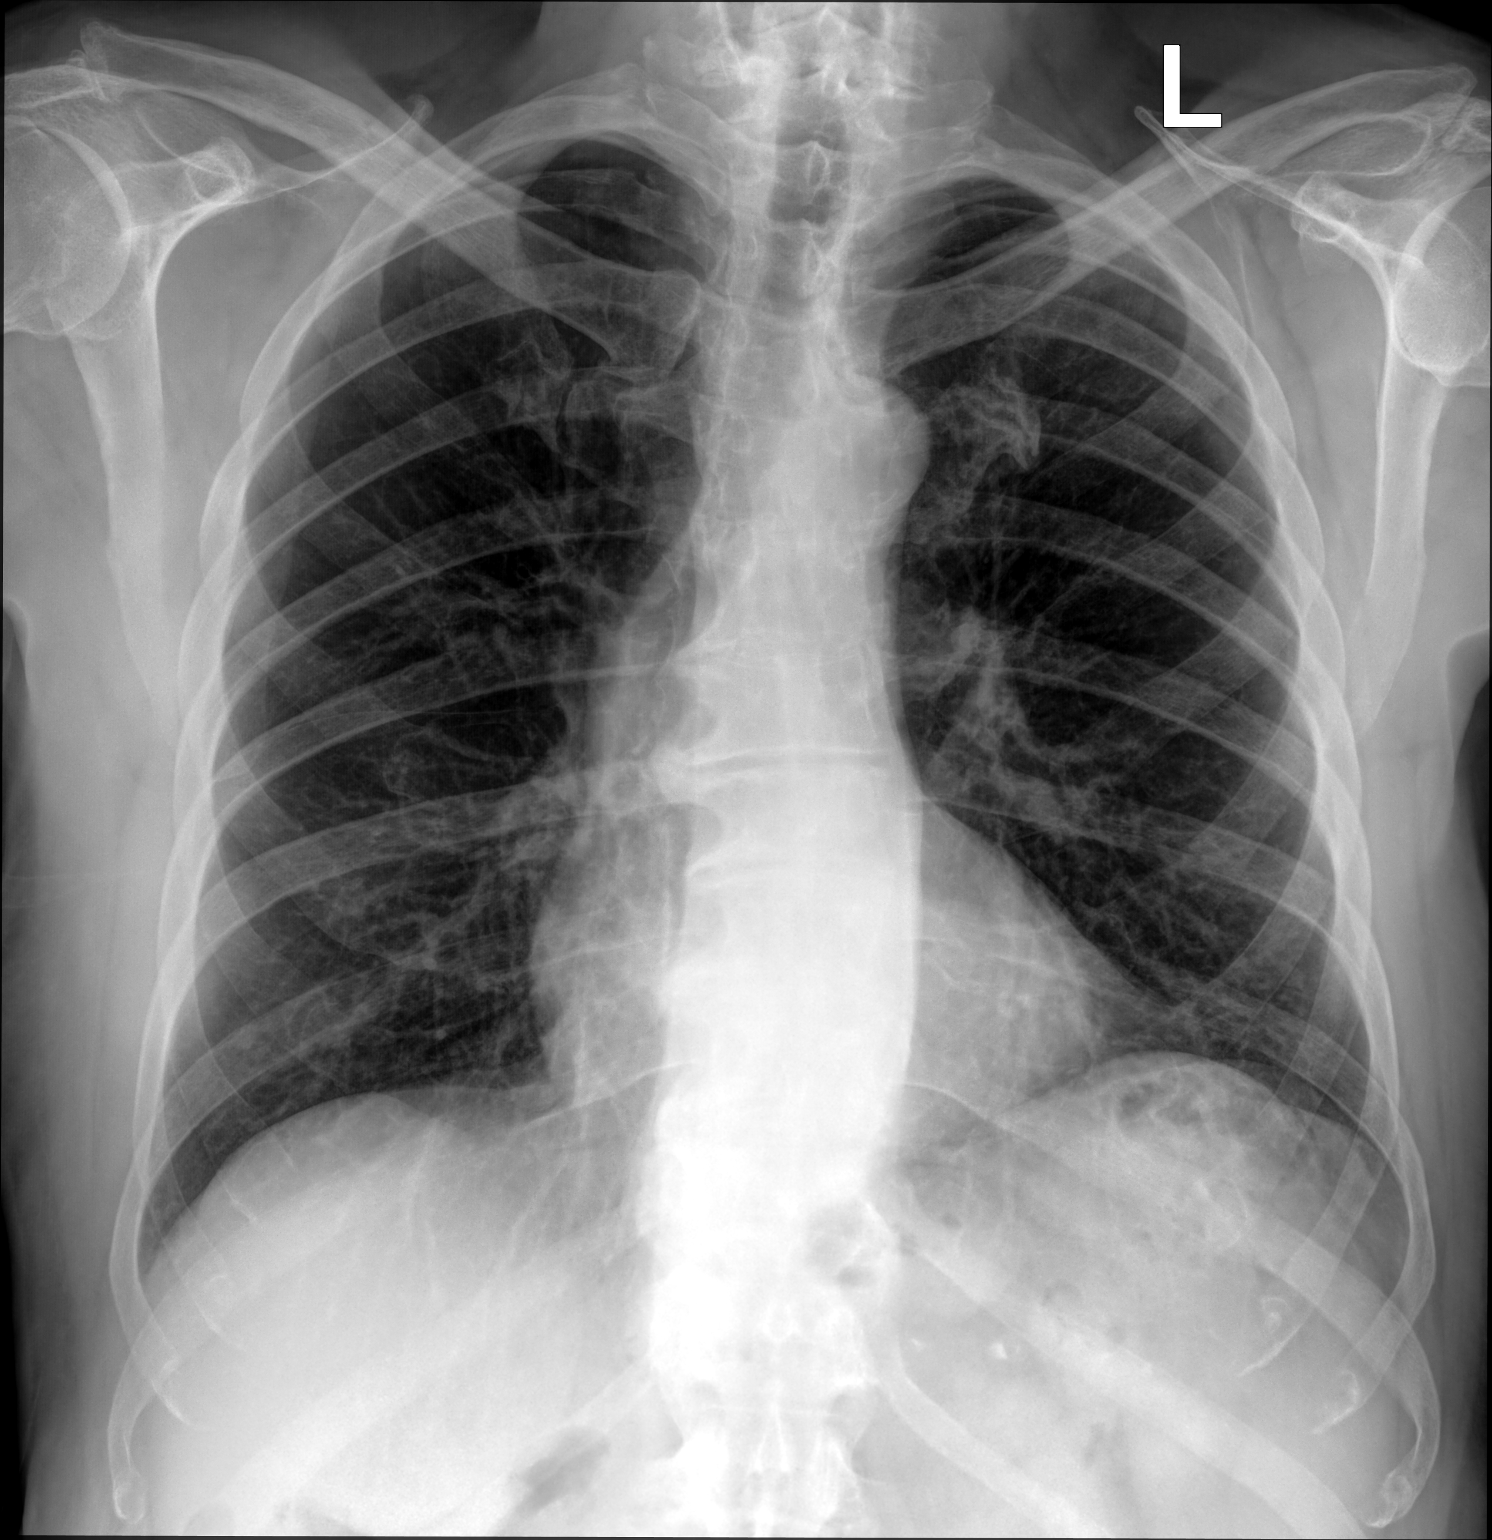

[chest lat]
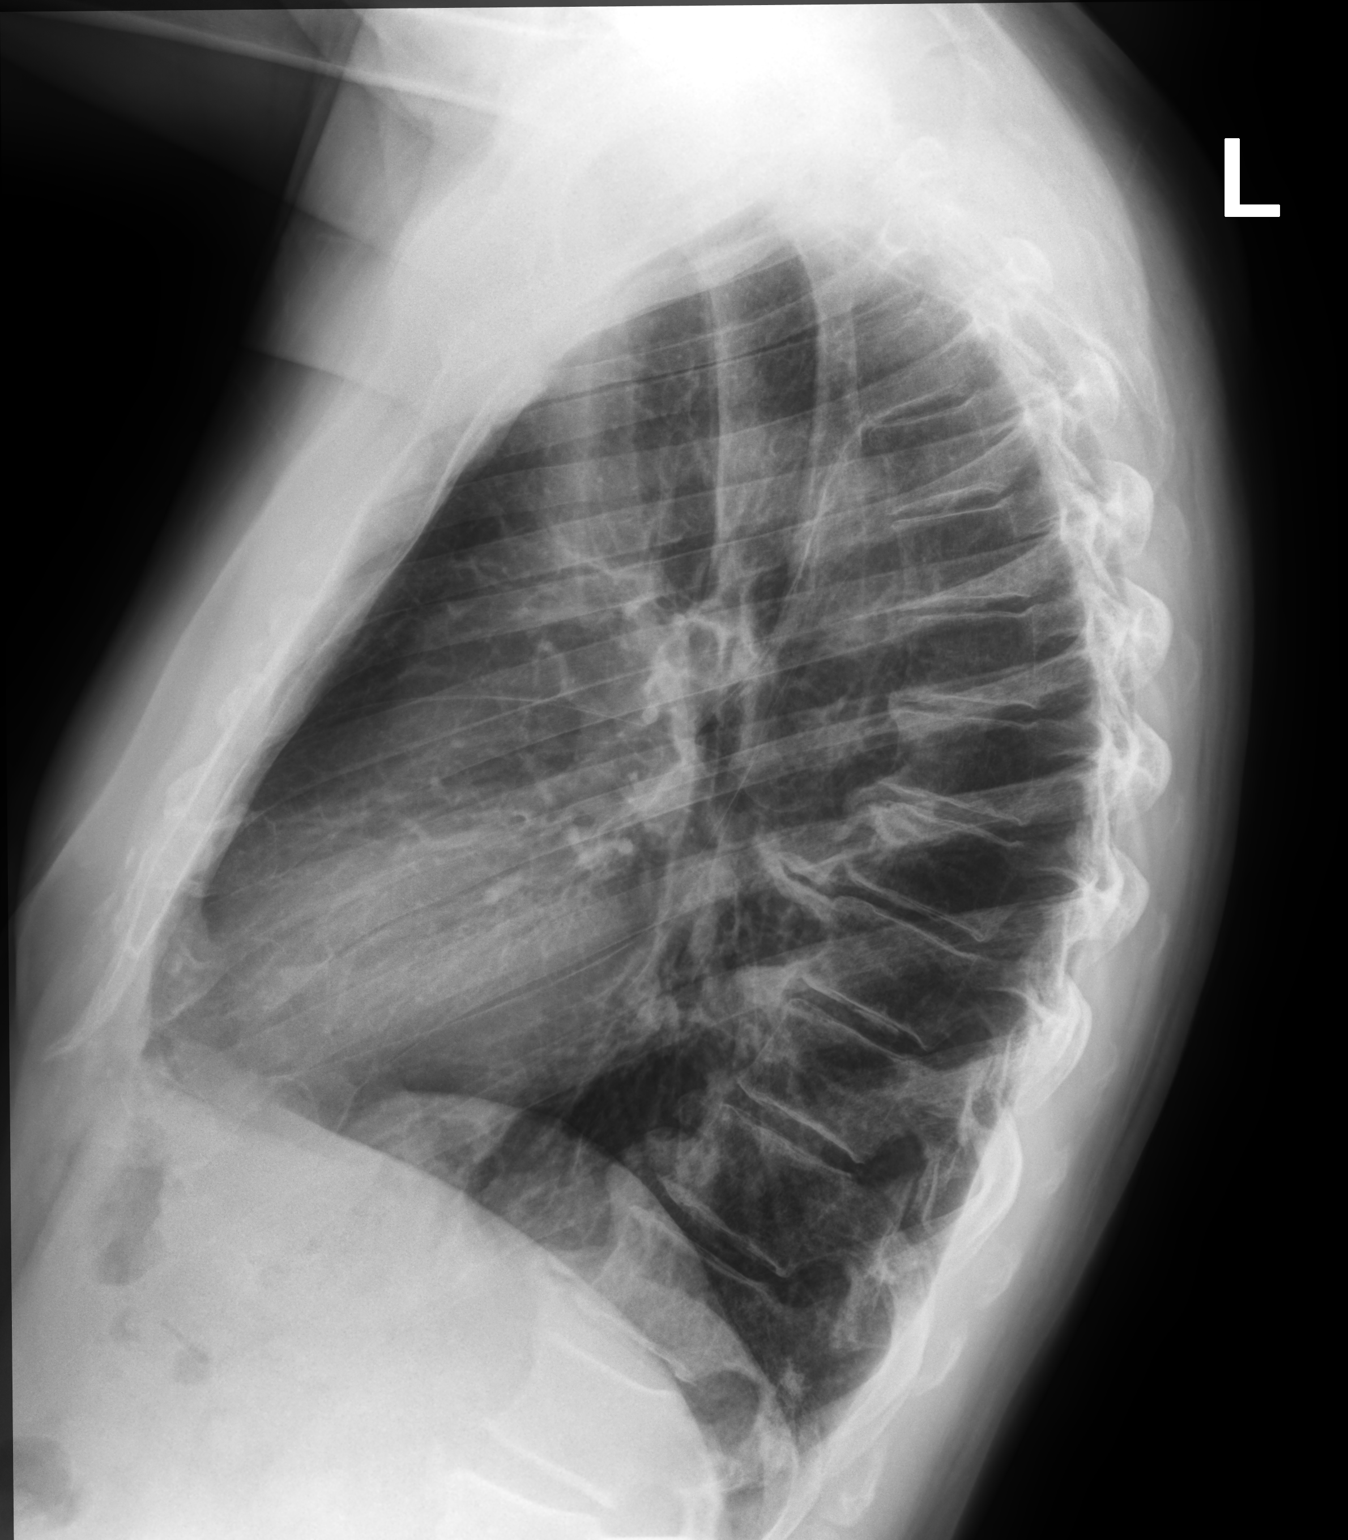

[2 of 2 positions shown; findings below may reference images not displayed]

FINDINGS: The lungs are clear without focal pneumonia, edema, pneumothorax or
pleural effusion. The cardiopericardial silhouette is within normal
limits for size. The visualized bony structures of the thorax are
intact.
IMPRESSION: No active cardiopulmonary disease.

## 2022-04-12 ENCOUNTER — Other Ambulatory Visit: Payer: Self-pay | Admitting: Internal Medicine

## 2022-04-12 DIAGNOSIS — N1831 Chronic kidney disease, stage 3a: Secondary | ICD-10-CM

## 2022-05-15 ENCOUNTER — Encounter: Payer: Self-pay | Admitting: Internal Medicine

## 2022-05-15 ENCOUNTER — Other Ambulatory Visit: Payer: Self-pay | Admitting: Internal Medicine

## 2022-05-15 DIAGNOSIS — L237 Allergic contact dermatitis due to plants, except food: Secondary | ICD-10-CM

## 2022-05-15 MED ORDER — BETAMETHASONE DIPROPIONATE 0.05 % EX CREA: TOPICAL_CREAM | Freq: Two times a day (BID) | CUTANEOUS | 0 refills | Status: AC

## 2022-06-12 ENCOUNTER — Other Ambulatory Visit: Payer: Self-pay | Admitting: Internal Medicine

## 2022-06-12 DIAGNOSIS — L237 Allergic contact dermatitis due to plants, except food: Secondary | ICD-10-CM

## 2022-07-16 ENCOUNTER — Inpatient Hospital Stay: Payer: Medicare Other | Attending: Oncology

## 2022-07-16 ENCOUNTER — Inpatient Hospital Stay (HOSPITAL_BASED_OUTPATIENT_CLINIC_OR_DEPARTMENT_OTHER): Payer: Medicare Other | Admitting: Oncology

## 2022-07-16 VITALS — BP 143/89 | HR 91 | Temp 97.7°F | Resp 18 | Ht 69.0 in | Wt 190.9 lb

## 2022-07-16 DIAGNOSIS — C9110 Chronic lymphocytic leukemia of B-cell type not having achieved remission: Secondary | ICD-10-CM | POA: Diagnosis not present

## 2022-07-16 DIAGNOSIS — Z79899 Other long term (current) drug therapy: Secondary | ICD-10-CM | POA: Insufficient documentation

## 2022-07-16 DIAGNOSIS — D759 Disease of blood and blood-forming organs, unspecified: Secondary | ICD-10-CM | POA: Insufficient documentation

## 2022-07-16 DIAGNOSIS — D72820 Lymphocytosis (symptomatic): Secondary | ICD-10-CM | POA: Insufficient documentation

## 2022-07-16 LAB — CBC WITH DIFFERENTIAL (CANCER CENTER ONLY)
Abs Immature Granulocytes: 0.03 10*3/uL (ref 0.00–0.07)
Basophils Absolute: 0 10*3/uL (ref 0.0–0.1)
Basophils Relative: 0 %
Eosinophils Absolute: 0.2 10*3/uL (ref 0.0–0.5)
Eosinophils Relative: 2 %
HCT: 47.3 % (ref 39.0–52.0)
Hemoglobin: 16.3 g/dL (ref 13.0–17.0)
Immature Granulocytes: 0 %
Lymphocytes Relative: 54 %
Lymphs Abs: 5.4 10*3/uL — ABNORMAL HIGH (ref 0.7–4.0)
MCH: 29.2 pg (ref 26.0–34.0)
MCHC: 34.5 g/dL (ref 30.0–36.0)
MCV: 84.6 fL (ref 80.0–100.0)
Monocytes Absolute: 0.6 10*3/uL (ref 0.1–1.0)
Monocytes Relative: 6 %
Neutro Abs: 3.8 10*3/uL (ref 1.7–7.7)
Neutrophils Relative %: 38 %
Platelet Count: 179 10*3/uL (ref 150–400)
RBC: 5.59 MIL/uL (ref 4.22–5.81)
RDW: 13.1 % (ref 11.5–15.5)
Smear Review: NORMAL
WBC Count: 10.1 10*3/uL (ref 4.0–10.5)
nRBC: 0 % (ref 0.0–0.2)

## 2022-07-16 LAB — CMP (CANCER CENTER ONLY)
ALT: 17 U/L (ref 0–44)
AST: 22 U/L (ref 15–41)
Albumin: 4.5 g/dL (ref 3.5–5.0)
Alkaline Phosphatase: 75 U/L (ref 38–126)
Anion gap: 7 (ref 5–15)
BUN: 20 mg/dL (ref 8–23)
CO2: 25 mmol/L (ref 22–32)
Calcium: 9.6 mg/dL (ref 8.9–10.3)
Chloride: 106 mmol/L (ref 98–111)
Creatinine: 1.3 mg/dL — ABNORMAL HIGH (ref 0.61–1.24)
GFR, Estimated: 56 mL/min — ABNORMAL LOW (ref >60)
Glucose, Bld: 119 mg/dL — ABNORMAL HIGH (ref 70–99)
Potassium: 4.3 mmol/L (ref 3.5–5.1)
Sodium: 138 mmol/L (ref 135–145)
Total Bilirubin: 1.2 mg/dL (ref 0.3–1.2)
Total Protein: 7.1 g/dL (ref 6.5–8.1)

## 2022-07-16 NOTE — Progress Notes (Signed)
[facility] OFFICE PROGRESS NOTE 07/16/22  Samuel Stephens, Samuel Halsted, MD [place] [address]    DIAGNOSIS: 81 year old man with lymphocytosis noted in 2002.  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  He has no evidence of symptomatic condition.  CURRENT THERAPY: Active surveillance.   INTERVAL HISTORY: Samuel Stephens presents today for repeat follow-up.  Since the last visit, he reports feeling well without any major complaints.  He remains reasonably active without any decline in ability to perform activities of daily living.  He denies any nausea vomiting or abdominal pain.  He does report mild lower extremity edema which is unchanged.      ALLERGIES:  has No Known Allergies.  MEDICATIONS: Reviewed without changes.  Current Outpatient Medications on File Prior to Visit  Medication Sig Dispense Refill   betamethasone dipropionate 0.05 % cream Apply topically 2 (two) times daily. 30 g 0   clopidogrel (PLAVIX) 75 MG tablet TAKE 1 TABLET BY MOUTH EVERY DAY 90 tablet 1   furosemide (LASIX) 40 MG tablet TAKE 1 TABLET BY MOUTH EVERY DAY 90 tablet 1   Multiple Vitamins-Minerals (EYE VITAMINS PO) Take by mouth.     Omega-3 Fatty Acids (FISH OIL) 1200 MG CAPS Take 1,200 mg by mouth every evening.     simvastatin (ZOCOR) 40 MG tablet TAKE 1 TABLET BY MOUTH EVERYDAY AT BEDTIME 90 tablet 1   No current facility-administered medications on file prior to visit.     PHYSICAL EXAMINATION:  Blood pressure (!) 143/89, pulse 91, temperature 97.7 F (36.5 C), temperature source Temporal, resp. rate 18, height '5\' 9"'$  (1.753 m), weight 190 lb 14.4 oz (86.6 kg), SpO2 99 %.    ECOG PERFORMANCE STATUS: 0 - Asymptomatic     General appearance: Comfortable appearing without any discomfort Head: Normocephalic without any trauma Oropharynx: Mucous membranes are moist and pink without any thrush or ulcers. Eyes: Pupils are equal  and round reactive to light. Lymph nodes: No cervical, supraclavicular, inguinal or axillary lymphadenopathy.   Heart:regular rate and rhythm.  S1 and S2.  Edema noted to his left lower remedy which is unchanged. Lung: Clear without any rhonchi or wheezes.  No dullness to percussion. Abdomin: Soft, nontender, nondistended with good bowel sounds.  No hepatosplenomegaly. Musculoskeletal: No joint deformity or effusion.  Full range of motion noted. Neurological: No deficits noted on motor, sensory and deep tendon reflex exam. Skin: No petechial rash or dryness.  Appeared moist.          LABORATORY DATA: CBC    Component Value Date/Time   WBC 10.1 07/16/2022 1011   WBC 8.2 11/28/2021 0858   RBC 5.59 07/16/2022 1011   HGB 16.3 07/16/2022 1011   HGB 14.6 07/17/2017 1259   HCT 47.3 07/16/2022 1011   HCT 43.4 07/17/2017 1259   PLT 179 07/16/2022 1011   PLT 154 07/17/2017 1259   MCV 84.6 07/16/2022 1011   MCV 85.8 07/17/2017 1259   MCH 29.2 07/16/2022 1011   MCHC 34.5 07/16/2022 1011   RDW 13.1 07/16/2022 1011   RDW 14.2 07/17/2017 1259   LYMPHSABS 5.4 (H) 07/16/2022 1011   LYMPHSABS 9.6 (H) 07/17/2017 1259   MONOABS 0.6 07/16/2022 1011   MONOABS 0.5 07/17/2017 1259   EOSABS 0.2 07/16/2022 1011   EOSABS 0.1 07/17/2017 1259   BASOSABS 0.0 07/16/2022 1011   BASOSABS 0.0 07/17/2017 1259      ASSESSMENT AND PLAN:  81 year old man with:  1.  Lymphocytosis diagnosed in 2002.  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    The natural course of this disease was reviewed at this time and treatment options were discussed.  Laboratory data from today continues to show normal CBC with mild elevation in his lymphocytes.  At this time, I see no indication for treatment at this time.  His disease has been indolent without any major changes.  I recommended annual CBC monitoring at this time which he is currently receiving under the care of his primary care  physician.  If he develops rapid rise in his lymphocyte count as well as white cell count, reevaluation would be required.  2.  Autoimmune cytopenias: No evidence of any autoimmune issues noted.     3. Follow-up: We will be as needed in the future.  30  minutes were dedicated to this visit.  The time was spent on updating his disease status, treatment choices and outlining future plan of care discussion.   Zola Button MD 07/16/22

## 2022-09-11 DIAGNOSIS — L821 Other seborrheic keratosis: Secondary | ICD-10-CM | POA: Diagnosis not present

## 2022-09-11 DIAGNOSIS — L570 Actinic keratosis: Secondary | ICD-10-CM | POA: Diagnosis not present

## 2022-09-11 DIAGNOSIS — D225 Melanocytic nevi of trunk: Secondary | ICD-10-CM | POA: Diagnosis not present

## 2022-09-11 DIAGNOSIS — D492 Neoplasm of unspecified behavior of bone, soft tissue, and skin: Secondary | ICD-10-CM | POA: Diagnosis not present

## 2022-09-11 DIAGNOSIS — L538 Other specified erythematous conditions: Secondary | ICD-10-CM | POA: Diagnosis not present

## 2022-09-11 DIAGNOSIS — L814 Other melanin hyperpigmentation: Secondary | ICD-10-CM | POA: Diagnosis not present

## 2022-09-12 DIAGNOSIS — H353132 Nonexudative age-related macular degeneration, bilateral, intermediate dry stage: Secondary | ICD-10-CM | POA: Diagnosis not present

## 2022-09-12 DIAGNOSIS — H43811 Vitreous degeneration, right eye: Secondary | ICD-10-CM | POA: Diagnosis not present

## 2022-09-12 DIAGNOSIS — H43822 Vitreomacular adhesion, left eye: Secondary | ICD-10-CM | POA: Diagnosis not present

## 2022-10-08 ENCOUNTER — Other Ambulatory Visit: Payer: Self-pay | Admitting: Internal Medicine

## 2022-10-08 DIAGNOSIS — N1831 Chronic kidney disease, stage 3a: Secondary | ICD-10-CM

## 2022-10-21 ENCOUNTER — Other Ambulatory Visit: Payer: Self-pay | Admitting: Internal Medicine

## 2022-12-08 ENCOUNTER — Encounter: Payer: Medicare Other | Admitting: Internal Medicine

## 2022-12-11 ENCOUNTER — Other Ambulatory Visit: Payer: Self-pay | Admitting: Internal Medicine

## 2022-12-17 ENCOUNTER — Encounter: Payer: Self-pay | Admitting: Internal Medicine

## 2022-12-17 ENCOUNTER — Ambulatory Visit (INDEPENDENT_AMBULATORY_CARE_PROVIDER_SITE_OTHER): Payer: Medicare Other | Admitting: Internal Medicine

## 2022-12-17 VITALS — BP 120/78 | HR 80 | Temp 97.8°F | Ht 69.0 in | Wt 183.6 lb

## 2022-12-17 DIAGNOSIS — N1831 Chronic kidney disease, stage 3a: Secondary | ICD-10-CM

## 2022-12-17 DIAGNOSIS — E785 Hyperlipidemia, unspecified: Secondary | ICD-10-CM

## 2022-12-17 DIAGNOSIS — Z0000 Encounter for general adult medical examination without abnormal findings: Secondary | ICD-10-CM

## 2022-12-17 DIAGNOSIS — R7302 Impaired glucose tolerance (oral): Secondary | ICD-10-CM | POA: Diagnosis not present

## 2022-12-17 DIAGNOSIS — N400 Enlarged prostate without lower urinary tract symptoms: Secondary | ICD-10-CM

## 2022-12-17 DIAGNOSIS — E559 Vitamin D deficiency, unspecified: Secondary | ICD-10-CM | POA: Diagnosis not present

## 2022-12-17 DIAGNOSIS — C9110 Chronic lymphocytic leukemia of B-cell type not having achieved remission: Secondary | ICD-10-CM | POA: Diagnosis not present

## 2022-12-17 LAB — VITAMIN D 25 HYDROXY (VIT D DEFICIENCY, FRACTURES): VITD: 53.36 ng/mL (ref 30.00–100.00)

## 2022-12-17 LAB — CBC WITH DIFFERENTIAL/PLATELET
Basophils Absolute: 0 10*3/uL (ref 0.0–0.1)
Basophils Relative: 0.4 % (ref 0.0–3.0)
Eosinophils Absolute: 0.2 10*3/uL (ref 0.0–0.7)
Eosinophils Relative: 1.8 % (ref 0.0–5.0)
HCT: 45.1 % (ref 39.0–52.0)
Hemoglobin: 15.3 g/dL (ref 13.0–17.0)
Lymphocytes Relative: 45.5 % (ref 12.0–46.0)
Lymphs Abs: 4 10*3/uL (ref 0.7–4.0)
MCHC: 34 g/dL (ref 30.0–36.0)
MCV: 86.7 fl (ref 78.0–100.0)
Monocytes Absolute: 0.6 10*3/uL (ref 0.1–1.0)
Monocytes Relative: 7.2 % (ref 3.0–12.0)
Neutro Abs: 4 10*3/uL (ref 1.4–7.7)
Neutrophils Relative %: 45.1 % (ref 43.0–77.0)
Platelets: 190 10*3/uL (ref 150.0–400.0)
RBC: 5.2 Mil/uL (ref 4.22–5.81)
RDW: 13.8 % (ref 11.5–15.5)
WBC: 8.8 10*3/uL (ref 4.0–10.5)

## 2022-12-17 LAB — COMPREHENSIVE METABOLIC PANEL
ALT: 16 U/L (ref 0–53)
AST: 18 U/L (ref 0–37)
Albumin: 4.4 g/dL (ref 3.5–5.2)
Alkaline Phosphatase: 78 U/L (ref 39–117)
BUN: 14 mg/dL (ref 6–23)
CO2: 26 mEq/L (ref 19–32)
Calcium: 9.2 mg/dL (ref 8.4–10.5)
Chloride: 106 mEq/L (ref 96–112)
Creatinine, Ser: 1.19 mg/dL (ref 0.40–1.50)
GFR: 57.47 mL/min — ABNORMAL LOW (ref >60.00)
Glucose, Bld: 123 mg/dL — ABNORMAL HIGH (ref 70–99)
Potassium: 4.3 mEq/L (ref 3.5–5.1)
Sodium: 142 mEq/L (ref 135–145)
Total Bilirubin: 1.1 mg/dL (ref 0.2–1.2)
Total Protein: 6.4 g/dL (ref 6.0–8.3)

## 2022-12-17 LAB — VITAMIN B12: Vitamin B-12: 737 pg/mL (ref 211–911)

## 2022-12-17 LAB — LIPID PANEL
Cholesterol: 168 mg/dL (ref 0–200)
HDL: 52 mg/dL (ref >39.00)
LDL Cholesterol: 95 mg/dL (ref 0–99)
NonHDL: 115.89
Total CHOL/HDL Ratio: 3
Triglycerides: 105 mg/dL (ref 0.0–149.0)
VLDL: 21 mg/dL (ref 0.0–40.0)

## 2022-12-17 LAB — PSA: PSA: 2.51 ng/mL (ref 0.10–4.00)

## 2022-12-17 LAB — TSH: TSH: 2.61 u[IU]/mL (ref 0.35–5.50)

## 2022-12-17 LAB — HEMOGLOBIN A1C: Hgb A1c MFr Bld: 5.7 % (ref 4.6–6.5)

## 2022-12-17 NOTE — Progress Notes (Signed)
Established Patient Office Visit     CC/Reason for Visit: Annual preventive exam and subsequent Medicare wellness visit  HPI: Samuel Stephens is a 82 y.o. male who is coming in today for the above mentioned reasons. 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  He has been doing well.  He has routine eye and dental care.  He wears bilateral hearing aids.  All immunizations are up-to-date.  He last had a colonoscopy in 2015.   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 He has never used smokeless tobacco. He reports current alcohol use. He reports that he does not use drugs.  Allergies: No Known Allergies  Family History:  Family History  Problem Relation Age of Onset   Stroke Mother    Uterine cancer Mother    Thyroid cancer Mother    Stroke Father    Diabetes Father    Colon cancer Neg Hx    Stomach cancer Neg Hx      Current Outpatient Medications:    betamethasone dipropionate 0.05 % cream, Apply topically 2 (two) times daily., Disp: 30 g, Rfl: 0   clopidogrel (PLAVIX) 75 MG tablet, TAKE 1 TABLET BY MOUTH EVERY DAY, Disp: 90 tablet, Rfl: 0   furosemide (LASIX) 40 MG tablet, TAKE 1 TABLET BY MOUTH EVERY DAY, Disp:  90 tablet, Rfl: 1   Multiple Vitamins-Minerals (EYE VITAMINS PO), Take by mouth., Disp: , Rfl:    Omega-3 Fatty Acids (FISH OIL) 1200 MG CAPS, Take 1,200 mg by mouth every evening., Disp: , Rfl:    simvastatin (ZOCOR) 40 MG tablet, TAKE 1 TABLET BY MOUTH EVERYDAY AT BEDTIME, Disp: 90 tablet, Rfl: 0  Review of Systems:  Negative unless indicated in HPI.   Physical Exam: Vitals:   12/17/22 0816  BP: 120/78  Pulse: 80  Temp: 97.8 F (36.6 C)  TempSrc: Oral  SpO2: 96%  Weight: 183 lb 9.6 oz (83.3 kg)  Height:  (1.753 m)    Body mass index is 27.11 kg/m.   Physical Exam Vitals reviewed.  Constitutional:      General: He is not in acute distress.    Appearance: Normal appearance. He is not ill-appearing, toxic-appearing or diaphoretic.  HENT:     Head: Normocephalic.     Right Ear: Tympanic membrane, ear canal and external ear normal. There is no impacted cerumen.     Left Ear: Tympanic membrane, ear canal and external ear normal. There is no impacted cerumen.     Nose: Nose normal.     Mouth/Throat:     Mouth: Mucous membranes are moist.     Pharynx: Oropharynx is clear. No oropharyngeal exudate or posterior oropharyngeal  erythema.  Eyes:     General: No scleral icterus.       Right eye: No discharge.        Left eye: No discharge.     Conjunctiva/sclera: Conjunctivae normal.     Pupils: Pupils are equal, round, and reactive to light.  Neck:     Vascular: No carotid bruit.  Cardiovascular:     Rate and Rhythm: Normal rate and regular rhythm.     Pulses: Normal pulses.     Heart sounds: Normal heart sounds.  Pulmonary:     Effort: Pulmonary effort is normal. No respiratory distress.     Breath sounds: Normal breath sounds.  Abdominal:     General: Abdomen is flat. Bowel sounds are normal.     Palpations: Abdomen is soft.  Musculoskeletal:        General: Normal range of motion.     Cervical back: Normal range of motion.  Skin:    General: Skin is warm and  dry.  Neurological:     General: No focal deficit present.     Mental Status: He is alert and oriented to person, place, and time. Mental status is at baseline.  Psychiatric:        Mood and Affect: Mood normal.        Behavior: Behavior normal.        Thought Content: Thought content normal.        Judgment: Judgment normal.      Subsequent Medicare wellness visit   1. Risk factors, based on past  M,S,F - Cardiac Risk Factors include: advanced age (>54men, >52 women);dyslipidemia   2.  Physical activities: Dietary issues and exercise activities discussed:  Current Exercise Habits: Home exercise routine, Type of exercise: walking, Time (Minutes): 30, Frequency (Times/Week): 5, Weekly Exercise (Minutes/Week): 150, Intensity: Moderate   3.  Depression/mood:  Flowsheet Row Office Visit from 12/17/2022 in [facility] at [facility] Total Score 0        4.  ADL's:    12/17/2022    8:08 AM  In your present state of health, do you have any difficulty performing the following activities:  Hearing? 1  Comment wears hearing aids  Vision? 0  Difficulty concentrating or making decisions? 0  Walking or climbing stairs? 0  Dressing or bathing? 0  Doing errands, shopping? 0  Preparing Food and eating ? N  Using the Toilet? N  In the past six months, have you accidently leaked urine? N  Do you have problems with loss of bowel control? N  Managing your Medications? N  Managing your Finances? N  Housekeeping or managing your Housekeeping? N     5.  Fall risk:     09/16/2019    8:37 AM 08/22/2020    2:00 PM 10/24/2020    8:23 AM 11/28/2021    8:24 AM 12/17/2022    8:11 AM  Fall Risk  Falls in the past year? 0 0 0 0 0  Was there an injury with Fall? 0  0 0 0  Fall Risk Category Calculator 0  0 0 0  Fall Risk Category (Retired) Low  Low Low   (RETIRED) Patient Fall Risk Level    Low fall risk   Patient at Risk for Falls Due to    No Fall Risks No Fall Risks   Fall risk Follow up    Falls evaluation completed Falls evaluation completed     6.  Home  safety: No problems identified   7.  Height weight, and visual acuity: height and weight as above, vision/hearing: Vision Screening   Right eye Left eye Both eyes  Without correction  With correction        8.  Counseling: Counseling given: Not Answered    9. Lab orders based on risk factors: Laboratory update will be reviewed   10. Cognitive assessment:        12/17/2022    8:12 AM  6CIT Screen  What Year? 0 points  What month? 0 points  What time? 0 points  Count back from 20 0 points  Months in reverse 0 points  Repeat phrase 0 points  Total Score 0 points     11. Screening: Patient provided with a written and personalized 5-10 year screening schedule in the AVS. Health Maintenance  Topic Date Due   COVID-19 Vaccine (8 - 2023-24 season) 01/02/2023*   Flu Shot  04/02/2023   Medicare Annual Wellness Visit  12/17/2023   DTaP/Tdap/Td vaccine (4 - Td or Tdap) 05/16/2030   Pneumonia Vaccine  Completed   Zoster (Shingles) Vaccine  Completed   HPV Vaccine  Aged Out   Colon Cancer Screening  Discontinued  *Topic was postponed. The date shown is not the original due date.    12. Provider List Update: Patient Care Team    Relationship Specialty Notifications Start End  Philip Aspen, Limmie Patricia, MD PCP - General Internal Medicine  07/20/18   Iva Boop, MD  Gastroenterology  11/10/11   Pierce Crane, MD (Inactive)  Hematology and Oncology  11/10/11      13. Advance Directives: Does Patient Have a Medical Advance Directive?: Yes Type of Advance Directive: Living will, Healthcare Power of Attorney, Out of facility DNR (pink MOST or yellow form) Does patient want to make changes to medical advance directive?: No - Patient declined Copy of Healthcare Power of Attorney in Chart?: No - copy requested  14. Opioids: Patient is not on any opioid prescriptions  and has no risk factors for a substance use disorder.   15.   Goals      Protect My Health     Timeframe:  Long-Range Goal Priority:  Medium Start Date:                             Expected End Date:                       Follow Up Date 12/17/2023    - schedule and keep appointment for annual check-up    Why is this important?   Screening tests can find diseases early when they are easier to treat.  Your doctor or nurse will talk with you about which tests are important for you.  Getting shots for common diseases like the flu and shingles will help prevent them.     Notes:          I have personally reviewed and noted the following in the patient's chart:   Medical and social history Use of alcohol, tobacco or illicit drugs  Current medications and supplements Functional ability and status Nutritional status Physical activity Advanced directives List of other physicians Hospitalizations, surgeries, and ER visits in previous 12 months Vitals Screenings to include cognitive, depression, and falls Referrals and appointments  In addition, I have reviewed and discussed with patient certain preventive protocols, quality metrics,  and best practice recommendations. A written personalized care plan for preventive services as well as general preventive health recommendations were provided to patient.  Impression and Plan:  Medicare annual wellness visit, subsequent  Encounter for preventive health examination - Plan: PSA  Vitamin D deficiency - Plan: Vitamin D, 25-hydroxy  Impaired glucose tolerance - Plan: Hemoglobin A1c  Stage 3a chronic kidney disease - Plan: Comprehensive metabolic panel  Benign prostatic hyperplasia without lower urinary tract symptoms  Dyslipidemia - Plan: Lipid panel  Chronic lymphocytic leukemia - Plan: CBC with Differential/Platelet, TSH, Vitamin B12  -Recommend routine eye and dental care. -Healthy lifestyle discussed in detail. -Labs  to be updated today. -Prostate cancer screening: PSA today Health Maintenance  Topic Date Due   COVID-19 Vaccine (8 - 2023-24 season) 01/02/2023*   Flu Shot  04/02/2023   Medicare Annual Wellness Visit  12/17/2023   DTaP/Tdap/Td vaccine (4 - Td or Tdap) 05/16/2030   Pneumonia Vaccine  Completed   Zoster (Shingles) Vaccine  Completed   HPV Vaccine  Aged Out   Colon Cancer Screening  Discontinued  *Topic was postponed. The date shown is not the original due date.         Chaya Jan, MD [facility] at [facility]

## 2023-01-19 ENCOUNTER — Other Ambulatory Visit: Payer: Self-pay | Admitting: Internal Medicine

## 2023-03-03 DIAGNOSIS — Z961 Presence of intraocular lens: Secondary | ICD-10-CM | POA: Diagnosis not present

## 2023-03-03 DIAGNOSIS — H43811 Vitreous degeneration, right eye: Secondary | ICD-10-CM | POA: Diagnosis not present

## 2023-03-03 DIAGNOSIS — H43822 Vitreomacular adhesion, left eye: Secondary | ICD-10-CM | POA: Diagnosis not present

## 2023-03-03 DIAGNOSIS — H353132 Nonexudative age-related macular degeneration, bilateral, intermediate dry stage: Secondary | ICD-10-CM | POA: Diagnosis not present

## 2023-03-09 ENCOUNTER — Other Ambulatory Visit: Payer: Self-pay | Admitting: Internal Medicine

## 2023-03-12 DIAGNOSIS — D225 Melanocytic nevi of trunk: Secondary | ICD-10-CM | POA: Diagnosis not present

## 2023-03-12 DIAGNOSIS — L570 Actinic keratosis: Secondary | ICD-10-CM | POA: Diagnosis not present

## 2023-03-12 DIAGNOSIS — L814 Other melanin hyperpigmentation: Secondary | ICD-10-CM | POA: Diagnosis not present

## 2023-03-12 DIAGNOSIS — S80861A Insect bite (nonvenomous), right lower leg, initial encounter: Secondary | ICD-10-CM | POA: Diagnosis not present

## 2023-03-12 DIAGNOSIS — L298 Other pruritus: Secondary | ICD-10-CM | POA: Diagnosis not present

## 2023-03-12 DIAGNOSIS — L918 Other hypertrophic disorders of the skin: Secondary | ICD-10-CM | POA: Diagnosis not present

## 2023-03-12 DIAGNOSIS — L821 Other seborrheic keratosis: Secondary | ICD-10-CM | POA: Diagnosis not present

## 2023-09-04 DIAGNOSIS — H353132 Nonexudative age-related macular degeneration, bilateral, intermediate dry stage: Secondary | ICD-10-CM | POA: Diagnosis not present

## 2023-09-04 DIAGNOSIS — Z961 Presence of intraocular lens: Secondary | ICD-10-CM | POA: Diagnosis not present

## 2023-09-04 DIAGNOSIS — H43811 Vitreous degeneration, right eye: Secondary | ICD-10-CM | POA: Diagnosis not present

## 2023-09-04 DIAGNOSIS — H43822 Vitreomacular adhesion, left eye: Secondary | ICD-10-CM | POA: Diagnosis not present

## 2023-09-21 DIAGNOSIS — L814 Other melanin hyperpigmentation: Secondary | ICD-10-CM | POA: Diagnosis not present

## 2023-09-21 DIAGNOSIS — Z08 Encounter for follow-up examination after completed treatment for malignant neoplasm: Secondary | ICD-10-CM | POA: Diagnosis not present

## 2023-09-21 DIAGNOSIS — L570 Actinic keratosis: Secondary | ICD-10-CM | POA: Diagnosis not present

## 2023-09-21 DIAGNOSIS — D225 Melanocytic nevi of trunk: Secondary | ICD-10-CM | POA: Diagnosis not present

## 2023-09-21 DIAGNOSIS — L821 Other seborrheic keratosis: Secondary | ICD-10-CM | POA: Diagnosis not present

## 2023-09-21 DIAGNOSIS — Z85828 Personal history of other malignant neoplasm of skin: Secondary | ICD-10-CM | POA: Diagnosis not present

## 2023-09-25 DIAGNOSIS — H353132 Nonexudative age-related macular degeneration, bilateral, intermediate dry stage: Secondary | ICD-10-CM | POA: Diagnosis not present

## 2023-09-25 DIAGNOSIS — H52223 Regular astigmatism, bilateral: Secondary | ICD-10-CM | POA: Diagnosis not present

## 2023-09-25 DIAGNOSIS — H5203 Hypermetropia, bilateral: Secondary | ICD-10-CM | POA: Diagnosis not present

## 2023-10-14 ENCOUNTER — Encounter: Payer: Self-pay | Admitting: Internal Medicine

## 2023-10-14 ENCOUNTER — Ambulatory Visit (INDEPENDENT_AMBULATORY_CARE_PROVIDER_SITE_OTHER): Payer: Medicare Other | Admitting: Internal Medicine

## 2023-10-14 VITALS — BP 120/84 | HR 90 | Temp 97.5°F | Wt 185.8 lb

## 2023-10-14 DIAGNOSIS — H6123 Impacted cerumen, bilateral: Secondary | ICD-10-CM | POA: Diagnosis not present

## 2023-10-14 NOTE — Progress Notes (Signed)
     Established Patient Office Visit     CC/Reason for Visit: Cerumen impaction  HPI: Samuel Stephens is a 83 y.o. male who is coming in today for the above mentioned reasons.  He has been having difficulty hearing and went to see his audiologist who told him he had bilateral cerumen impaction, here today for removal.  Otherwise feeling well.   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 He has never used smokeless tobacco. He reports current alcohol use. He reports that he does not use drugs.  Allergies: No Known Allergies  Family History:  Family History  Problem Relation Age of Onset   Stroke Mother    Uterine cancer Mother    Thyroid cancer Mother    Stroke Father    Diabetes Father    Colon cancer Neg Hx    Stomach cancer Neg Hx      Current Outpatient Medications:    betamethasone dipropionate 0.05 % cream, Apply topically 2 (two) times daily., Disp: 30 g, Rfl: 0   clopidogrel (PLAVIX) 75 MG tablet, TAKE 1 TABLET BY MOUTH EVERY DAY, Disp: 90 tablet, Rfl: 1   furosemide (LASIX) 40 MG tablet, TAKE 1 TABLET BY MOUTH EVERY DAY, Disp: 90 tablet, Rfl: 1   Multiple Vitamins-Minerals (EYE VITAMINS PO), Take by mouth., Disp: , Rfl:    Omega-3 Fatty Acids (FISH OIL) 1200 MG CAPS, Take 1,200 mg by mouth every evening., Disp: , Rfl:    simvastatin  (ZOCOR) 40 MG tablet, TAKE 1 TABLET BY MOUTH EVERYDAY AT BEDTIME, Disp: 90 tablet, Rfl: 1  Review of Systems:  Negative unless indicated in HPI.   Physical Exam: Vitals:   10/14/23 0959  BP: 120/84  Pulse: 90  Temp: (!) 97.5 F (36.4 C)  TempSrc: Oral  SpO2: 98%  Weight: 185 lb 12.8 oz (84.3 kg)    Body mass index is 27.44 kg/m.   Physical Exam HENT:     Right Ear: There is impacted cerumen.     Left Ear: There is impacted cerumen.      Impression and Plan:  Bilateral hearing loss due to cerumen impaction   Cerumen Desimpaction  -After patient consent was obtained, warm water was applied and gentle ear lavage performed on bilateral ears. There were no complications and following the desimpaction the tympanic membranes were visible. Tympanic membranes are intact following the procedure. Auditory canals are normal. The patient reported relief of symptoms after removal of cerumen.   Time spent:23 minutes reviewing chart, interviewing and examining patient and formulating plan of care.     Chaya Jan, MD [facility] at [facility]

## 2023-10-28 ENCOUNTER — Encounter: Payer: Medicare Other | Admitting: Internal Medicine

## 2023-10-29 ENCOUNTER — Telehealth: Payer: Self-pay | Admitting: Internal Medicine

## 2023-10-29 MED ORDER — CLOPIDOGREL BISULFATE 75 MG PO TABS: 75.0000 mg | ORAL_TABLET | Freq: Every day | ORAL | 0 refills | Status: DC

## 2023-10-29 MED ORDER — SIMVASTATIN 40 MG PO TABS: ORAL_TABLET | ORAL | 0 refills | Status: DC

## 2023-10-29 NOTE — Telephone Encounter (Signed)
 Refills sent

## 2023-10-29 NOTE — Telephone Encounter (Signed)
 Pt sent mychart message requesting refill of:  simvastatin (ZOCOR) 40 MG tablet, pt states he is out of this  clopidogrel (PLAVIX) 75 MG tablet, pt states he has a few left.   [id number] [address] Phone: [phone]  Fax: [phone]      Please advise at [phone]

## 2023-12-07 ENCOUNTER — Ambulatory Visit (INDEPENDENT_AMBULATORY_CARE_PROVIDER_SITE_OTHER): Admitting: Internal Medicine

## 2023-12-07 ENCOUNTER — Ambulatory Visit: Payer: Self-pay

## 2023-12-07 ENCOUNTER — Encounter: Payer: Self-pay | Admitting: Internal Medicine

## 2023-12-07 VITALS — BP 130/80 | HR 84 | Temp 98.1°F | Wt 189.7 lb

## 2023-12-07 DIAGNOSIS — L03115 Cellulitis of right lower limb: Secondary | ICD-10-CM | POA: Diagnosis not present

## 2023-12-07 MED ORDER — DOXYCYCLINE HYCLATE 100 MG PO TABS: 100.0000 mg | ORAL_TABLET | Freq: Two times a day (BID) | ORAL | 0 refills | Status: AC

## 2023-12-07 NOTE — Telephone Encounter (Signed)
 Was seen in the office 12/07/23.

## 2023-12-07 NOTE — Progress Notes (Signed)
 Established Patient Office Visit     CC/Reason for Visit: Insect bite over the right lower leg  HPI: Samuel Stephens is a 83 y.o. male who is coming in today for the above mentioned reasons.  This occurred 5 days ago.  He was out in the yard mowing his grass.  The anterior part of his lower leg was very itchy.  Over the course of the past few days it has become erythematous and swollen.  No fever or general malaise.  His children urged him to come see me today.   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 He has never used smokeless tobacco. He reports current alcohol use. He reports that he does not use drugs.  Allergies: No Known Allergies  Family History:  Family History  Problem Relation Age of Onset   Stroke Mother    Uterine cancer Mother    Thyroid cancer Mother    Stroke Father    Diabetes Father    Colon cancer Neg Hx    Stomach cancer Neg Hx      Current Outpatient Medications:    betamethasone dipropionate 0.05 % cream, Apply topically 2 (two) times daily., Disp: 30 g, Rfl: 0   clopidogrel (PLAVIX) 75 MG tablet, Take 1 tablet (75 mg total) by mouth daily., Disp: 90 tablet, Rfl: 0   doxycycline (VIBRA-TABS) 100 MG tablet, Take 1 tablet (100 mg total) by mouth 2 (two) times daily for 10 days., Disp: 20 tablet, Rfl: 0   furosemide  (LASIX) 40 MG tablet, TAKE 1 TABLET BY MOUTH EVERY DAY, Disp: 90 tablet, Rfl: 1   Multiple Vitamins-Minerals (EYE VITAMINS PO), Take by mouth., Disp: , Rfl:    Omega-3 Fatty Acids (FISH OIL) 1200 MG CAPS, Take 1,200 mg by mouth every evening., Disp: , Rfl:    simvastatin (ZOCOR) 40 MG tablet, TAKE 1 TABLET BY MOUTH EVERYDAY AT BEDTIME, Disp: 90 tablet, Rfl: 0  Review of Systems:  Negative unless indicated in HPI.   Physical Exam: Vitals:   12/07/23 0953  BP: 130/80  Pulse: 84  Temp: 98.1 F (36.7 C)  TempSrc: Oral  SpO2: 98%  Weight: 189 lb 11.2 oz (86 kg)    Body mass index is 28.01 kg/m.   Physical Exam Musculoskeletal:     Comments: Bilateral chronic lower extremity edema.  Right lower leg is erythematous.      Impression and Plan:  Cellulitis of right lower extremity -     Doxycycline Hyclate; Take 1 tablet (100 mg total) by mouth 2 (two) times daily for 10 days.  Dispense: 20 tablet; Refill: 0  -Suspect this to be an insect bite, possibly spider with surrounding cellulitis.  Treat with doxycycline for 7 days.  He knows to follow-up with me if it fails to improve  or worsens.   Time spent:30 minutes reviewing chart, interviewing and examining patient and formulating plan of care.     Chaya Jan, MD [facility] at [facility]

## 2023-12-07 NOTE — Telephone Encounter (Signed)
 Chief Complaint: Rash/Bite on leg Symptoms: see notes Frequency: since Wednesday Pertinent Negatives: Patient denies difficulty breathing, hives Disposition: [] ED /[] Urgent Care (no appt availability in office) / [x] Appointment(In office/virtual)/ []  [facility]/ [] Home Care/ [] Refused Recommended Disposition /[] [facility]/ []  Follow-up with PCP Additional Notes: Patient called in stating he noticed a rash on his leg on Wednesday that was pink. Patient states it has worsened and his daughters were in town on Saturday and stated he needed to call a doctor immediately on Monday to be evaluated, stating it looked more like a spider bite that is worsening. Patient denies pain and itching but states it is changing in color and looks worse. Appt made for today for evaluation.    Copied from CRM [id number]. Topic: Clinical - Red Word Triage >> Dec 07, 2023  8:58 AM Carlatta H wrote: [facility] that prompted transfer to Nurse Triage: Patient has a bug bite on his right leg above his ankle//The bite mark is now changing colors//Red and gray// Reason for Disposition  Bite starts to look bad (e.g., blister, purplish skin, ulcer)  (Exception: There is just minor swelling or small red bump.)  Answer Assessment - Initial Assessment Questions 1. TYPE of INSECT: "What type of insect was it?"      Patient is unsure - stated bug bite but then stated it may be spider 2. ONSET: "When did you get bitten?"      Last Wednesday night 3. LOCATION: "Where is the insect bite located?"      On right leg above his ankle 4. REDNESS: "Is the area red or pink?" If Yes, ask: "What size is area of redness?" (inches or cm). "When did the redness start?"     At least 2 inches 5. PAIN: "Is there any pain?" If Yes, ask: "How bad is it?"  (Scale 1-10; or mild, moderate, severe)     No pain 6. ITCHING: "Does it itch?" If Yes, ask: "How bad is the itch?"    - MILD: doesn't interfere with normal activities   -  MODERATE-SEVERE: interferes with work, school, sleep, or other activities      No 7. SWELLING: "How big is the swelling?" (inches, cm, or compare to coins)     Patient unsure 8. OTHER SYMPTOMS: "Do you have any other symptoms?"  (e.g., difficulty breathing, hives)     No  Protocols used: Insect Bite-A-AH

## 2023-12-10 ENCOUNTER — Telehealth: Payer: Self-pay

## 2023-12-10 NOTE — Telephone Encounter (Signed)
Patient is aware and will call back if needed. 

## 2023-12-10 NOTE — Telephone Encounter (Signed)
 Copied from CRM [id number]. Topic: Clinical - Medical Advice >> Dec 10, 2023  9:00 AM Samuel Stephens wrote: Reason for CRM: Patient called in regarding his appointment on April 7th with Dr.Hernandez stated she gave him some medication to help with his bug bite, stating he started taking the pills monday night, looked at his ankle today and its not getting any better , wanted to know what should he do would like a callback regarding this    [phone]

## 2023-12-15 DIAGNOSIS — I872 Venous insufficiency (chronic) (peripheral): Secondary | ICD-10-CM | POA: Diagnosis not present

## 2023-12-22 ENCOUNTER — Encounter: Payer: Self-pay | Admitting: Internal Medicine

## 2023-12-22 ENCOUNTER — Ambulatory Visit (INDEPENDENT_AMBULATORY_CARE_PROVIDER_SITE_OTHER): Payer: Medicare Other | Admitting: Internal Medicine

## 2023-12-22 VITALS — BP 110/70 | HR 65 | Temp 97.4°F | Ht 68.5 in | Wt 180.0 lb

## 2023-12-22 DIAGNOSIS — Z0000 Encounter for general adult medical examination without abnormal findings: Secondary | ICD-10-CM

## 2023-12-22 DIAGNOSIS — C9110 Chronic lymphocytic leukemia of B-cell type not having achieved remission: Secondary | ICD-10-CM

## 2023-12-22 DIAGNOSIS — N1831 Chronic kidney disease, stage 3a: Secondary | ICD-10-CM

## 2023-12-22 DIAGNOSIS — R7302 Impaired glucose tolerance (oral): Secondary | ICD-10-CM | POA: Diagnosis not present

## 2023-12-22 DIAGNOSIS — E785 Hyperlipidemia, unspecified: Secondary | ICD-10-CM | POA: Diagnosis not present

## 2023-12-22 DIAGNOSIS — E559 Vitamin D deficiency, unspecified: Secondary | ICD-10-CM

## 2023-12-22 DIAGNOSIS — N400 Enlarged prostate without lower urinary tract symptoms: Secondary | ICD-10-CM

## 2023-12-22 LAB — COMPREHENSIVE METABOLIC PANEL WITH GFR
ALT: 30 U/L (ref 0–53)
AST: 24 U/L (ref 0–37)
Albumin: 4.2 g/dL (ref 3.5–5.2)
Alkaline Phosphatase: 75 U/L (ref 39–117)
BUN: 21 mg/dL (ref 6–23)
CO2: 28 meq/L (ref 19–32)
Calcium: 9.2 mg/dL (ref 8.4–10.5)
Chloride: 102 meq/L (ref 96–112)
Creatinine, Ser: 1.33 mg/dL (ref 0.40–1.50)
GFR: 49.93 mL/min — ABNORMAL LOW (ref >60.00)
Glucose, Bld: 125 mg/dL — ABNORMAL HIGH (ref 70–99)
Potassium: 4.4 meq/L (ref 3.5–5.1)
Sodium: 140 meq/L (ref 135–145)
Total Bilirubin: 0.9 mg/dL (ref 0.2–1.2)
Total Protein: 6.4 g/dL (ref 6.0–8.3)

## 2023-12-22 LAB — CBC WITH DIFFERENTIAL/PLATELET
Basophils Absolute: 0 10*3/uL (ref 0.0–0.1)
Basophils Relative: 0.3 % (ref 0.0–3.0)
Eosinophils Absolute: 0.1 10*3/uL (ref 0.0–0.7)
Eosinophils Relative: 0.5 % (ref 0.0–5.0)
HCT: 46.9 % (ref 39.0–52.0)
Hemoglobin: 15.6 g/dL (ref 13.0–17.0)
Lymphocytes Relative: 42.2 % (ref 12.0–46.0)
Lymphs Abs: 6.2 10*3/uL — ABNORMAL HIGH (ref 0.7–4.0)
MCHC: 33.2 g/dL (ref 30.0–36.0)
MCV: 88 fl (ref 78.0–100.0)
Monocytes Absolute: 0.8 10*3/uL (ref 0.1–1.0)
Monocytes Relative: 5.8 % (ref 3.0–12.0)
Neutro Abs: 7.5 10*3/uL (ref 1.4–7.7)
Neutrophils Relative %: 51.2 % (ref 43.0–77.0)
Platelets: 234 10*3/uL (ref 150.0–400.0)
RBC: 5.33 Mil/uL (ref 4.22–5.81)
RDW: 13.6 % (ref 11.5–15.5)
WBC: 14.7 10*3/uL — ABNORMAL HIGH (ref 4.0–10.5)

## 2023-12-22 LAB — LIPID PANEL
Cholesterol: 162 mg/dL (ref 0–200)
HDL: 56.3 mg/dL (ref >39.00)
LDL Cholesterol: 87 mg/dL (ref 0–99)
NonHDL: 106.05
Total CHOL/HDL Ratio: 3
Triglycerides: 96 mg/dL (ref 0.0–149.0)
VLDL: 19.2 mg/dL (ref 0.0–40.0)

## 2023-12-22 LAB — PSA: PSA: 3.54 ng/mL (ref 0.10–4.00)

## 2023-12-22 LAB — VITAMIN D 25 HYDROXY (VIT D DEFICIENCY, FRACTURES): VITD: 56.29 ng/mL (ref 30.00–100.00)

## 2023-12-22 LAB — HEMOGLOBIN A1C: Hgb A1c MFr Bld: 5.9 % (ref 4.6–6.5)

## 2023-12-22 NOTE — Progress Notes (Signed)
 Established Patient Office Visit     CC/Reason for Visit: Annual preventive exam and subsequent Medicare wellness visit  HPI: Samuel Stephens is a 83 y.o. male who is coming in today for the above mentioned reasons. 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  He has lower extremity pedal edema that has been present for years and managed with as needed furosemide .  He also wears compression stockings.  He follows with dermatology every 6 months, follows with dentist and ophthalmologist every 6 months as well.   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 He has never used smokeless tobacco. He reports current alcohol use. He reports that he does not use drugs.  Allergies: No Known Allergies  Family History:  Family History  Problem Relation Age of Onset   Stroke Mother    Uterine cancer Mother    Thyroid  cancer Mother    Stroke Father    Diabetes Father    Colon cancer Neg Hx    Stomach cancer Neg Hx      Current Outpatient Medications:    betamethasone  dipropionate 0.05 % cream, Apply topically 2 (two) times daily., Disp: 30 g, Rfl: 0   clopidogrel  (PLAVIX ) 75 MG tablet, Take 1 tablet (75 mg total) by mouth daily., Disp: 90 tablet, Rfl: 0    furosemide  (LASIX ) 40 MG tablet, TAKE 1 TABLET BY MOUTH EVERY DAY, Disp: 90 tablet, Rfl: 1   Multiple Vitamins-Minerals (EYE VITAMINS PO), Take by mouth., Disp: , Rfl:    Omega-3 Fatty Acids (FISH OIL) 1200 MG CAPS, Take 1,200 mg by mouth every evening., Disp: , Rfl:    simvastatin  (ZOCOR ) 40 MG tablet, TAKE 1 TABLET BY MOUTH EVERYDAY AT BEDTIME, Disp: 90 tablet, Rfl: 0  Review of Systems:  Negative unless indicated in HPI.   Physical Exam: Vitals:   12/22/23 1330  BP: 110/70  Pulse: 65  Temp: (!) 97.4 F (36.3 C)  TempSrc: Oral  SpO2: 99%  Weight: 180 lb (81.6 kg)  Height: 5' 8.5" (1.74 m)    Body mass index is 26.97 kg/m.   Physical Exam Vitals reviewed.  Constitutional:      General: He is not in acute distress.    Appearance: Normal appearance. He is not ill-appearing, toxic-appearing or diaphoretic.  HENT:     Head: Normocephalic.     Right Ear: Tympanic membrane, ear canal and external ear normal. There is no impacted cerumen.     Left Ear: Tympanic membrane, ear canal and external ear normal. There is no impacted cerumen.     Nose: Nose normal.     Mouth/Throat:     Mouth: Mucous membranes are moist.     Pharynx: Oropharynx is clear. No oropharyngeal  exudate or posterior oropharyngeal erythema.  Eyes:     General: No scleral icterus.       Right eye: No discharge.        Left eye: No discharge.     Conjunctiva/sclera: Conjunctivae normal.     Pupils: Pupils are equal, round, and reactive to light.  Neck:     Vascular: No carotid bruit.  Cardiovascular:     Rate and Rhythm: Normal rate and regular rhythm.     Pulses: Normal pulses.     Heart sounds: Normal heart sounds.  Pulmonary:     Effort: Pulmonary effort is normal. No respiratory distress.     Breath sounds: Normal breath sounds.  Abdominal:     General: Abdomen is flat. Bowel sounds are normal.     Palpations: Abdomen is soft.  Musculoskeletal:        General: Swelling present.     Cervical  back: Normal range of motion.  Skin:    General: Skin is warm and dry.  Neurological:     General: No focal deficit present.     Mental Status: He is alert and oriented to person, place, and time. Mental status is at baseline.  Psychiatric:        Mood and Affect: Mood normal.        Behavior: Behavior normal.        Thought Content: Thought content normal.        Judgment: Judgment normal.     Subsequent Medicare wellness visit   1. Risk factors, based on past  M,S,F - Cardiac Risk Factors include: advanced age (>65men, >90 women)   2.  Physical activities: Dietary issues and exercise activities discussed:      3.  Depression/mood:  Flowsheet Row Clinical Support from 12/22/2023 in [facility] Total Score 6        4.  ADL's:    12/22/2023    1:24 PM  In your present state of health, do you have any difficulty performing the following activities:  Hearing? 1  Vision? 0  Difficulty concentrating or making decisions? 1  Walking or climbing stairs? 0  Dressing or bathing? 0  Doing errands, shopping? 0  Preparing Food and eating ? N  Using the Toilet? N  In the past six months, have you accidently leaked urine? Y  Do you have problems with loss of bowel control? N  Managing your Medications? N  Managing your Finances? N  Housekeeping or managing your Housekeeping? N     5.  Fall risk:     11/28/2021    8:24 AM 12/17/2022    8:11 AM 10/14/2023   10:00 AM 12/07/2023    9:55 AM 12/22/2023    1:26 PM  Fall Risk  Falls in the past year? 0 0 0 0 0  Was there an injury with Fall? 0 0 0 0 0  Fall Risk Category Calculator 0 0 0 0 0  Fall Risk Category (Retired) Low      (RETIRED) Patient Fall Risk Level Low fall risk      Patient at Risk for Falls Due to No Fall Risks No Fall Risks     Fall risk Follow up Falls evaluation completed Falls evaluation completed Falls evaluation completed Falls evaluation completed Falls evaluation  completed     6.  Home safety: No problems identified   7.  Height weight, and visual acuity: height and weight as above, vision/hearing:  Vision Screening   Right eye Left eye Both eyes  Without correction 20/25 20/25 20/25   With correction        8.  Counseling: Counseling given: Not Answered    9. Lab orders based on risk factors: Laboratory update will be reviewed   10. Cognitive assessment:        12/22/2023    1:27 PM 12/17/2022    8:12 AM  6CIT Screen  What Year? 0 points 0 points  What month? 0 points 0 points  What time? 0 points 0 points  Count back from 20 0 points 0 points  Months in reverse 0 points 0 points  Repeat phrase 0 points 0 points  Total Score 0 points 0 points     11. Screening: Patient provided with a written and personalized 5-10 year screening schedule in the AVS. Health Maintenance  Topic Date Due   COVID-19 Vaccine (8 - Pfizer risk 2024-25 season) 11/09/2023   Flu Shot  04/01/2024   Medicare Annual Wellness Visit  12/21/2024   DTaP/Tdap/Td vaccine (4 - Td or Tdap) 05/16/2030   Pneumonia Vaccine  Completed   Zoster (Shingles) Vaccine  Completed   HPV Vaccine  Aged Out   Meningitis B Vaccine  Aged Out   Colon Cancer Screening  Discontinued   Hepatitis C Screening  Discontinued    12. Provider List Update: Patient Care Team    Relationship Specialty Notifications Start End  Zilphia Hilt, Charyl Coppersmith, MD PCP - General Internal Medicine  07/20/18   Kenney Peacemaker, MD  Gastroenterology  11/10/11   Bartley Borrow, MD (Inactive)  Hematology and Oncology  11/10/11      13. Advance Directives: Does Patient Have a Medical Advance Directive?: Yes Type of Advance Directive: Living will, Healthcare Power of Attorney, Out of facility DNR (pink MOST or yellow form) Does patient want to make changes to medical advance directive?: No - Patient declined Copy of Healthcare Power of Attorney in Chart?: No - copy requested  14. Opioids: Patient is not  on any opioid prescriptions and has no risk factors for a substance use disorder.   15.   Goals      Manage My Emotions-Pediatric     Timeframe:   Priority:   Start Date:                             Expected End Date:                       Follow Up Date   - call and visit an old friend    Why is this important?   When you are stressed, down or upset your body reacts too.  For example, your blood pressure may get higher; you may have a headache or stomachache.  When your emotions get the best of you, your body's ability to fight off cold and flu gets weak.  These steps will help you manage emotions.     Notes:      Protect My Health     Timeframe:  Long-Range Goal Priority:  Medium Start Date:                             Expected End Date:                       Follow  Up Date 12/17/2023    - schedule and keep appointment for annual check-up    Why is this important?   Screening tests can find diseases early when they are easier to treat.  Your doctor or nurse will talk with you about which tests are important for you.  Getting shots for common diseases like the flu and shingles will help prevent them.     Notes:          I have personally reviewed and noted the following in the patient's chart:   Medical and social history Use of alcohol, tobacco or illicit drugs  Current medications and supplements Functional ability and status Nutritional status Physical activity Advanced directives List of other physicians Hospitalizations, surgeries, and ER visits in previous 12 months Vitals Screenings to include cognitive, depression, and falls Referrals and appointments  In addition, I have reviewed and discussed with patient Stephens preventive protocols, quality metrics, and best practice recommendations. A written personalized care plan for preventive services as well as general preventive health recommendations were provided to patient.  Impression and  Plan:  Medicare annual wellness visit, subsequent  Encounter for preventive health examination -     PSA; Future  Vitamin D  deficiency -     VITAMIN D  25 Hydroxy (Vit-D Deficiency, Fractures); Future  Impaired glucose tolerance -     Hemoglobin A1c; Future  Stage 3a chronic kidney disease (HCC) -     Comprehensive metabolic panel with GFR; Future  Benign prostatic hyperplasia without lower urinary tract symptoms  Dyslipidemia -     Lipid panel; Future  Chronic lymphocytic leukemia (HCC) -     CBC with Differential/Platelet; Future   -Recommend routine eye and dental care. -Healthy lifestyle discussed in detail. -Labs to be updated today. -Prostate cancer screening: PSA today Health Maintenance  Topic Date Due   COVID-19 Vaccine (8 - Pfizer risk 2024-25 season) 11/09/2023   Flu Shot  04/01/2024   Medicare Annual Wellness Visit  12/21/2024   DTaP/Tdap/Td vaccine (4 - Td or Tdap) 05/16/2030   Pneumonia Vaccine  Completed   Zoster (Shingles) Vaccine  Completed   HPV Vaccine  Aged Out   Meningitis B Vaccine  Aged Out   Colon Cancer Screening  Discontinued   Hepatitis C Screening  Discontinued        Wendolyn Raso Zilphia Hilt, MD [facility]

## 2023-12-23 ENCOUNTER — Encounter: Payer: Self-pay | Admitting: Internal Medicine

## 2024-01-22 DIAGNOSIS — M67912 Unspecified disorder of synovium and tendon, left shoulder: Secondary | ICD-10-CM | POA: Diagnosis not present

## 2024-01-26 ENCOUNTER — Other Ambulatory Visit: Payer: Self-pay | Admitting: Internal Medicine

## 2024-03-07 DIAGNOSIS — H43811 Vitreous degeneration, right eye: Secondary | ICD-10-CM | POA: Diagnosis not present

## 2024-03-07 DIAGNOSIS — Z961 Presence of intraocular lens: Secondary | ICD-10-CM | POA: Diagnosis not present

## 2024-03-07 DIAGNOSIS — H43822 Vitreomacular adhesion, left eye: Secondary | ICD-10-CM | POA: Diagnosis not present

## 2024-03-07 DIAGNOSIS — H353132 Nonexudative age-related macular degeneration, bilateral, intermediate dry stage: Secondary | ICD-10-CM | POA: Diagnosis not present

## 2024-03-21 DIAGNOSIS — D225 Melanocytic nevi of trunk: Secondary | ICD-10-CM | POA: Diagnosis not present

## 2024-03-21 DIAGNOSIS — L708 Other acne: Secondary | ICD-10-CM | POA: Diagnosis not present

## 2024-03-21 DIAGNOSIS — L814 Other melanin hyperpigmentation: Secondary | ICD-10-CM | POA: Diagnosis not present

## 2024-03-21 DIAGNOSIS — L570 Actinic keratosis: Secondary | ICD-10-CM | POA: Diagnosis not present

## 2024-03-21 DIAGNOSIS — L821 Other seborrheic keratosis: Secondary | ICD-10-CM | POA: Diagnosis not present

## 2024-03-22 NOTE — Progress Notes (Signed)
   03/22/2024  Patient ID: Samuel Stephens, male   DOB: 02/13/1941, 83 y.o.   MRN: [id number]  Pharmacy Quality Measure Review  This patient is appearing on a report for being at risk of failing the adherence measure for cholesterol (statin) medications this calendar year.   Medication: Simvastatin  Last fill date: 03/10/24 for 90 day supply  Insurance report was not up to date. No action needed at this time.   Jon VEAR Lindau, PharmD Clinical Pharmacist [phone]

## 2024-05-06 ENCOUNTER — Other Ambulatory Visit: Payer: Self-pay | Admitting: Internal Medicine

## 2024-05-11 ENCOUNTER — Encounter: Payer: Self-pay | Admitting: Internal Medicine

## 2024-05-11 DIAGNOSIS — Z1211 Encounter for screening for malignant neoplasm of colon: Secondary | ICD-10-CM

## 2024-05-11 DIAGNOSIS — H6123 Impacted cerumen, bilateral: Secondary | ICD-10-CM

## 2024-05-18 NOTE — Addendum Note (Signed)
 Addended by: KATHRYNE MILLMAN B on: 05/18/2024 03:20 PM   Modules accepted: Orders

## 2024-05-22 LAB — COLOGUARD

## 2024-05-30 DIAGNOSIS — M7062 Trochanteric bursitis, left hip: Secondary | ICD-10-CM | POA: Diagnosis not present

## 2024-06-08 DIAGNOSIS — Z1211 Encounter for screening for malignant neoplasm of colon: Secondary | ICD-10-CM | POA: Diagnosis not present

## 2024-06-13 LAB — COLOGUARD: COLOGUARD: NEGATIVE

## 2024-06-14 ENCOUNTER — Ambulatory Visit: Payer: Self-pay | Admitting: Internal Medicine

## 2024-08-18 ENCOUNTER — Other Ambulatory Visit: Payer: Self-pay | Admitting: Internal Medicine
